# Patient Record
Sex: Male | Born: 1955 | Race: White | Hispanic: No | Marital: Married | Smoking: Never smoker
Health system: Southern US, Community
[De-identification: ages and names within clinical notes are randomized; demographics above are authoritative.]

## PROBLEM LIST (undated history)

## (undated) DIAGNOSIS — E785 Hyperlipidemia, unspecified: Secondary | ICD-10-CM

## (undated) DIAGNOSIS — J45909 Unspecified asthma, uncomplicated: Secondary | ICD-10-CM

## (undated) DIAGNOSIS — I10 Essential (primary) hypertension: Secondary | ICD-10-CM

## (undated) DIAGNOSIS — R7303 Prediabetes: Secondary | ICD-10-CM

## (undated) DIAGNOSIS — R519 Headache, unspecified: Secondary | ICD-10-CM

## (undated) DIAGNOSIS — E559 Vitamin D deficiency, unspecified: Secondary | ICD-10-CM

## (undated) HISTORY — PX: ORIF ACETABULAR FRACTURE: SHX5029

## (undated) HISTORY — DX: Unspecified asthma, uncomplicated: J45.909

## (undated) HISTORY — DX: Essential (primary) hypertension: I10

## (undated) HISTORY — PX: KNEE SURGERY: SHX244

## (undated) HISTORY — PX: ESOPHAGOGASTRODUODENOSCOPY: SHX1529

## (undated) HISTORY — DX: Vitamin D deficiency, unspecified: E55.9

## (undated) HISTORY — DX: Hyperlipidemia, unspecified: E78.5

## (undated) HISTORY — DX: Prediabetes: R73.03

---

## 1998-05-08 ENCOUNTER — Encounter: Payer: Self-pay | Admitting: Internal Medicine

## 1998-05-08 ENCOUNTER — Ambulatory Visit (HOSPITAL_COMMUNITY): Admission: RE | Admit: 1998-05-08 | Discharge: 1998-05-08 | Payer: Self-pay | Admitting: Internal Medicine

## 1999-07-31 ENCOUNTER — Encounter: Payer: Self-pay | Admitting: Internal Medicine

## 1999-07-31 ENCOUNTER — Ambulatory Visit (HOSPITAL_COMMUNITY): Admission: RE | Admit: 1999-07-31 | Discharge: 1999-07-31 | Payer: Self-pay | Admitting: Internal Medicine

## 1999-08-04 ENCOUNTER — Encounter: Payer: Self-pay | Admitting: Internal Medicine

## 1999-08-04 ENCOUNTER — Ambulatory Visit (HOSPITAL_COMMUNITY): Admission: RE | Admit: 1999-08-04 | Discharge: 1999-08-04 | Payer: Self-pay | Admitting: Internal Medicine

## 2001-01-12 ENCOUNTER — Encounter: Admission: RE | Admit: 2001-01-12 | Discharge: 2001-01-12 | Payer: Self-pay | Admitting: Otolaryngology

## 2001-01-12 ENCOUNTER — Encounter: Payer: Self-pay | Admitting: Otolaryngology

## 2003-08-07 ENCOUNTER — Ambulatory Visit (HOSPITAL_COMMUNITY): Admission: RE | Admit: 2003-08-07 | Discharge: 2003-08-07 | Payer: Self-pay | Admitting: Internal Medicine

## 2003-08-15 ENCOUNTER — Ambulatory Visit (HOSPITAL_COMMUNITY): Admission: RE | Admit: 2003-08-15 | Discharge: 2003-08-15 | Payer: Self-pay | Admitting: Internal Medicine

## 2003-09-05 ENCOUNTER — Ambulatory Visit (HOSPITAL_COMMUNITY): Admission: RE | Admit: 2003-09-05 | Discharge: 2003-09-05 | Payer: Self-pay | Admitting: Cardiology

## 2007-05-20 ENCOUNTER — Ambulatory Visit: Payer: Self-pay | Admitting: Gastroenterology

## 2007-06-03 ENCOUNTER — Ambulatory Visit: Payer: Self-pay | Admitting: Gastroenterology

## 2007-06-03 LAB — HM COLONOSCOPY

## 2010-08-22 NOTE — Cardiovascular Report (Signed)
NAME:  Gerald [name], Gerald [name]                         ACCOUNT NO.:  [id number]   MEDICAL RECORD NO.:  [id number]                   PATIENT TYPE:  OIB   LOCATION:  2858                                 FACILITY:  [facility]   PHYSICIAN:  Thereasa Solo. Little, M.D.              DATE OF BIRTH:  09/27/1955   DATE OF PROCEDURE:  09/05/2003  DATE OF DISCHARGE:  09/05/2003                              CARDIAC CATHETERIZATION   PROCEDURES PERFORMED:   CARDIOLOGIST:  Gaspar Garbe B. Little, M.D.   INDICATIONS FOR TEST:  Mr. Missey is a 55 year old male who has had  episodes of shortness of breath and has had episodes of chest discomfort.  They seem to be more noticeable when he is less active.  He has had no  evidence of any arrhythmias.  The night prior to this evaluation he woke up  and had a 45-minute episode of severe chest discomfort with shortness of  breath.   A nuclear study was performed on Aug 28, 2003 that showed a 60% ejection  fraction and lateral wall ischemia.  The patient, however, on the treadmill  was able to exercise for a total of 10 minutes 30 seconds.   EQUIPMENT:  Five French Judkins Configuration catheters.   CONTRAST MATERIAL:  Total contrast; 100 mL.   COMPLICATIONS:  None.   DESCRIPTION OF PROCEDURE:  The patient was prepped and draped in the usual  sterile fashion exposing the right groin.  Following local anesthesia with  1% Xylocaine the Seldinger technique was employed and a 5 [place] introducer  sheath was placed into the right femoral artery.  Left and right coronary  arteriography and ventriculography in the RAO projection was performed.  A  distal aortogram was performed because of his history of hypertension.   RESULTS:   I HEMODYNAMIC MONITORING:  Central aortic pressure was 101/70.  Left  ventricular pressure was 109/16 and there was a 7 mm gradient noted at the  time of pullback.   II VENTRICULOGRAPHY:  Ventriculography in the RAO projection revealed normal  left  ventricular systolic function.  No mitral regurgitation.  Ejection  fraction was greater than 55%.   III DISTAL AORTOGRAM:  Distal aortogram done just above the levels of the  renal arteries showed no evidence of abdominal aortic aneurysm and no  evidence of renal artery stenosis.  The lumen of the distal aorta appeared  to be smooth.   IV CORONARY ARTERIOGRAPHY:  On fluoroscopy there was mild-to-moderate  calcification seen in the proximal circumflex and LAD.   1. Left Main:  Normal.   1. LAD:  The LAD crossed the apex of the heart and gave rise to a first     diagonal branch.  Both the LAD and the diagonal system were free of     disease.   1. Circumflex:  The circumflex was a large vessel at about 4 mm.  It gave  rise to three OM vessels, all of which were free of disease.   1. Right Coronary Artery:  The right coronary artery gave rise to a PDA and     a s,mall posterolateral vessel, all of which were free of disease.   CONCLUSION:  1. Normal left ventricular systolic function.  2. No renal artery stenosis.  3. No evidence of occlusive coronary disease; however, there was mild-to-     moderate calcification in the proximal portion of the circumflex and left     anterior descending.   DISCUSSION:  At this point I cannot explain his symptoms based on his  coronary anatomy.  I will defer back to Dr. Oneta Rack and would suggest a GI  evaluation and perhaps even PFTs; however, the way he describes some of his  symptoms may imply that he is having panic attacks.                                               Thereasa Solo. Little, M.D.    ABL/MEDQ  D:  09/05/2003  T:  09/06/2003  Job:  [id number]   cc:   Catheterization Laboratory   Lucky Cowboy, M.D.  [address]  [place], [place] [address]  Fax: [phone]

## 2013-02-09 ENCOUNTER — Other Ambulatory Visit: Payer: Self-pay | Admitting: Internal Medicine

## 2013-04-05 ENCOUNTER — Encounter: Payer: Self-pay | Admitting: Internal Medicine

## 2013-04-11 ENCOUNTER — Encounter: Payer: Self-pay | Admitting: Physician Assistant

## 2013-04-11 ENCOUNTER — Ambulatory Visit (INDEPENDENT_AMBULATORY_CARE_PROVIDER_SITE_OTHER): Payer: 59 | Admitting: Physician Assistant

## 2013-04-11 VITALS — BP 122/72 | HR 76 | Temp 98.2°F | Resp 16 | Ht 68.0 in | Wt 195.0 lb

## 2013-04-11 DIAGNOSIS — Z79899 Other long term (current) drug therapy: Secondary | ICD-10-CM

## 2013-04-11 DIAGNOSIS — I10 Essential (primary) hypertension: Secondary | ICD-10-CM | POA: Insufficient documentation

## 2013-04-11 DIAGNOSIS — R7303 Prediabetes: Secondary | ICD-10-CM

## 2013-04-11 DIAGNOSIS — E291 Testicular hypofunction: Secondary | ICD-10-CM

## 2013-04-11 DIAGNOSIS — E785 Hyperlipidemia, unspecified: Secondary | ICD-10-CM

## 2013-04-11 DIAGNOSIS — E559 Vitamin D deficiency, unspecified: Secondary | ICD-10-CM

## 2013-04-11 DIAGNOSIS — R7309 Other abnormal glucose: Secondary | ICD-10-CM

## 2013-04-11 DIAGNOSIS — E782 Mixed hyperlipidemia: Secondary | ICD-10-CM | POA: Insufficient documentation

## 2013-04-11 LAB — LIPID PANEL
Cholesterol: 144 mg/dL (ref 0–200)
HDL: 46 mg/dL (ref >39)
LDL Cholesterol: 75 mg/dL (ref 0–99)
Total CHOL/HDL Ratio: 3.1 Ratio
Triglycerides: 116 mg/dL (ref <150)
VLDL: 23 mg/dL (ref 0–40)

## 2013-04-11 LAB — MAGNESIUM: Magnesium: 2.3 mg/dL (ref 1.5–2.5)

## 2013-04-11 LAB — BASIC METABOLIC PANEL WITH GFR
BUN: 22 mg/dL (ref 6–23)
CO2: 25 mEq/L (ref 19–32)
Calcium: 9.5 mg/dL (ref 8.4–10.5)
Chloride: 102 mEq/L (ref 96–112)
Creat: 1.15 mg/dL (ref 0.50–1.35)
GFR, Est African American: 82 mL/min
GFR, Est Non African American: 71 mL/min
Glucose, Bld: 87 mg/dL (ref 70–99)
Potassium: 4.7 mEq/L (ref 3.5–5.3)
Sodium: 138 mEq/L (ref 135–145)

## 2013-04-11 LAB — HEPATIC FUNCTION PANEL
ALT: 40 U/L (ref 0–53)
AST: 39 U/L — ABNORMAL HIGH (ref 0–37)
Albumin: 4.8 g/dL (ref 3.5–5.2)
Alkaline Phosphatase: 78 U/L (ref 39–117)
Bilirubin, Direct: 0.1 mg/dL (ref 0.0–0.3)
Indirect Bilirubin: 0.3 mg/dL (ref 0.0–0.9)
Total Bilirubin: 0.4 mg/dL (ref 0.3–1.2)
Total Protein: 6.9 g/dL (ref 6.0–8.3)

## 2013-04-11 NOTE — Progress Notes (Signed)
HPI Patient presents for 3 month follow up with hypertension, hyperlipidemia, prediabetes and vitamin D. Patient's blood pressure has been controlled at home, today their BP is BP: 122/72 mmHg  Patient denies chest pain, shortness of breath, dizziness.  Patient's cholesterol is diet controlled. In addition they are on crestor 40  and denies myalgias. The cholesterol last visit was LDL 74.  The patient has been working on diet and exercise for prediabetes, he goes to the [facility] and runs, and denies changes in vision, polys, and paresthesias. A1C 5.7 (5.4)  Patient is on Vitamin D supplement.  Vitamin D was 91 PSA was elevated and he needs a repeat PSA next visit.  Uses miralax for constipation.  Current Medications:  Current Outpatient Prescriptions on File Prior to Visit  Medication Sig Dispense Refill  . Cholecalciferol (VITAMIN D-3) 5000 UNITS TABS Take 5,000 Units by mouth daily.      Marland Kitchen CIALIS 5 MG tablet TAKE 1 TABLET DAILY FOR    PROSTATISM  90 tablet  3  . enalapril (VASOTEC) 20 MG tablet Take 20 mg by mouth daily.      . Flaxseed, Linseed, (FLAX SEED OIL PO) Take by mouth.      . magnesium 30 MG tablet Take 30 mg by mouth 2 (two) times daily.      . meloxicam (MOBIC) 15 MG tablet Take 15 mg by mouth as needed for pain.      . rosuvastatin (CRESTOR) 40 MG tablet Take 20 mg by mouth daily.      . Testosterone (FORTESTA TD) Place onto the skin daily.      . vitamin B-12 (CYANOCOBALAMIN) 100 MCG tablet Take 100 mcg by mouth daily.       No current facility-administered medications on file prior to visit.   Medical History:  Past Medical History  Diagnosis Date  . Hypertension   . Hyperlipidemia   . Vitamin D deficiency   . Pre-diabetes   . Hypogonadism, male    Allergies: No Known Allergies  ROS Constitutional: Denies fever, chills, headaches, insomnia, fatigue, night sweats Eyes: Denies redness, blurred vision, diplopia, discharge, itchy, watery eyes.  ENT: Denies congestion,  post nasal drip, sore throat, earache, dental pain, Tinnitus, Vertigo, Sinus pain, snoring.  Cardio: Denies chest pain, palpitations, irregular heartbeat, dyspnea, diaphoresis, orthopnea, PND, claudication, edema Respiratory: denies cough, shortness of breath, wheezing.  Gastrointestinal: + constipation Denies dysphagia, heartburn, AB pain/ cramps, N/V, diarrhea,  hematemesis, melena, hematochezia,  hemorrhoids Genitourinary: Denies dysuria, frequency, urgency, nocturia, hesitancy, discharge, hematuria, flank pain Musculoskeletal: Denies myalgia, stiffness, pain, swelling and strain/sprain. Skin: Denies pruritis, rash, changing in skin lesion Neuro: Denies Weakness, tremor, incoordination, spasms, pain Psychiatric: Denies confusion, memory loss, sensory loss Endocrine: Denies change in weight, skin, hair change, nocturia Diabetic Polys, Denies visual blurring, hyper /hypo glycemic episodes, and paresthesia, Heme/Lymph: Denies Excessive bleeding, bruising, enlarged lymph nodes  Family history- Review and unchanged Social history- Review and unchanged Physical Exam: Filed Vitals:   04/11/13 1358  BP: 122/72  Pulse: 76  Temp: 98.2 F (36.8 C)  Resp: 16   Filed Weights   04/11/13 1358  Weight: 195 lb (88.451 kg)   General Appearance: Well nourished, in no apparent distress. Eyes: PERRLA, EOMs, conjunctiva no swelling or erythema Sinuses: No Frontal/maxillary tenderness ENT/Mouth: Ext aud canals clear, TMs without erythema, bulging. No erythema, swelling, or exudate on post pharynx.  Tonsils not swollen or erythematous. Hearing normal.  Neck: Supple, thyroid normal.  Respiratory: Respiratory effort normal, BS  equal bilaterally without rales, rhonchi, wheezing or stridor.  Cardio: RRR with no MRGs. Brisk peripheral pulses without edema.  Abdomen: Soft, + BS.  Non tender, no guarding, rebound, hernias, masses. Lymphatics: Non tender without lymphadenopathy.  Musculoskeletal: Full ROM,  5/5 strength, normal gait.  Skin: Warm, dry without rashes, lesions, ecchymosis.  Neuro: Cranial nerves intact. Normal muscle tone, no cerebellar symptoms. Sensation intact.  Psych: Awake and oriented X 3, normal affect, Insight and Judgment appropriate.   Assessment and Plan:  Hypertension: Continue medication, monitor blood pressure at home.  Continue DASH diet. Cholesterol: Continue diet and exercise. Check cholesterol.  Pre-diabetes-Continue diet and exercise. Check A1C Vitamin D Def- check level and continue medications.  Elevated PSA- check PSA next visit, denies LUTs symptoms Constipation- add benefiber, increase water.   Continue diet and meds as discussed. Further disposition pending results of labs.  Vicie Mutters 2:07 PM

## 2013-04-11 NOTE — Patient Instructions (Signed)
Add benefiber or generic equivalent for constipation. 1-2 TBSP daily.     Bad carbs also include fruit juice, alcohol, and sweet tea. These are empty calories that do not signal to your brain that you are full.   Please remember the good carbs are still carbs which convert into sugar. So please measure them out no more than 1/2-1 cup of rice, oatmeal, pasta, and beans.  Veggies are however free foods! Pile them on.   I like lean protein at every meal such as chicken, [place], pork chops, cottage cheese, etc. Just do not fry these meats and please center your meal around vegetable, the meats should be a side dish.   No all fruit is created equal. Please see the list below, the fruit at the bottom is higher in sugars than the fruit at the top

## 2013-04-12 LAB — CBC WITH DIFFERENTIAL/PLATELET
Basophils Absolute: 0 10*3/uL (ref 0.0–0.1)
Basophils Relative: 1 % (ref 0–1)
Eosinophils Absolute: 0.1 10*3/uL (ref 0.0–0.7)
Eosinophils Relative: 1 % (ref 0–5)
HCT: 46.5 % (ref 39.0–52.0)
Hemoglobin: 16 g/dL (ref 13.0–17.0)
Lymphocytes Relative: 12 % (ref 12–46)
Lymphs Abs: 1.1 10*3/uL (ref 0.7–4.0)
MCH: 29 pg (ref 26.0–34.0)
MCHC: 34.4 g/dL (ref 30.0–36.0)
MCV: 84.2 fL (ref 78.0–100.0)
Monocytes Absolute: 0.6 10*3/uL (ref 0.1–1.0)
Monocytes Relative: 7 % (ref 3–12)
Neutro Abs: 6.9 10*3/uL (ref 1.7–7.7)
Neutrophils Relative %: 79 % — ABNORMAL HIGH (ref 43–77)
Platelets: 296 10*3/uL (ref 150–400)
RBC: 5.52 MIL/uL (ref 4.22–5.81)
RDW: 14.2 % (ref 11.5–15.5)
WBC: 8.8 10*3/uL (ref 4.0–10.5)

## 2013-04-12 LAB — VITAMIN D 25 HYDROXY (VIT D DEFICIENCY, FRACTURES): Vit D, 25-Hydroxy: 85 ng/mL (ref 30–89)

## 2013-04-12 LAB — TSH: TSH: 1.362 u[IU]/mL (ref 0.350–4.500)

## 2013-04-12 LAB — HEMOGLOBIN A1C
Hgb A1c MFr Bld: 5.6 % (ref <5.7)
Mean Plasma Glucose: 114 mg/dL (ref <117)

## 2013-05-24 ENCOUNTER — Encounter: Payer: Self-pay | Admitting: Internal Medicine

## 2013-05-24 ENCOUNTER — Ambulatory Visit (INDEPENDENT_AMBULATORY_CARE_PROVIDER_SITE_OTHER): Payer: 59 | Admitting: Internal Medicine

## 2013-05-24 VITALS — BP 130/82 | HR 48 | Temp 98.4°F | Resp 18 | Wt 198.2 lb

## 2013-05-24 DIAGNOSIS — Z79899 Other long term (current) drug therapy: Secondary | ICD-10-CM

## 2013-05-24 DIAGNOSIS — M779 Enthesopathy, unspecified: Principal | ICD-10-CM

## 2013-05-24 DIAGNOSIS — D2110 Benign neoplasm of connective and other soft tissue of unspecified upper limb, including shoulder: Secondary | ICD-10-CM

## 2013-05-24 DIAGNOSIS — M65839 Other synovitis and tenosynovitis, unspecified forearm: Secondary | ICD-10-CM

## 2013-05-24 DIAGNOSIS — Z7689 Persons encountering health services in other specified circumstances: Secondary | ICD-10-CM | POA: Insufficient documentation

## 2013-05-24 DIAGNOSIS — M778 Other enthesopathies, not elsewhere classified: Secondary | ICD-10-CM

## 2013-05-24 DIAGNOSIS — D2111 Benign neoplasm of connective and other soft tissue of right upper limb, including shoulder: Secondary | ICD-10-CM

## 2013-05-24 DIAGNOSIS — M65849 Other synovitis and tenosynovitis, unspecified hand: Secondary | ICD-10-CM

## 2013-05-24 MED ORDER — PREDNISONE 20 MG PO TABS: 20.0000 mg | ORAL_TABLET | ORAL | Status: DC

## 2013-05-24 NOTE — Progress Notes (Signed)
   Subjective:    Patient ID: Gerald [name], male    DOB: Mar 16, 1956, 58 y.o.   MRN: [id number]  HPI Pt c/o soreness of the Rt ring finger for 1-2 weeks. Does have very remote non displaced fx at the base of or proximal finger which is not tender.  Review of Systems Musculo-systems review otherwise negative.  Objective:   Physical Exam  Focused exam of the Rt ring finger finds normal flexion-extension with slight tenderness over the distal dorsal finger.there is also an approx. 10 x 10 x 4 mm soft to firm mobile non tender lesion along the medial proximal Rt 4th finger base.  Assessment & Plan:  1. Tendonitis of finger  - predniSONE (DELTASONE) 20 MG tablet; Take 1 tablet (20 mg total) by mouth See admin instructions. 1 tab 3 x day for 3 days, then 1 tab 2 x day for 3 days, then 1 tab 1 x day for 5 days  Dispense: 20 tablet; Refill: 0  2. Fibroma of finger of right hand  Advised if Sx's not resolve - can refer to orthopedist

## 2013-06-12 ENCOUNTER — Other Ambulatory Visit: Payer: Self-pay | Admitting: Internal Medicine

## 2013-07-12 ENCOUNTER — Ambulatory Visit (INDEPENDENT_AMBULATORY_CARE_PROVIDER_SITE_OTHER): Payer: 59 | Admitting: Internal Medicine

## 2013-07-12 ENCOUNTER — Encounter: Payer: Self-pay | Admitting: Internal Medicine

## 2013-07-12 VITALS — BP 102/68 | HR 64 | Temp 98.1°F | Resp 18 | Ht 68.75 in | Wt 194.4 lb

## 2013-07-12 DIAGNOSIS — E785 Hyperlipidemia, unspecified: Secondary | ICD-10-CM

## 2013-07-12 DIAGNOSIS — Z79899 Other long term (current) drug therapy: Secondary | ICD-10-CM

## 2013-07-12 DIAGNOSIS — R7309 Other abnormal glucose: Secondary | ICD-10-CM

## 2013-07-12 DIAGNOSIS — I10 Essential (primary) hypertension: Secondary | ICD-10-CM

## 2013-07-12 DIAGNOSIS — E559 Vitamin D deficiency, unspecified: Secondary | ICD-10-CM

## 2013-07-12 DIAGNOSIS — R7303 Prediabetes: Secondary | ICD-10-CM

## 2013-07-12 DIAGNOSIS — E291 Testicular hypofunction: Secondary | ICD-10-CM

## 2013-07-12 LAB — CBC WITH DIFFERENTIAL/PLATELET
Basophils Absolute: 0 10*3/uL (ref 0.0–0.1)
Basophils Relative: 1 % (ref 0–1)
Eosinophils Absolute: 0.1 10*3/uL (ref 0.0–0.7)
Eosinophils Relative: 3 % (ref 0–5)
HCT: 43.9 % (ref 39.0–52.0)
Hemoglobin: 15 g/dL (ref 13.0–17.0)
Lymphocytes Relative: 28 % (ref 12–46)
Lymphs Abs: 1.3 10*3/uL (ref 0.7–4.0)
MCH: 28.8 pg (ref 26.0–34.0)
MCHC: 34.2 g/dL (ref 30.0–36.0)
MCV: 84.3 fL (ref 78.0–100.0)
Monocytes Absolute: 0.5 10*3/uL (ref 0.1–1.0)
Monocytes Relative: 11 % (ref 3–12)
Neutro Abs: 2.7 10*3/uL (ref 1.7–7.7)
Neutrophils Relative %: 57 % (ref 43–77)
Platelets: 304 10*3/uL (ref 150–400)
RBC: 5.21 MIL/uL (ref 4.22–5.81)
RDW: 14.3 % (ref 11.5–15.5)
WBC: 4.7 10*3/uL (ref 4.0–10.5)

## 2013-07-12 LAB — HEMOGLOBIN A1C
Hgb A1c MFr Bld: 5.8 % — ABNORMAL HIGH (ref <5.7)
Mean Plasma Glucose: 120 mg/dL — ABNORMAL HIGH (ref <117)

## 2013-07-12 NOTE — Patient Instructions (Signed)

## 2013-07-12 NOTE — Progress Notes (Signed)
Patient ID: Gerald [name], male   DOB: 1955-07-08, 58 y.o.   MRN: [id number]    This very nice 58 y.o. [name] presents for 3 month follow up with Hypertension, Hyperlipidemia, Pre-Diabetes, Testosterone and Vitamin D Deficiency.    HTN predates since 1999. BP has been controlled at home. Today's BP: 102/68 mmHg. Periodic BP checks at [facility] range about 125/80. Patient had a negative Ht Cath in 2005 after a [place]. Patient denies any cardiac type chest pain, palpitations, dyspnea/orthopnea/PND, dizziness, claudication, or dependent edema.   Hyperlipidemia is controlled with diet & meds. Last Cholesterol was 144, Triglycerides were 116, HDL 46 and LDL 75 in Jan 2015 - at goal. Patient denies myalgias or other med SE's.    Also, the patient has history of PreDiabetes since Apr 2011  with last A1c of  5.6% in Jan 2015. Patient denies any symptoms of reactive hypoglycemia, diabetic polys, paresthesias or visual blurring.   Other problems include Testosterone Deficiency with patient reporting improved sense of well being on replacement therapy. Further, Patient has history of Vitamin D Deficiency with vitamin D of 91 in Oct 2014. Patient supplements vitamin D without any suspected side-effects.  Medication Sig  . chlorhexidine (PERIDEX) 0.12 % solution   . Cholecalciferol (VITAMIN D-3) 5000 UNITS TABS Take 5,000 Units by mouth daily.  Marland Kitchen CIALIS 5 MG tablet TAKE 1 TABLET DAILY FOR    PROSTATISM  . Flaxseed, Linseed, (FLAX SEED OIL PO) Take by mouth.  . magnesium 30 MG tablet Take 30 mg by mouth 2 (two) times daily.  . meloxicam (MOBIC) 15 MG tablet Take 15 mg by mouth as needed for pain.  . moexipril (UNIVASC) 15 MG tablet TAKE 1 TABLET DAILY FOR    BLOOD PRESSURE  . Omega-3 Fatty Acids (FISH OIL PO) Take by mouth daily.  . pregabalin (LYRICA) 200 MG capsule Take 200 mg by mouth 2 (two) times daily.  . rosuvastatin (CRESTOR) 40 MG tablet Take 20 mg by mouth daily.  .  Testosterone (FORTESTA TD) Place onto the skin daily.  . vitamin B-12 (CYANOCOBALAMIN) 100 MCG tablet Take 100 mcg by mouth daily.  Marland Kitchen ZOLMitriptan (ZOMIG) 2.5 MG tablet Take 2.5 mg by mouth once. Pt not sure of mg    No Known Allergies  PMHx:   Past Medical History  Diagnosis Date  . Hypertension   . Hyperlipidemia   . Vitamin D deficiency   . Pre-diabetes   . Hypogonadism, male     FHx:    Reviewed / unchanged  SHx:    Reviewed / unchanged   Systems Review: Constitutional: Denies fever, chills, wt changes, headaches, insomnia, fatigue, night sweats, change in appetite. Eyes: Denies redness, blurred vision, diplopia, discharge, itchy, watery eyes.  ENT: Denies discharge, congestion, post nasal drip, epistaxis, sore throat, earache, hearing loss, dental pain, tinnitus, vertigo, sinus pain, snoring.  CV: Denies chest pain, palpitations, irregular heartbeat, syncope, dyspnea, diaphoresis, orthopnea, PND, claudication, edema. Respiratory: denies cough, dyspnea, DOE, pleurisy, hoarseness, laryngitis, wheezing.  Gastrointestinal: Denies dysphagia, odynophagia, heartburn, reflux, water brash, abdominal pain or cramps, nausea, vomiting, bloating, diarrhea, constipation, hematemesis, melena, hematochezia,  or hemorrhoids. Genitourinary: Denies dysuria, frequency, urgency, nocturia, hesitancy, discharge, hematuria, flank pain. Musculoskeletal: Denies arthralgias, myalgias, stiffness, jt. swelling, pain, limp, strain/sprain.  Skin: Denies pruritus, rash, hives, warts, acne, eczema, change in skin lesion(s). Neuro: No weakness, tremor, incoordination, spasms, paresthesia, or pain. Psychiatric: Denies confusion, memory loss, or sensory loss. Endo: Denies change in weight,  skin, hair change.  Heme/Lymph: No excessive bleeding, bruising, orenlarged lymph nodes.   Exam:  BP 102/68  Pulse 64  Temp(Src) 98.1 F (36.7 C) (Temporal)  Resp 18  Ht 5' 8.75" (1.746 m)  Wt 194 lb 6.4 oz (88.179  kg)  BMI 28.93 kg/m2  Appears well nourished - in no distress. Eyes: PERRLA, EOMs, conjunctiva no swelling or erythema. Sinuses: No frontal/maxillary tenderness ENT/Mouth: EAC's clear, TM's nl w/o erythema, bulging. Nares clear w/o erythema, swelling, exudates. Oropharynx clear without erythema or exudates. Oral hygiene is good. Tongue normal, non obstructing. Hearing intact.  Neck: Supple. Thyroid nl. Car 2+/2+ without bruits, nodes or JVD. Chest: Respirations nl with BS clear & equal w/o rales, rhonchi, wheezing or stridor.  Cor: Heart sounds normal w/ regular rate and rhythm without sig. murmurs, gallops, clicks, or rubs. Peripheral pulses normal and equal  without edema.  Abdomen: Soft & bowel sounds normal. Non-tender w/o guarding, rebound, hernias, masses, or organomegaly.  Lymphatics: Unremarkable.  Musculoskeletal: Full ROM all peripheral extremities, joint stability, 5/5 strength, and normal gait.  Skin: Warm, dry without exposed rashes, lesions, ecchymosis apparent.  Neuro: Cranial nerves intact, reflexes equal bilaterally. Sensory-motor testing grossly intact. Tendon reflexes grossly intact.  Pysch: Alert & oriented x 3. Insight and judgement nl & appropriate. No ideations.  Assessment and Plan:  1. Hypertension - Continue monitor blood pressure at home. Continue diet/meds same.  2. Hyperlipidemia - Continue diet/meds, exercise,& lifestyle modifications. Continue monitor periodic cholesterol/liver & renal functions   3. Pre-diabetes - Continue diet, exercise, lifestyle modifications. Monitor appropriate labs.  4. Vitamin D Deficiency - Continue supplementation.  5. Testosterone Deficiency - continue supplementation  Recommended regular exercise, BP monitoring, weight control, and discussed med and SE's. Recommended labs to assess and monitor clinical status. Further disposition pending results of labs.

## 2013-07-13 LAB — VITAMIN D 25 HYDROXY (VIT D DEFICIENCY, FRACTURES): Vit D, 25-Hydroxy: 77 ng/mL (ref 30–89)

## 2013-07-13 LAB — INSULIN, FASTING: Insulin fasting, serum: 14 u[IU]/mL (ref 3–28)

## 2013-07-13 LAB — MAGNESIUM: Magnesium: 2.1 mg/dL (ref 1.5–2.5)

## 2013-07-13 LAB — HEPATIC FUNCTION PANEL
ALT: 25 U/L (ref 0–53)
AST: 26 U/L (ref 0–37)
Albumin: 4.8 g/dL (ref 3.5–5.2)
Alkaline Phosphatase: 78 U/L (ref 39–117)
Bilirubin, Direct: 0.1 mg/dL (ref 0.0–0.3)
Indirect Bilirubin: 0.4 mg/dL (ref 0.2–1.2)
Total Bilirubin: 0.5 mg/dL (ref 0.2–1.2)
Total Protein: 6.9 g/dL (ref 6.0–8.3)

## 2013-07-13 LAB — BASIC METABOLIC PANEL WITH GFR
BUN: 22 mg/dL (ref 6–23)
CO2: 27 mEq/L (ref 19–32)
Calcium: 9.9 mg/dL (ref 8.4–10.5)
Chloride: 102 mEq/L (ref 96–112)
Creat: 1.01 mg/dL (ref 0.50–1.35)
GFR, Est African American: >89 mL/min
GFR, Est Non African American: 82 mL/min
Glucose, Bld: 77 mg/dL (ref 70–99)
Potassium: 4.5 mEq/L (ref 3.5–5.3)
Sodium: 140 mEq/L (ref 135–145)

## 2013-07-13 LAB — TSH: TSH: 1.088 u[IU]/mL (ref 0.350–4.500)

## 2013-07-13 LAB — LIPID PANEL
Cholesterol: 151 mg/dL (ref 0–200)
HDL: 45 mg/dL (ref >39)
LDL Cholesterol: 81 mg/dL (ref 0–99)
Total CHOL/HDL Ratio: 3.4 Ratio
Triglycerides: 127 mg/dL (ref <150)
VLDL: 25 mg/dL (ref 0–40)

## 2013-07-13 LAB — TESTOSTERONE: Testosterone: 448 ng/dL (ref 300–890)

## 2013-10-16 ENCOUNTER — Encounter: Payer: Self-pay | Admitting: Emergency Medicine

## 2013-10-16 ENCOUNTER — Ambulatory Visit (INDEPENDENT_AMBULATORY_CARE_PROVIDER_SITE_OTHER): Payer: 59 | Admitting: Emergency Medicine

## 2013-10-16 VITALS — BP 104/62 | HR 68 | Temp 98.2°F | Resp 16 | Ht 68.75 in | Wt 196.0 lb

## 2013-10-16 DIAGNOSIS — E782 Mixed hyperlipidemia: Secondary | ICD-10-CM

## 2013-10-16 DIAGNOSIS — K5900 Constipation, unspecified: Secondary | ICD-10-CM

## 2013-10-16 DIAGNOSIS — R7309 Other abnormal glucose: Secondary | ICD-10-CM

## 2013-10-16 DIAGNOSIS — I10 Essential (primary) hypertension: Secondary | ICD-10-CM

## 2013-10-16 LAB — HEPATIC FUNCTION PANEL
ALT: 24 U/L (ref 0–53)
AST: 23 U/L (ref 0–37)
Albumin: 4.5 g/dL (ref 3.5–5.2)
Alkaline Phosphatase: 71 U/L (ref 39–117)
Bilirubin, Direct: 0.1 mg/dL (ref 0.0–0.3)
Indirect Bilirubin: 0.4 mg/dL (ref 0.2–1.2)
Total Bilirubin: 0.5 mg/dL (ref 0.2–1.2)
Total Protein: 6.7 g/dL (ref 6.0–8.3)

## 2013-10-16 LAB — BASIC METABOLIC PANEL WITH GFR
BUN: 17 mg/dL (ref 6–23)
CO2: 27 mEq/L (ref 19–32)
Calcium: 9.5 mg/dL (ref 8.4–10.5)
Chloride: 104 mEq/L (ref 96–112)
Creat: 1.06 mg/dL (ref 0.50–1.35)
GFR, Est African American: >89 mL/min
GFR, Est Non African American: 78 mL/min
Glucose, Bld: 74 mg/dL (ref 70–99)
Potassium: 4.6 mEq/L (ref 3.5–5.3)
Sodium: 138 mEq/L (ref 135–145)

## 2013-10-16 LAB — CBC WITH DIFFERENTIAL/PLATELET
Basophils Absolute: 0 10*3/uL (ref 0.0–0.1)
Basophils Relative: 1 % (ref 0–1)
Eosinophils Absolute: 0.2 10*3/uL (ref 0.0–0.7)
Eosinophils Relative: 4 % (ref 0–5)
HCT: 47 % (ref 39.0–52.0)
Hemoglobin: 15.9 g/dL (ref 13.0–17.0)
Lymphocytes Relative: 29 % (ref 12–46)
Lymphs Abs: 1.3 10*3/uL (ref 0.7–4.0)
MCH: 28.9 pg (ref 26.0–34.0)
MCHC: 33.8 g/dL (ref 30.0–36.0)
MCV: 85.5 fL (ref 78.0–100.0)
Monocytes Absolute: 0.6 10*3/uL (ref 0.1–1.0)
Monocytes Relative: 13 % — ABNORMAL HIGH (ref 3–12)
Neutro Abs: 2.3 10*3/uL (ref 1.7–7.7)
Neutrophils Relative %: 53 % (ref 43–77)
Platelets: 244 10*3/uL (ref 150–400)
RBC: 5.5 MIL/uL (ref 4.22–5.81)
RDW: 13.7 % (ref 11.5–15.5)
WBC: 4.4 10*3/uL (ref 4.0–10.5)

## 2013-10-16 LAB — LIPID PANEL
Cholesterol: 129 mg/dL (ref 0–200)
HDL: 48 mg/dL (ref >39)
LDL Cholesterol: 60 mg/dL (ref 0–99)
Total CHOL/HDL Ratio: 2.7 Ratio
Triglycerides: 103 mg/dL (ref <150)
VLDL: 21 mg/dL (ref 0–40)

## 2013-10-16 LAB — HEMOGLOBIN A1C
Hgb A1c MFr Bld: 5.5 % (ref <5.7)
Mean Plasma Glucose: 111 mg/dL (ref <117)

## 2013-10-16 NOTE — Progress Notes (Signed)
Subjective:    Patient ID: Gerald [name], male    DOB: [birth year], 57 y.o.   MRN: [id number]  HPI Comments: 58 yo presents for 3 month F/U for HTN, Cholesterol, Pre-Dm, D. Deficient. He is eating healthy. He is exercising routinely. He notes BP is good at home.    He notes constipation was initially better with switching to Benefiber but notes it is worse with travel. He denies any HX of IBS or bowel problems otherwise.  WBC             4.7   07/12/2013 HGB            15.0   07/12/2013 HCT            43.9   07/12/2013 PLT             304   07/12/2013 GLUCOSE          77   07/12/2013 CHOL            151   07/12/2013 TRIG            127   07/12/2013 HDL              45   07/12/2013 LDLCALC          81   07/12/2013 ALT              25   07/12/2013 AST              26   07/12/2013 NA              140   07/12/2013 K               4.5   07/12/2013 CL              102   07/12/2013 CREATININE     1.01   07/12/2013 BUN              22   07/12/2013 CO2              27   07/12/2013 TSH           1.088   07/12/2013 HGBA1C          5.8   07/12/2013  Hyperlipidemia      Medication List       This list is accurate as of: 10/16/13  9:13 AM.  Always use your most recent med list.               ALPRAZolam 0.5 MG tablet  Commonly known as:  XANAX     chlorhexidine 0.12 % solution  Commonly known as:  PERIDEX     CIALIS 5 MG tablet  Generic drug:  tadalafil  TAKE 1 TABLET DAILY FOR    PROSTATISM     clobetasol cream 0.05 %  Commonly known as:  TEMOVATE     FISH OIL PO  Take by mouth daily.     FLAX SEED OIL PO  Take by mouth.     FORTESTA TD  Place onto the skin daily.     magnesium 30 MG tablet  Take 30 mg by mouth 2 (two) times daily.     meloxicam 15 MG tablet  Commonly known as:  MOBIC  Take 15 mg by mouth as needed for pain.     moexipril 15 MG tablet  Commonly known as:  UNIVASC  TAKE 1 TABLET DAILY FOR  BLOOD PRESSURE     pregabalin 200 MG capsule  Commonly known as:  LYRICA  Take 200  mg by mouth 2 (two) times daily.     rosuvastatin 40 MG tablet  Commonly known as:  CRESTOR  Take 20 mg by mouth daily.     vitamin B-12 100 MCG tablet  Commonly known as:  CYANOCOBALAMIN  Take 100 mcg by mouth daily.     Vitamin D-3 5000 UNITS Tabs  Take 5,000 Units by mouth daily.     ZOLMitriptan 2.5 MG tablet  Commonly known as:  ZOMIG  Take 2.5 mg by mouth once. Pt not sure of mg       No Known Allergies  Past Medical History  Diagnosis Date  . Hypertension   . Hyperlipidemia   . Vitamin D deficiency   . Pre-diabetes   . Hypogonadism, male      Review of Systems  Gastrointestinal: Positive for constipation.  All other systems reviewed and are negative.  BP 104/62  Pulse 68  Temp(Src) 98.2 F (36.8 C) (Temporal)  Resp 16  Ht 5' 8.75" (1.746 m)  Wt 196 lb (88.905 kg)  BMI 29.16 kg/m2     Objective:   Physical Exam  Nursing note and vitals reviewed. Constitutional: He is oriented to person, place, and time. He appears well-developed and well-nourished.  HENT:  Head: Normocephalic and atraumatic.  Right Ear: External ear normal.  Left Ear: External ear normal.  Nose: Nose normal.  Eyes: Conjunctivae and EOM are normal.  Neck: Normal range of motion. Neck supple. No thyromegaly present.  Cardiovascular: Normal rate, regular rhythm, normal heart sounds and intact distal pulses.   Pulmonary/Chest: Effort normal and breath sounds normal.  Abdominal: Soft. Bowel sounds are normal. He exhibits no distension and no mass. There is no tenderness. There is no rebound and no guarding.  Musculoskeletal: Normal range of motion. He exhibits no edema and no tenderness.  Lymphadenopathy:    He has no cervical adenopathy.  Neurological: He is alert and oriented to person, place, and time. No cranial nerve deficit. Coordination normal.  Skin: Skin is warm and dry.  Psychiatric: He has a normal mood and affect. His behavior is normal. Judgment and thought content normal.           Assessment & Plan:  1.  3 month F/U for HTN, Cholesterol, Pre-Dm, D. Deficient. Needs healthy diet, cardio QD and obtain healthy weight. Check Labs, Check BP if >130/80 call office   2. constipation- If no change with probiotic, consider RX constipation medication and verify next colonoscopy due date. SX [facility] given # 1 box

## 2013-10-16 NOTE — Patient Instructions (Signed)

## 2013-10-18 ENCOUNTER — Encounter: Payer: Self-pay | Admitting: Emergency Medicine

## 2013-10-18 ENCOUNTER — Other Ambulatory Visit: Payer: Self-pay | Admitting: Emergency Medicine

## 2013-10-26 ENCOUNTER — Other Ambulatory Visit: Payer: Self-pay | Admitting: *Deleted

## 2013-10-26 MED ORDER — RESTORA PO CAPS: ORAL_CAPSULE | ORAL | Status: DC

## 2013-11-30 ENCOUNTER — Other Ambulatory Visit: Payer: Self-pay | Admitting: *Deleted

## 2013-11-30 MED ORDER — ROSUVASTATIN CALCIUM 40 MG PO TABS: 40.0000 mg | ORAL_TABLET | Freq: Every day | ORAL | Status: DC

## 2014-01-07 NOTE — Progress Notes (Signed)
Patient ID: Gerald [name], male   DOB: 03/12/1956, 58 y.o.   MRN: [id number]  Annual Screening Comprehensive Examination  This very nice 58 y.o.[name] presents for complete physical.  Patient has been followed for HTN,  Prediabetes, Hyperlipidemia, Testosterone and Vitamin D Deficiency.   HTN first diagnosed in 1999. Patient's BP has been controlled at home.Today's BP: 108/80 mmHg. Patient did have a False Positive Cardiolite in 2005 vindicated by a normal heart cath. Patient does have CKD3 with GFR 69/ml/min. Patient denies any cardiac symptoms as chest pain, palpitations, shortness of breath, dizziness or ankle swelling.   Patient's hyperlipidemia is controlled with diet and medications. Patient denies myalgias or other medication SE's. Last lipids were Total Chol 129; HDL  48; LDL  60; Trig 103 on 10/16/2013.   Patient has prediabetes predating since 07/2009 and patient denies reactive hypoglycemic symptoms, visual blurring, diabetic polys or paresthesias. Last A1c was 5.5% on 10/16/2013.   Patient is on replacement Tx with Gel and reports improved sense of well being. In addition patient has been on testosterone replacement therapy with improved stamina and sense of well being. Finally, patient has history of Vitamin D Deficiency of    and last vitamin D was 77 on 07/12/2013.  Medication Sig  . ALPRAZolam (XANAX) 0.5 MG tablet   . chlorhexidine (PERIDEX) 0.12 % solution   . Cholecalciferol (VITAMIN D-3) 5000 UNITS TABS Take 5,000 Units by mouth daily.  Marland Kitchen CIALIS 5 MG tablet TAKE 1 TABLET DAILY FOR    PROSTATISM  . clobetasol cream (TEMOVATE) 0.05 %   . Flaxseed, Linseed, (FLAX SEED OIL PO) Take by mouth.  . magnesium 30 MG tablet Take 30 mg by mouth 2 (two) times daily.  . meloxicam (MOBIC) 15 MG tablet Take 15 mg by mouth as needed for pain.  . moexipril (UNIVASC) 15 MG tablet TAKE 1 TABLET DAILY FOR    BLOOD PRESSURE  . Omega-3 Fatty Acids (FISH OIL PO) Take by mouth daily.  . pregabalin  (LYRICA) 200 MG capsule Take 200 mg by mouth 2 (two) times daily.  . Probiotic Product (RESTORA) CAPS Take one pill by mouth once daily  . rosuvastatin (CRESTOR) 40 MG tablet Take 1 tablet (40 mg total) by mouth daily.  . Testosterone (FORTESTA TD) Place onto the skin daily.  . vitamin B-12100 MCG tablet Take 100 mcg by mouth daily.  Marland Kitchen ZOLMitriptan (ZOMIG) 2.5 MG tablet Take 2.5 mg by mouth once. Pt not sure of mg   No Known Allergies  Past Medical History  Diagnosis Date  . Hypertension   . Hyperlipidemia   . Vitamin D deficiency   . Pre-diabetes   . Hypogonadism, male    Past Surgical History  Procedure Laterality Date  . Knee surgery    . Esophagogastroduodenoscopy    . Orif acetabular fracture     Family History  Problem Relation Age of Onset  . Hypertension Mother   . Hyperlipidemia Mother   . Diabetes Mother   . Hypertension Father     History   Social History  . Marital Status: Married    Spouse Name: N/A    Number of Children: N/A  . Years of Education: N/A   Occupational History  . 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 [place]    Social History Main Topics  . Smoking status: Never Smoker   . Smokeless tobacco: Not on file  . Alcohol Use: 1.0 oz/week    2 drink(s) per  week  . Drug Use: No  . Sexual Activity: Active    ROS Constitutional: Denies fever, chills, weight loss/gain, headaches, insomnia, fatigue, night sweats or change in appetite. Eyes: Denies redness, blurred vision, diplopia, discharge, itchy or watery eyes.  ENT: Denies discharge, congestion, post nasal drip, epistaxis, sore throat, earache, hearing loss, dental pain, Tinnitus, Vertigo, Sinus pain or snoring.  Cardio: Denies chest pain, palpitations, irregular heartbeat, syncope, dyspnea, diaphoresis, orthopnea, PND, claudication or edema Respiratory: denies cough, dyspnea, DOE, pleurisy, hoarseness, laryngitis or wheezing.  Gastrointestinal: Denies dysphagia, heartburn, reflux,  water brash, pain, cramps, nausea, vomiting, bloating, diarrhea, constipation, hematemesis, melena, hematochezia, jaundice or hemorrhoids Genitourinary: Denies dysuria, frequency, urgency, nocturia, hesitancy, discharge, hematuria or flank pain Musculoskeletal: Denies arthralgia, myalgia, stiffness, Jt. Swelling, pain, limp or strain/sprain. Denies Falls. Skin: Denies puritis, rash, hives, warts, acne, eczema or change in skin lesion Neuro: No weakness, tremor, incoordination, spasms, paresthesia or pain Psychiatric: Denies confusion, memory loss or sensory loss. Denies Depression. Endocrine: Denies change in weight, skin, hair change, nocturia, and paresthesia, diabetic polys, visual blurring or hyper / hypo glycemic episodes.  Heme/Lymph: No excessive bleeding, bruising or enlarged lymph nodes.  Physical Exam  BP 108/80  Pulse 60  Temp 98.1 F   Resp 16  Ht 5' 8.75" Wt 197 lb 12.8 oz   BMI 29.43  General Appearance: Well nourished, in no apparent distress. Eyes: PERRLA, EOMs, conjunctiva no swelling or erythema, normal fundi and vessels. Sinuses: No frontal/maxillary tenderness ENT/Mouth: EACs patent / TMs  nl. Nares clear without erythema, swelling, mucoid exudates. Oral hygiene is good. No erythema, swelling, or exudate. Tongue normal, non-obstructing. Tonsils not swollen or erythematous. Hearing normal.  Neck: Supple, thyroid normal. No bruits, nodes or JVD. Respiratory: Respiratory effort normal.  BS equal and clear bilateral without rales, rhonci, wheezing or stridor. Cardio: Heart sounds are normal with regular rate and rhythm and no murmurs, rubs or gallops. Peripheral pulses are normal and equal bilaterally without edema. No aortic or femoral bruits. Chest: symmetric with normal excursions and percussion.  Abdomen: Flat, soft, with bowl sounds. Nontender, no guarding, rebound, hernias, masses, or organomegaly.  Lymphatics: Non tender without lymphadenopathy.  Genitourinary: No  hernias.Testes nl. DRE - prostate nl for age - smooth & firm w/o nodules. Musculoskeletal: Full ROM all peripheral extremities, joint stability, 5/5 strength, and normal gait. Skin: Warm and dry without rashes, lesions, cyanosis, clubbing or  ecchymosis.  Neuro: Cranial nerves intact, reflexes equal bilaterally. Normal muscle tone, no cerebellar symptoms. Sensation intact.  Pysch: Awake and oriented X 3with normal affect, insight and judgment appropriate.   Assessment and Plan  1. Annual Screening Examination 2. Hypertension  3. Hyperlipidemia 4. Pre Diabetes 5. Vitamin D Deficiency 6. Testosterone Deficiency   Continue prudent diet as discussed, weight control, BP monitoring, regular exercise, and medications as discussed.  Discussed med effects and SE's. Routine screening labs and tests as requested with regular follow-up as recommended.

## 2014-01-08 ENCOUNTER — Encounter: Payer: Self-pay | Admitting: Internal Medicine

## 2014-01-08 ENCOUNTER — Ambulatory Visit (INDEPENDENT_AMBULATORY_CARE_PROVIDER_SITE_OTHER): Payer: 59 | Admitting: Internal Medicine

## 2014-01-08 VITALS — BP 108/80 | HR 60 | Temp 98.1°F | Resp 16 | Ht 68.75 in | Wt 197.8 lb

## 2014-01-08 DIAGNOSIS — R7303 Prediabetes: Secondary | ICD-10-CM

## 2014-01-08 DIAGNOSIS — I10 Essential (primary) hypertension: Secondary | ICD-10-CM

## 2014-01-08 DIAGNOSIS — E559 Vitamin D deficiency, unspecified: Secondary | ICD-10-CM

## 2014-01-08 DIAGNOSIS — E785 Hyperlipidemia, unspecified: Secondary | ICD-10-CM

## 2014-01-08 DIAGNOSIS — R6889 Other general symptoms and signs: Secondary | ICD-10-CM

## 2014-01-08 DIAGNOSIS — Z1212 Encounter for screening for malignant neoplasm of rectum: Secondary | ICD-10-CM

## 2014-01-08 DIAGNOSIS — Z113 Encounter for screening for infections with a predominantly sexual mode of transmission: Secondary | ICD-10-CM

## 2014-01-08 DIAGNOSIS — R7309 Other abnormal glucose: Secondary | ICD-10-CM

## 2014-01-08 DIAGNOSIS — Z23 Encounter for immunization: Secondary | ICD-10-CM

## 2014-01-08 DIAGNOSIS — Z125 Encounter for screening for malignant neoplasm of prostate: Secondary | ICD-10-CM

## 2014-01-08 DIAGNOSIS — R748 Abnormal levels of other serum enzymes: Secondary | ICD-10-CM

## 2014-01-08 DIAGNOSIS — Z79899 Other long term (current) drug therapy: Secondary | ICD-10-CM

## 2014-01-08 DIAGNOSIS — E291 Testicular hypofunction: Secondary | ICD-10-CM

## 2014-01-08 DIAGNOSIS — Z111 Encounter for screening for respiratory tuberculosis: Secondary | ICD-10-CM

## 2014-01-08 DIAGNOSIS — Z0001 Encounter for general adult medical examination with abnormal findings: Secondary | ICD-10-CM

## 2014-01-08 LAB — CBC WITH DIFFERENTIAL/PLATELET
Basophils Absolute: 0 10*3/uL (ref 0.0–0.1)
Basophils Relative: 1 % (ref 0–1)
Eosinophils Absolute: 0.2 10*3/uL (ref 0.0–0.7)
Eosinophils Relative: 5 % (ref 0–5)
HCT: 41.5 % (ref 39.0–52.0)
Hemoglobin: 14 g/dL (ref 13.0–17.0)
Lymphocytes Relative: 23 % (ref 12–46)
Lymphs Abs: 1 10*3/uL (ref 0.7–4.0)
MCH: 29 pg (ref 26.0–34.0)
MCHC: 33.7 g/dL (ref 30.0–36.0)
MCV: 85.9 fL (ref 78.0–100.0)
Monocytes Absolute: 0.4 10*3/uL (ref 0.1–1.0)
Monocytes Relative: 10 % (ref 3–12)
Neutro Abs: 2.6 10*3/uL (ref 1.7–7.7)
Neutrophils Relative %: 61 % (ref 43–77)
Platelets: 253 10*3/uL (ref 150–400)
RBC: 4.83 MIL/uL (ref 4.22–5.81)
RDW: 14.6 % (ref 11.5–15.5)
WBC: 4.3 10*3/uL (ref 4.0–10.5)

## 2014-01-08 LAB — HEMOGLOBIN A1C
Hgb A1c MFr Bld: 5.8 % — ABNORMAL HIGH (ref <5.7)
Mean Plasma Glucose: 120 mg/dL — ABNORMAL HIGH (ref <117)

## 2014-01-08 MED ORDER — ASPIRIN EC 81 MG PO TBEC: 81.0000 mg | DELAYED_RELEASE_TABLET | Freq: Every day | ORAL | Status: AC

## 2014-01-08 NOTE — Patient Instructions (Signed)
Recommend the book "The END of DIETING" by Dr Baker Janus   and the book "The END of DIABETES " by Dr Excell Seltzer  At [email or web address] - get book & Audio CD's      Being diabetic has a  300% increased risk for heart attack, stroke, cancer, and alzheimer- type vascular dementia. It is very important that you work harder with diet by avoiding all foods that are white except chicken & fish. Avoid white rice (brown & wild rice is OK), white potatoes (sweetpotatoes in moderation is OK), White bread or wheat bread or anything made out of white flour like bagels, donuts, rolls, buns, biscuits, cakes, pastries, cookies, pizza crust, and pasta (made from white flour & egg whites) - vegetarian pasta or spinach or wheat pasta is OK. Multigrain breads like Arnold's or [employer], or multigrain sandwich thins or flatbreads.  Diet, exercise and weight loss can reverse and cure diabetes in the early stages.  Diet, exercise and weight loss is very important in the control and prevention of complications of diabetes which affects every system in your body, ie. Brain - dementia/stroke, eyes - glaucoma/blindness, heart - heart attack/heart failure, kidneys - dialysis, stomach - gastric paralysis, intestines - malabsorption, nerves - severe painful neuritis, circulation - gangrene & loss of a leg(s), and finally cancer and Alzheimers.    I recommend avoid fried & greasy foods,  sweets/candy, white rice (brown or wild rice or Quinoa is OK), white potatoes (sweet potatoes are OK) - anything made from white flour - bagels, doughnuts, rolls, buns, biscuits,white and wheat breads, pizza crust and traditional pasta made of white flour & egg white(vegetarian pasta or spinach or wheat pasta is OK).  Multi-grain bread is OK - like multi-grain flat bread or sandwich thins. Avoid alcohol in excess. Exercise is also important.    Eat all the vegetables you want - avoid meat, especially red meat and dairy - especially cheese.  Cheese  is the most concentrated form of trans-fats which is the worst thing to clog up our arteries. Veggie cheese is OK which can be found in the fresh produce section at [employer] or [employer] or [employer]  Preventive Care for Adults A healthy lifestyle and preventive care can promote health and wellness. Preventive health guidelines for men include the following key practices:  A routine yearly physical is a good way to check with your health care provider about your health and preventative screening. It is a chance to share any concerns and updates on your health and to receive a thorough exam.  Visit your dentist for a routine exam and preventative care every 6 months. Brush your teeth twice a day and floss once a day. Good oral hygiene prevents tooth decay and gum disease.  The frequency of eye exams is based on your age, health, family medical history, use of contact lenses, and other factors. Follow your health care provider's recommendations for frequency of eye exams.  Eat a healthy diet. Foods such as vegetables, fruits, whole grains, low-fat dairy products, and lean protein foods contain the nutrients you need without too many calories. Decrease your intake of foods high in solid fats, added sugars, and salt. Eat the right amount of calories for you.Get information about a proper diet from your health care provider, if necessary.  Regular physical exercise is one of the most important things you can do for your health. Most adults should get at least 150 minutes of moderate-intensity exercise (any activity that  increases your heart rate and causes you to sweat) each week. In addition, most adults need muscle-strengthening exercises on 2 or more days a week.  Maintain a healthy weight. The body mass index (BMI) is a screening tool to identify possible weight problems. It provides an estimate of body fat based on height and weight. Your health care provider can find your BMI and can help you  achieve or maintain a healthy weight.For adults 20 years and older:  A BMI below 18.5 is considered underweight.  A BMI of 18.5 to 24.9 is normal.  A BMI of 25 to 29.9 is considered overweight.  A BMI of 30 and above is considered obese.  Maintain normal blood lipids and cholesterol levels by exercising and minimizing your intake of saturated fat. Eat a balanced diet with plenty of fruit and vegetables. Blood tests for lipids and cholesterol should begin at age 20 and be repeated every 5 years. If your lipid or cholesterol levels are high, you are over 50, or you are at high risk for heart disease, you may need your cholesterol levels checked more frequently.Ongoing high lipid and cholesterol levels should be treated with medicines if diet and exercise are not working.  If you smoke, find out from your health care provider how to quit. If you do not use tobacco, do not start.  Lung cancer screening is recommended for adults aged 72-80 years who are at high risk for developing lung cancer because of a history of smoking. A yearly low-dose CT scan of the lungs is recommended for people who have at least a 30-pack-year history of smoking and are a current smoker or have quit within the past 15 years. A pack year of smoking is smoking an average of 1 pack of cigarettes a day for 1 year (for example: 1 pack a day for 30 years or 2 packs a day for 15 years). Yearly screening should continue until the smoker has stopped smoking for at least 15 years. Yearly screening should be stopped for people who develop a health problem that would prevent them from having lung cancer treatment.  If you choose to drink alcohol, do not have more than 2 drinks per day. One drink is considered to be 12 ounces (355 mL) of beer, 5 ounces (148 mL) of wine, or 1.5 ounces (44 mL) of liquor.  Avoid use of street drugs. Do not share needles with anyone. Ask for help if you need support or instructions about stopping the use of  drugs.  High blood pressure causes heart disease and increases the risk of stroke. Your blood pressure should be checked at least every 1-2 years. Ongoing high blood pressure should be treated with medicines, if weight loss and exercise are not effective.  If you are 28-64 years old, ask your health care provider if you should take aspirin to prevent heart disease.  Diabetes screening involves taking a blood sample to check your fasting blood sugar level. This should be done once every 3 years, after age 13, if you are within normal weight and without risk factors for diabetes. Testing should be considered at a younger age or be carried out more frequently if you are overweight and have at least 1 risk factor for diabetes.  Colorectal cancer can be detected and often prevented. Most routine colorectal cancer screening begins at the age of 78 and continues through age 56. However, your health care provider may recommend screening at an earlier age if you have risk  factors for colon cancer. On a yearly basis, your health care provider may provide home test kits to check for hidden blood in the stool. Use of a small camera at the end of a tube to directly examine the colon (sigmoidoscopy or colonoscopy) can detect the earliest forms of colorectal cancer. Talk to your health care provider about this at age 48, when routine screening begins. Direct exam of the colon should be repeated every 5-10 years through age 60, unless early forms of precancerous polyps or small growths are found.  People who are at an increased risk for hepatitis B should be screened for this virus. You are considered at high risk for hepatitis B if:  You were born in a country where hepatitis B occurs often. Talk with your health care provider about which countries are considered high risk.  Your parents were born in a high-risk country and you have not received a shot to protect against hepatitis B (hepatitis B vaccine).  You have  HIV or AIDS.  You use needles to inject street drugs.  You live with, or have sex with, someone who has hepatitis B.  You are a man who has sex with other men (MSM).  You get hemodialysis treatment.  You take certain medicines for conditions such as cancer, organ transplantation, and autoimmune conditions.  Hepatitis C blood testing is recommended for all people born from [birth year] through 1965 and any individual with known risks for hepatitis C.  Practice safe sex. Use condoms and avoid high-risk sexual practices to reduce the spread of sexually transmitted infections (STIs). STIs include gonorrhea, chlamydia, syphilis, trichomonas, herpes, HPV, and human immunodeficiency virus (HIV). Herpes, HIV, and HPV are viral illnesses that have no cure. They can result in disability, cancer, and death.  If you are at risk of being infected with HIV, it is recommended that you take a prescription medicine daily to prevent HIV infection. This is called preexposure prophylaxis (PrEP). You are considered at risk if:  You are a man who has sex with other men (MSM) and have other risk factors.  You are a heterosexual man, are sexually active, and are at increased risk for HIV infection.  You take drugs by injection.  You are sexually active with a partner who has HIV.  Talk with your health care provider about whether you are at high risk of being infected with HIV. If you choose to begin PrEP, you should first be tested for HIV. You should then be tested every 3 months for as long as you are taking PrEP.  A one-time screening for abdominal aortic aneurysm (AAA) and surgical repair of large AAAs by ultrasound are recommended for men ages 51 to 11 years who are current or former smokers.  Healthy men should no longer receive prostate-specific antigen (PSA) blood tests as part of routine cancer screening. Talk with your health care provider about prostate cancer screening.  Testicular cancer screening is  not recommended for adult males who have no symptoms. Screening includes self-exam, a health care provider exam, and other screening tests. Consult with your health care provider about any symptoms you have or any concerns you have about testicular cancer.  Use sunscreen. Apply sunscreen liberally and repeatedly throughout the day. You should seek shade when your shadow is shorter than you. Protect yourself by wearing long sleeves, pants, a wide-brimmed hat, and sunglasses year round, whenever you are outdoors.  Once a month, do a whole-body skin exam, using a mirror to look  at the skin on your back. Tell your health care provider about new moles, moles that have irregular borders, moles that are larger than a pencil eraser, or moles that have changed in shape or color.  Stay current with required vaccines (immunizations).  Influenza vaccine. All adults should be immunized every year.  Tetanus, diphtheria, and acellular pertussis (Td, Tdap) vaccine. An adult who has not previously received Tdap or who does not know his vaccine status should receive 1 dose of Tdap. This initial dose should be followed by tetanus and diphtheria toxoids (Td) booster doses every 10 years. Adults with an unknown or incomplete history of completing a 3-dose immunization series with Td-containing vaccines should begin or complete a primary immunization series including a Tdap dose. Adults should receive a Td booster every 10 years.  Varicella vaccine. An adult without evidence of immunity to varicella should receive 2 doses or a second dose if he has previously received 1 dose.  Human papillomavirus (HPV) vaccine. Males aged 29-21 years who have not received the vaccine previously should receive the 3-dose series. Males aged 22-26 years may be immunized. Immunization is recommended through the age of 26 years for any male who has sex with males and did not get any or all doses earlier. Immunization is recommended for any  person with an immunocompromised condition through the age of 29 years if he did not get any or all doses earlier. During the 3-dose series, the second dose should be obtained 4-8 weeks after the first dose. The third dose should be obtained 24 weeks after the first dose and 16 weeks after the second dose.  Zoster vaccine. One dose is recommended for adults aged 38 years or older unless certain conditions are present.  Measles, mumps, and rubella (MMR) vaccine. Adults born before [birth year] generally are considered immune to measles and mumps. Adults born in [birth year] or later should have 1 or more doses of MMR vaccine unless there is a contraindication to the vaccine or there is laboratory evidence of immunity to each of the three diseases. A routine second dose of MMR vaccine should be obtained at least 28 days after the first dose for students attending postsecondary schools, health care workers, or international travelers. People who received inactivated measles vaccine or an unknown type of measles vaccine during 1963-1967 should receive 2 doses of MMR vaccine. People who received inactivated mumps vaccine or an unknown type of mumps vaccine before 1979 and are at high risk for mumps infection should consider immunization with 2 doses of MMR vaccine. Unvaccinated health care workers born before [birth year] who lack laboratory evidence of measles, mumps, or rubella immunity or laboratory confirmation of disease should consider measles and mumps immunization with 2 doses of MMR vaccine or rubella immunization with 1 dose of MMR vaccine.  Pneumococcal 13-valent conjugate (PCV13) vaccine. When indicated, a person who is uncertain of his immunization history and has no record of immunization should receive the PCV13 vaccine. An adult aged 68 years or older who has certain medical conditions and has not been previously immunized should receive 1 dose of PCV13 vaccine. This PCV13 should be followed with a dose of pneumococcal  polysaccharide (PPSV23) vaccine. The PPSV23 vaccine dose should be obtained at least 8 weeks after the dose of PCV13 vaccine. An adult aged 95 years or older who has certain medical conditions and previously received 1 or more doses of PPSV23 vaccine should receive 1 dose of PCV13. The PCV13 vaccine dose should be obtained 1  or more years after the last PPSV23 vaccine dose.  Pneumococcal polysaccharide (PPSV23) vaccine. When PCV13 is also indicated, PCV13 should be obtained first. All adults aged 10 years and older should be immunized. An adult younger than age 43 years who has certain medical conditions should be immunized. Any person who resides in a nursing home or long-term care facility should be immunized. An adult smoker should be immunized. People with an immunocompromised condition and certain other conditions should receive both PCV13 and PPSV23 vaccines. People with human immunodeficiency virus (HIV) infection should be immunized as soon as possible after diagnosis. Immunization during chemotherapy or radiation therapy should be avoided. Routine use of PPSV23 vaccine is not recommended for American Indians, [place]atives, or people younger than 65 years unless there are medical conditions that require PPSV23 vaccine. When indicated, people who have unknown immunization and have no record of immunization should receive PPSV23 vaccine. One-time revaccination 5 years after the first dose of PPSV23 is recommended for people aged 19-64 years who have chronic kidney failure, nephrotic syndrome, asplenia, or immunocompromised conditions. People who received 1-2 doses of PPSV23 before age 43 years should receive another dose of PPSV23 vaccine at age 43 years or later if at least 5 years have passed since the previous dose. Doses of PPSV23 are not needed for people immunized with PPSV23 at or after age 63 years.  Meningococcal vaccine. Adults with asplenia or persistent complement component deficiencies  should receive 2 doses of quadrivalent meningococcal conjugate (MenACWY-D) vaccine. The doses should be obtained at least 2 months apart. Microbiologists working with certain meningococcal bacteria, [place] recruits, people at risk during an outbreak, and people who travel to or live in countries with a high rate of meningitis should be immunized. A first-year college student up through age 70 years who is living in a residence hall should receive a dose if he did not receive a dose on or after his 16th birthday. Adults who have certain high-risk conditions should receive one or more doses of vaccine.  Hepatitis A vaccine. Adults who wish to be protected from this disease, have certain high-risk conditions, work with hepatitis A-infected animals, work in hepatitis A research labs, or travel to or work in countries with a high rate of hepatitis A should be immunized. Adults who were previously unvaccinated and who anticipate close contact with an international adoptee during the first 60 days after arrival in the [place] from a country with a high rate of hepatitis A should be immunized.  Hepatitis B vaccine. Adults should be immunized if they wish to be protected from this disease, have certain high-risk conditions, may be exposed to blood or other infectious body fluids, are household contacts or sex partners of hepatitis B positive people, are clients or workers in certain care facilities, or travel to or work in countries with a high rate of hepatitis B.  Haemophilus influenzae type b (Hib) vaccine. A previously unvaccinated person with asplenia or sickle cell disease or having a scheduled splenectomy should receive 1 dose of Hib vaccine. Regardless of previous immunization, a recipient of a hematopoietic stem cell transplant should receive a 3-dose series 6-12 months after his successful transplant. Hib vaccine is not recommended for adults with HIV infection. Preventive Service / Frequency Ages  76 to 70  Blood pressure check.** / Every 1 to 2 years.  Lipid and cholesterol check.** / Every 5 years beginning at age 32.  Lung cancer screening. / Every year if you are aged 85-80  years and have a 30-pack-year history of smoking and currently smoke or have quit within the past 15 years. Yearly screening is stopped once you have quit smoking for at least 15 years or develop a health problem that would prevent you from having lung cancer treatment.  Fecal occult blood test (FOBT) of stool. / Every year beginning at age 39 and continuing until age 29. You may not have to do this test if you get a colonoscopy every 10 years.  Flexible sigmoidoscopy** or colonoscopy.** / Every 5 years for a flexible sigmoidoscopy or every 10 years for a colonoscopy beginning at age 24 and continuing until age 23.  Hepatitis C blood test.** / For all people born from [birth year] through 1965 and any individual with known risks for hepatitis C.  Skin self-exam. / Monthly.  Influenza vaccine. / Every year.  Tetanus, diphtheria, and acellular pertussis (Tdap/Td) vaccine.** / Consult your health care provider. 1 dose of Td every 10 years.  Varicella vaccine.** / Consult your health care provider.  Zoster vaccine.** / 1 dose for adults aged 29 years or older.  Measles, mumps, rubella (MMR) vaccine.** / You need at least 1 dose of MMR if you were born in 1957 or later. You may also need a second dose.  Pneumococcal 13-valent conjugate (PCV13) vaccine.** / Consult your health care provider.  Pneumococcal polysaccharide (PPSV23) vaccine.** / 1 to 2 doses if you smoke cigarettes or if you have certain conditions.  Meningococcal vaccine.** / Consult your health care provider.  Hepatitis A vaccine.** / Consult your health care provider.  Hepatitis B vaccine.** / Consult your health care provider.  Haemophilus influenzae type b (Hib) vaccine.** / Consult your health care provider.

## 2014-01-09 LAB — URINALYSIS, MICROSCOPIC ONLY
Bacteria, UA: NONE SEEN
Casts: NONE SEEN
Squamous Epithelial / LPF: NONE SEEN

## 2014-01-09 LAB — HEPATIC FUNCTION PANEL
ALT: 25 U/L (ref 0–53)
AST: 37 U/L (ref 0–37)
Albumin: 4.4 g/dL (ref 3.5–5.2)
Alkaline Phosphatase: 73 U/L (ref 39–117)
Bilirubin, Direct: 0.1 mg/dL (ref 0.0–0.3)
Indirect Bilirubin: 0.2 mg/dL (ref 0.2–1.2)
Total Bilirubin: 0.3 mg/dL (ref 0.2–1.2)
Total Protein: 6.8 g/dL (ref 6.0–8.3)

## 2014-01-09 LAB — VITAMIN D 25 HYDROXY (VIT D DEFICIENCY, FRACTURES): Vit D, 25-Hydroxy: 80 ng/mL (ref 30–89)

## 2014-01-09 LAB — BASIC METABOLIC PANEL WITH GFR
BUN: 18 mg/dL (ref 6–23)
CO2: 29 mEq/L (ref 19–32)
Calcium: 9.6 mg/dL (ref 8.4–10.5)
Chloride: 106 mEq/L (ref 96–112)
Creat: 1.04 mg/dL (ref 0.50–1.35)
GFR, Est African American: >89 mL/min
GFR, Est Non African American: 79 mL/min
Glucose, Bld: 80 mg/dL (ref 70–99)
Potassium: 4.4 mEq/L (ref 3.5–5.3)
Sodium: 142 mEq/L (ref 135–145)

## 2014-01-09 LAB — MICROALBUMIN / CREATININE URINE RATIO
Creatinine, Urine: 190.8 mg/dL
Microalb Creat Ratio: 16.8 mg/g (ref 0.0–30.0)
Microalb, Ur: 3.2 mg/dL — ABNORMAL HIGH (ref <2.0)

## 2014-01-09 LAB — TESTOSTERONE: Testosterone: 265 ng/dL — ABNORMAL LOW (ref 300–890)

## 2014-01-09 LAB — LIPID PANEL
Cholesterol: 138 mg/dL (ref 0–200)
HDL: 52 mg/dL (ref >39)
LDL Cholesterol: 61 mg/dL (ref 0–99)
Total CHOL/HDL Ratio: 2.7 Ratio
Triglycerides: 127 mg/dL (ref <150)
VLDL: 25 mg/dL (ref 0–40)

## 2014-01-09 LAB — INSULIN, FASTING: Insulin fasting, serum: 16.5 u[IU]/mL (ref 2.0–19.6)

## 2014-01-09 LAB — MAGNESIUM: Magnesium: 2.2 mg/dL (ref 1.5–2.5)

## 2014-01-09 LAB — VITAMIN B12: Vitamin B-12: 826 pg/mL (ref 211–911)

## 2014-01-09 LAB — PSA: PSA: 3.53 ng/mL (ref <=4.00)

## 2014-01-09 LAB — TSH: TSH: 1.332 u[IU]/mL (ref 0.350–4.500)

## 2014-01-11 LAB — TB SKIN TEST
Induration: 0 mm
TB Skin Test: NEGATIVE

## 2014-01-18 ENCOUNTER — Other Ambulatory Visit (INDEPENDENT_AMBULATORY_CARE_PROVIDER_SITE_OTHER): Payer: 59 | Admitting: *Deleted

## 2014-01-18 DIAGNOSIS — Z1212 Encounter for screening for malignant neoplasm of rectum: Secondary | ICD-10-CM

## 2014-01-18 LAB — POC HEMOCCULT BLD/STL (HOME/3-CARD/SCREEN)
Card #2 Fecal Occult Blod, POC: NEGATIVE
Card #3 Fecal Occult Blood, POC: NEGATIVE
Fecal Occult Blood, POC: NEGATIVE

## 2014-03-02 ENCOUNTER — Other Ambulatory Visit: Payer: Self-pay | Admitting: Physician Assistant

## 2014-03-15 ENCOUNTER — Other Ambulatory Visit: Payer: Self-pay | Admitting: *Deleted

## 2014-03-15 MED ORDER — ROSUVASTATIN CALCIUM 40 MG PO TABS: 40.0000 mg | ORAL_TABLET | Freq: Every day | ORAL | Status: DC

## 2014-04-07 ENCOUNTER — Other Ambulatory Visit: Payer: Self-pay | Admitting: Internal Medicine

## 2014-04-25 ENCOUNTER — Ambulatory Visit (INDEPENDENT_AMBULATORY_CARE_PROVIDER_SITE_OTHER): Payer: 59 | Admitting: Physician Assistant

## 2014-04-25 VITALS — BP 102/64 | HR 72 | Temp 97.9°F | Resp 16 | Ht 68.75 in | Wt 197.0 lb

## 2014-04-25 DIAGNOSIS — E785 Hyperlipidemia, unspecified: Secondary | ICD-10-CM

## 2014-04-25 DIAGNOSIS — R7303 Prediabetes: Secondary | ICD-10-CM

## 2014-04-25 DIAGNOSIS — I10 Essential (primary) hypertension: Secondary | ICD-10-CM

## 2014-04-25 DIAGNOSIS — E559 Vitamin D deficiency, unspecified: Secondary | ICD-10-CM

## 2014-04-25 DIAGNOSIS — R7309 Other abnormal glucose: Secondary | ICD-10-CM

## 2014-04-25 DIAGNOSIS — G43009 Migraine without aura, not intractable, without status migrainosus: Secondary | ICD-10-CM

## 2014-04-25 DIAGNOSIS — Z79899 Other long term (current) drug therapy: Secondary | ICD-10-CM

## 2014-04-25 DIAGNOSIS — E291 Testicular hypofunction: Secondary | ICD-10-CM

## 2014-04-25 DIAGNOSIS — G43909 Migraine, unspecified, not intractable, without status migrainosus: Secondary | ICD-10-CM | POA: Insufficient documentation

## 2014-04-25 DIAGNOSIS — J209 Acute bronchitis, unspecified: Secondary | ICD-10-CM

## 2014-04-25 LAB — CBC WITH DIFFERENTIAL/PLATELET
Basophils Absolute: 0 10*3/uL (ref 0.0–0.1)
Basophils Relative: 0 % (ref 0–1)
Eosinophils Absolute: 0.3 10*3/uL (ref 0.0–0.7)
Eosinophils Relative: 4 % (ref 0–5)
HCT: 44.4 % (ref 39.0–52.0)
Hemoglobin: 14.5 g/dL (ref 13.0–17.0)
Lymphocytes Relative: 21 % (ref 12–46)
Lymphs Abs: 1.4 10*3/uL (ref 0.7–4.0)
MCH: 28 pg (ref 26.0–34.0)
MCHC: 32.7 g/dL (ref 30.0–36.0)
MCV: 85.9 fL (ref 78.0–100.0)
MPV: 9.1 fL (ref 8.6–12.4)
Monocytes Absolute: 0.7 10*3/uL (ref 0.1–1.0)
Monocytes Relative: 10 % (ref 3–12)
Neutro Abs: 4.4 10*3/uL (ref 1.7–7.7)
Neutrophils Relative %: 65 % (ref 43–77)
Platelets: 279 10*3/uL (ref 150–400)
RBC: 5.17 MIL/uL (ref 4.22–5.81)
RDW: 13.9 % (ref 11.5–15.5)
WBC: 6.8 10*3/uL (ref 4.0–10.5)

## 2014-04-25 LAB — HEPATIC FUNCTION PANEL
ALT: 23 U/L (ref 0–53)
AST: 26 U/L (ref 0–37)
Albumin: 4.4 g/dL (ref 3.5–5.2)
Alkaline Phosphatase: 77 U/L (ref 39–117)
Bilirubin, Direct: 0.1 mg/dL (ref 0.0–0.3)
Indirect Bilirubin: 0.3 mg/dL (ref 0.2–1.2)
Total Bilirubin: 0.4 mg/dL (ref 0.2–1.2)
Total Protein: 6.8 g/dL (ref 6.0–8.3)

## 2014-04-25 LAB — BASIC METABOLIC PANEL WITH GFR
BUN: 16 mg/dL (ref 6–23)
CO2: 30 mEq/L (ref 19–32)
Calcium: 9.1 mg/dL (ref 8.4–10.5)
Chloride: 103 mEq/L (ref 96–112)
Creat: 1.03 mg/dL (ref 0.50–1.35)
GFR, Est African American: >89 mL/min
GFR, Est Non African American: 80 mL/min
Glucose, Bld: 75 mg/dL (ref 70–99)
Potassium: 4.5 mEq/L (ref 3.5–5.3)
Sodium: 140 mEq/L (ref 135–145)

## 2014-04-25 LAB — LIPID PANEL
Cholesterol: 129 mg/dL (ref 0–200)
HDL: 38 mg/dL — ABNORMAL LOW (ref >39)
LDL Cholesterol: 71 mg/dL (ref 0–99)
Total CHOL/HDL Ratio: 3.4 Ratio
Triglycerides: 98 mg/dL (ref <150)
VLDL: 20 mg/dL (ref 0–40)

## 2014-04-25 LAB — MAGNESIUM: Magnesium: 2.3 mg/dL (ref 1.5–2.5)

## 2014-04-25 LAB — TSH: TSH: 0.894 u[IU]/mL (ref 0.350–4.500)

## 2014-04-25 MED ORDER — PROMETHAZINE-CODEINE 6.25-10 MG/5ML PO SYRP: 5.0000 mL | ORAL_SOLUTION | Freq: Four times a day (QID) | ORAL | Status: DC | PRN

## 2014-04-25 NOTE — Progress Notes (Signed)
Assessment and Plan:  Hypertension: Continue medication, monitor blood pressure at home. Continue DASH diet.  Reminder to go to the ER if any CP, SOB, nausea, dizziness, severe HA, changes vision/speech, left arm numbness and tingling, and jaw pain. Cholesterol: Continue diet and exercise. Check cholesterol.  Pre-diabetes-Continue diet and exercise. Check A1C Vitamin D Def- check level and continue medications. Hypogonadism- continue replacement therapy, check testosterone levels as needed.   URI- Discussed diagnosis and treatment of URI. Discussed the importance of avoiding unnecessary antibiotic therapy. Suggested symptomatic OTC remedies. Nasal saline spray for congestion. Follow up as needed. Call in 7 days if symptoms aren't resolving.   Continue diet and meds as discussed. Further disposition pending results of labs.  HPI 59 y.o. male  presents for 3 month follow up with hypertension, hyperlipidemia, prediabetes and vitamin D. His blood pressure has been controlled at home, today their BP is BP: 102/64 mmHg He does workout. He denies chest pain, shortness of breath, dizziness.  He is on crestor 40 1/2 pill QD and denies myalgias. His cholesterol is at goal. The cholesterol last visit was:   Lab Results  Component Value Date   CHOL 138 01/08/2014   HDL 52 01/08/2014   LDLCALC 61 01/08/2014   TRIG 127 01/08/2014   CHOLHDL 2.7 01/08/2014  He has been working on diet and exercise for prediabetes, and denies paresthesia of the feet, polydipsia, polyuria and visual disturbances. Last A1C in the office was:  Lab Results  Component Value Date   HGBA1C 5.8* 01/08/2014  Patient is on Vitamin D supplement.   Lab Results  Component Value Date   VD25OH 69 01/08/2014  He has a history of testosterone deficiency and is on testosterone replacement. He states that the testosterone helps with his energy, libido, muscle mass. Lab Results  Component Value Date   TESTOSTERONE 265* 01/08/2014   Patient also complains of a cough. Onset of symptoms was 4 days ago. Symptoms have been gradually improving since that time. The cough is productive and is aggravated by reclining position. Associated symptoms include: change in voice, postnasal drip and sputum production. Patient does not have a history of asthma. Patient does have a history of environmental allergens. Patient has not traveled recently. Patient does not have a history of smoking. He has been certizine and OTC cough medicine that has helped some but his wife is requesting something to help with cough at night.   Current Medications:  Current Outpatient Prescriptions on File Prior to Visit  Medication Sig Dispense Refill  . ALPRAZolam (XANAX) 0.5 MG tablet     . aspirin EC 81 MG tablet Take 1 tablet (81 mg total) by mouth daily. 150 tablet 2  . bifidobacterium infantis (ALIGN) capsule TAKE ONE PILL BY MOUTH ONCE DAILY 30 capsule 4  . Cholecalciferol (VITAMIN D-3) 5000 UNITS TABS Take 5,000 Units by mouth daily.    Marland Kitchen CIALIS 5 MG tablet TAKE 1 TABLET DAILY FOR    PROSTATISM 90 tablet 99  . clobetasol cream (TEMOVATE) 0.05 %     . Flaxseed, Linseed, (FLAX SEED OIL PO) Take 1,000 mg by mouth 3 (three) times daily.     . magnesium 30 MG tablet Take 30 mg by mouth 2 (two) times daily.    . meloxicam (MOBIC) 15 MG tablet Take 15 mg by mouth as needed for pain.    . moexipril (UNIVASC) 15 MG tablet TAKE 1 TABLET DAILY FOR    BLOOD PRESSURE 90 tablet 3  .  Omega-3 Fatty Acids (FISH OIL PO) Take 1,000 mg by mouth 3 (three) times daily.     . pregabalin (LYRICA) 200 MG capsule Take 200 mg by mouth 2 (two) times daily.    . rosuvastatin (CRESTOR) 40 MG tablet Take 1 tablet (40 mg total) by mouth daily. 90 tablet 3  . TAZORAC 0.05 % cream     . Testosterone (FORTESTA TD) Place onto the skin daily.    . vitamin B-12 (CYANOCOBALAMIN) 100 MCG tablet Take 100 mcg by mouth daily.    Marland Kitchen ZOLMitriptan (ZOMIG) 2.5 MG tablet Take 2.5 mg by mouth once.  Pt not sure of mg     No current facility-administered medications on file prior to visit.   Medical History:  Past Medical History  Diagnosis Date  . Hypertension   . Hyperlipidemia   . Vitamin D deficiency   . Pre-diabetes   . Hypogonadism, male    Allergies: No Known Allergies   Review of Systems:  Review of Systems  Constitutional: Negative.  Negative for fever and chills.  HENT: Positive for congestion. Negative for ear discharge, ear pain, hearing loss, nosebleeds, sore throat and tinnitus.   Eyes: Negative.   Respiratory: Positive for cough. Negative for hemoptysis, sputum production, shortness of breath, wheezing and stridor.   Cardiovascular: Negative.  Negative for chest pain.  Gastrointestinal: Negative.   Genitourinary: Negative.   Musculoskeletal: Negative.   Skin: Negative.   Neurological: Negative.  Negative for headaches.  Endo/Heme/Allergies: Negative.   Psychiatric/Behavioral: Negative.      Family history- Review and unchanged Social history- Review and unchanged Physical Exam: BP 102/64 mmHg  Pulse 72  Temp(Src) 97.9 F (36.6 C)  Resp 16  Ht 5' 8.75" (1.746 m)  Wt 197 lb (89.359 kg)  BMI 29.31 kg/m2 Wt Readings from Last 3 Encounters:  04/25/14 197 lb (89.359 kg)  01/08/14 197 lb 12.8 oz (89.721 kg)  10/16/13 196 lb (88.905 kg)   General Appearance: Well nourished, in no apparent distress. Eyes: PERRLA, EOMs, conjunctiva no swelling or erythema Sinuses: No Frontal/maxillary tenderness ENT/Mouth: Ext aud canals clear, TMs without erythema, bulging. No erythema, swelling, or exudate on post pharynx.  Tonsils not swollen or erythematous. Hearing normal.  Neck: Supple, thyroid normal.  Respiratory: Respiratory effort normal, BS equal bilaterally without rales, rhonchi, wheezing or stridor.  Cardio: RRR with no MRGs. Brisk peripheral pulses without edema.  Abdomen: Soft, + BS.  Non tender, no guarding, rebound, hernias, masses. Lymphatics: Non  tender without lymphadenopathy.  Musculoskeletal: Full ROM, 5/5 strength, normal gait.  Skin: Warm, dry without rashes, lesions, ecchymosis.  Neuro: Cranial nerves intact. Normal muscle tone, no cerebellar symptoms. Sensation intact.  Psych: Awake and oriented X 3, normal affect, Insight and Judgment appropriate.    Vicie Mutters, PA-C 9:39 AM [facility]

## 2014-04-25 NOTE — Patient Instructions (Signed)
Rest and stay hydrated.  Make sure you drink plenty of fluids to make sure urine is clear when you urinate.  Water will help thin out mucous. - Take Mucinex DM- Maximum Strength over the counter to thin out and cough up the thick mucous.  Please follow directions on box. -Take Albuterol if prescribed.  I will give you a prescription for an antibiotic, but please only take it if you are not feeling better in 7-10 days.  Bronchitis is mostly caused by viruses and the antibiotic will do nothing.  Risk of antibiotic use: About 1 in 4 people who take antibiotics have side effects including stomach problems, dizziness, or rashes. Those problems clear up soon after stopping the drugs, but in rare cases antibiotics can cause severe allergic reaction. Over use of antibiotics also encourages the growth of bacteria that can't be controlled easily with drugs. That makes you more vunerable to antibiotic-resistant infections and undermines the benefits of antibiotics for others.   Waste of Money: Antibiotics often aren't very expensive, but any money spent on unnecessary drugs is money down the drain.   When are antibiotics needed? Only when symptoms last longer than a week.  Start to improve but then worsen again  Please call the office or message through My Chart if you have any questions.   Acute Bronchitis Bronchitis is when the airways that extend from the windpipe into the lungs get red, puffy, and painful (inflamed). Bronchitis often causes thick spit (mucus) to develop. This leads to a cough. A cough is the most common symptom of bronchitis. In acute bronchitis, the condition usually begins suddenly and goes away over time (usually in 2 weeks). Smoking, allergies, and asthma can make bronchitis worse. Repeated episodes of bronchitis may cause more lung problems.  Most common cause of Bronchitis is viruses (rhinovirus, coronavirus, RSV).  Therefore, not requiring an antibiotic; as antibiotics only  treat bacterial infections.  HOME CARE  Rest.  Drink enough fluids to keep your pee (urine) clear or pale yellow (unless you need to limit fluids as told by your doctor).  Only take over-the-counter or prescription medicines as told by your doctor.  Avoid smoking and secondhand smoke. These can make bronchitis worse. If you are a smoker, think about using nicotine gum or skin patches. Quitting smoking will help your lungs heal faster.  Reduce the chance of getting bronchitis again by:  Washing your hands often.  Avoiding people with cold symptoms.  Trying not to touch your hands to your mouth, nose, or eyes.  Follow up with your doctor as told. GET HELP IF: Your symptoms do not improve after 1 week of treatment. Symptoms include:  Cough.  Fever.  Coughing up thick spit.  Body aches.  Chest congestion.  Chills.  Shortness of breath.  Sore throat. GET HELP RIGHT AWAY IF:   You have an increased fever.  You have chills.  You have severe shortness of breath.  You have bloody thick spit (sputum).  You throw up (vomit) often.  You lose too much body fluid (dehydration).  You have a severe headache.  You faint. MAKE SURE YOU:   Understand these instructions.  Will watch your condition.  Will get help right away if you are not doing well or get worse. Document Released: 09/09/2007 Document Revised: 11/23/2012 Document Reviewed: 09/13/2012 [facility] Patient Information 2015 [place], [place]. This information is not intended to replace advice given to you by your health care provider. Make sure you discuss any questions  you have with your health care provider.   Ways to cut 100 calories  1. Eat your eggs with hot sauce OR salsa instead of cheese.  Eggs are great for breakfast, but many people consider eggs and cheese to be BFFs. Instead of cheese-1 oz. of cheddar has 114 calories-top your eggs with hot sauce, which contains no calories and helps with satiety  and metabolism. Salsa is also a great option!!  2. Top your toast, waffles or pancakes with mashed berries instead of jelly or syrup. Half a cup of berries-fresh, frozen or thawed-has about 40 calories, compared with 2 tbsp. of maple syrup or jelly, which both have about 100 calories. The berries will also give you a good punch of fiber, which helps keep you full and satisfied and won't spike blood sugar quickly like the jelly or syrup. 3. Swap the non-fat latte for black coffee with a splash of half-and-half. Contrary to its name, that non-fat latte has 130 calories and a startling 19g of carbohydrates per 16 oz. serving. Replacing that 'light' drinkable dessert with a black coffee with a splash of half-and-half saves you more than 100 calories per 16 oz. serving. 4. Sprinkle salads with freeze-dried raspberries instead of dried cranberries. If you want a sweet addition to your nutritious salad, stay away from dried cranberries. They have a whopping 130 calories per  cup and 30g carbohydrates. Instead, sprinkle freeze-dried raspberries guilt-free and save more than 100 calories per  cup serving, adding 3g of belly-filling fiber. 5. Go for mustard in place of mayo on your sandwich. Mustard can add really nice flavor to any sandwich, and there are tons of varieties, from spicy to honey. A serving of mayo is 95 calories, versus 10 calories in a serving of mustard. 6. Choose a DIY salad dressing instead of the store-bought kind. Mix Dijon or whole grain mustard with low-fat Kefir or red wine vinegar and garlic. 7. Use hummus as a spread instead of a dip. Use hummus as a spread on a high-fiber cracker or tortilla with a sandwich and save on calories without sacrificing taste. 8. Pick just one salad "accessory." Salad isn't automatically a calorie winner. It's easy to over-accessorize with toppings. Instead of topping your salad with nuts, avocado and cranberries (all three will clock in at 313 calories),  just pick one. The next day, choose a different accessory, which will also keep your salad interesting. You don't wear all your jewelry every day, right? 9. Ditch the white pasta in favor of spaghetti squash. One cup of cooked spaghetti squash has about 40 calories, compared with traditional spaghetti, which comes with more than 200. Spaghetti squash is also nutrient-dense. It's a good source of fiber and Vitamins A and C, and it can be eaten just like you would eat pasta-with a great tomato sauce and [place] meatballs or with pesto, tofu and spinach, for example. 10. Dress up your chili, soups and stews with non-fat [place] yogurt instead of sour cream. Just a 'dollop' of sour cream can set you back 115 calories and a whopping 12g of fat-seven of which are of the artery-clogging variety. Added bonus: [place] yogurt is packed with muscle-building protein, calcium and B Vitamins. 11. Mash cauliflower instead of mashed potatoes. One cup of traditional mashed potatoes-in all their creamy goodness-has more than 200 calories, compared to mashed cauliflower, which you can typically eat for less than 100 calories per 1 cup serving. Cauliflower is a great source of the antioxidant indole-3-carbinol (I3C), which  may help reduce the risk of some cancers, like breast cancer. 12. Ditch the ice cream sundae in favor of a [place] yogurt parfait. Instead of a cup of ice cream or fro-yo for dessert, try 1 cup of nonfat Greek yogurt topped with fresh berries and a sprinkle of cacao nibs. Both toppings are packed with antioxidants, which can help reduce cellular inflammation and oxidative damage. And the comparison is a no-brainer: One cup of ice cream has about 275 calories; one cup of frozen yogurt has about 230; and a cup of Greek yogurt has just 130, plus twice the protein, so you're less likely to return to the freezer for a second helping. 13. Put olive oil in a spray container instead of using it directly from the  bottle. Each tablespoon of olive oil is 120 calories and 15g of fat. Use a mister instead of pouring it straight into the pan or onto a salad. This allows for portion control and will save you more than 100 calories. 14. When baking, substitute canned pumpkin for butter or oil. Canned pumpkin-not pumpkin pie mix-is loaded with Vitamin A, which is important for skin and eye health, as well as immunity. And the comparisons are pretty crazy:  cup of canned pumpkin has about 40 calories, compared to butter or oil, which has more than 800 calories. Yes, 800 calories. Applesauce and mashed banana can also serve as good substitutions for butter or oil, usually in a 1:1 ratio. 15. Top casseroles with high-fiber cereal instead of breadcrumbs. Breadcrumbs are typically made with white bread, while breakfast cereals contain 5-9g of fiber per serving. Not only will you save more than 150 calories per  cup serving, the swap will also keep you more full and you'll get a metabolism boost from the added fiber. 16. Snack on pistachios instead of macadamia nuts. Believe it or not, you get the same amount of calories from 35 pistachios (100 calories) as you would from only five macadamia nuts. 17. Chow down on kale chips rather than potato chips. This is my favorite 'don't knock it 'till you try it' swap. Kale chips are so easy to make at home, and you can spice them up with a little grated parmesan or chili powder. Plus, they're a mere fraction of the calories of potato chips, but with the same crunch factor we crave so often. 18. Add seltzer and some fruit slices to your cocktail instead of soda or fruit juice. One cup of soda or fruit juice can pack on as much as 140 calories. Instead, use seltzer and fruit slices. The fruit provides valuable phytochemicals, such as flavonoids and anthocyanins, which help to combat cancer and stave off the aging process.

## 2014-04-26 LAB — INSULIN, FASTING: Insulin fasting, serum: 7.4 u[IU]/mL (ref 2.0–19.6)

## 2014-04-26 LAB — VITAMIN D 25 HYDROXY (VIT D DEFICIENCY, FRACTURES): Vit D, 25-Hydroxy: 66 ng/mL (ref 30–100)

## 2014-04-26 LAB — HEMOGLOBIN A1C
Hgb A1c MFr Bld: 5.7 % — ABNORMAL HIGH (ref <5.7)
Mean Plasma Glucose: 117 mg/dL — ABNORMAL HIGH (ref <117)

## 2014-05-18 ENCOUNTER — Encounter: Payer: Self-pay | Admitting: Gastroenterology

## 2014-05-29 ENCOUNTER — Encounter: Payer: Self-pay | Admitting: Gastroenterology

## 2014-06-14 ENCOUNTER — Other Ambulatory Visit: Payer: Self-pay

## 2014-06-14 MED ORDER — MOEXIPRIL HCL 15 MG PO TABS: ORAL_TABLET | ORAL | Status: DC

## 2014-06-26 ENCOUNTER — Other Ambulatory Visit: Payer: Self-pay | Admitting: Internal Medicine

## 2014-06-26 ENCOUNTER — Other Ambulatory Visit: Payer: Self-pay | Admitting: *Deleted

## 2014-06-26 DIAGNOSIS — E349 Endocrine disorder, unspecified: Secondary | ICD-10-CM

## 2014-06-26 MED ORDER — TESTOSTERONE 10 MG/ACT (2%) TD GEL: TRANSDERMAL | Status: DC

## 2014-06-27 ENCOUNTER — Other Ambulatory Visit: Payer: Self-pay | Admitting: *Deleted

## 2014-06-27 DIAGNOSIS — E349 Endocrine disorder, unspecified: Secondary | ICD-10-CM

## 2014-06-27 MED ORDER — TESTOSTERONE 10 MG/ACT (2%) TD GEL: TRANSDERMAL | Status: DC

## 2014-07-02 ENCOUNTER — Other Ambulatory Visit: Payer: Self-pay | Admitting: Internal Medicine

## 2014-07-19 ENCOUNTER — Ambulatory Visit (AMBULATORY_SURGERY_CENTER): Payer: Self-pay

## 2014-07-19 VITALS — Ht 70.0 in | Wt 196.2 lb

## 2014-07-19 DIAGNOSIS — Z1211 Encounter for screening for malignant neoplasm of colon: Secondary | ICD-10-CM

## 2014-07-19 MED ORDER — MOVIPREP 100 G PO SOLR: ORAL | Status: DC

## 2014-07-19 NOTE — Progress Notes (Signed)
Per pt, no allergies to soy or egg products.Pt not taking any weight loss meds or using  O2 at home. 

## 2014-07-27 ENCOUNTER — Ambulatory Visit (INDEPENDENT_AMBULATORY_CARE_PROVIDER_SITE_OTHER): Payer: 59 | Admitting: Internal Medicine

## 2014-07-27 ENCOUNTER — Encounter: Payer: Self-pay | Admitting: Internal Medicine

## 2014-07-27 VITALS — BP 110/76 | HR 64 | Temp 97.3°F | Resp 16 | Ht 68.75 in | Wt 196.0 lb

## 2014-07-27 DIAGNOSIS — E785 Hyperlipidemia, unspecified: Secondary | ICD-10-CM

## 2014-07-27 DIAGNOSIS — E291 Testicular hypofunction: Secondary | ICD-10-CM

## 2014-07-27 DIAGNOSIS — E559 Vitamin D deficiency, unspecified: Secondary | ICD-10-CM

## 2014-07-27 DIAGNOSIS — Z79899 Other long term (current) drug therapy: Secondary | ICD-10-CM

## 2014-07-27 DIAGNOSIS — R7303 Prediabetes: Secondary | ICD-10-CM

## 2014-07-27 DIAGNOSIS — R7309 Other abnormal glucose: Secondary | ICD-10-CM

## 2014-07-27 DIAGNOSIS — I10 Essential (primary) hypertension: Secondary | ICD-10-CM

## 2014-07-27 LAB — HEPATIC FUNCTION PANEL
ALT: 25 U/L (ref 0–53)
AST: 36 U/L (ref 0–37)
Albumin: 4.4 g/dL (ref 3.5–5.2)
Alkaline Phosphatase: 74 U/L (ref 39–117)
Bilirubin, Direct: 0.1 mg/dL (ref 0.0–0.3)
Indirect Bilirubin: 0.3 mg/dL (ref 0.2–1.2)
Total Bilirubin: 0.4 mg/dL (ref 0.2–1.2)
Total Protein: 6.7 g/dL (ref 6.0–8.3)

## 2014-07-27 LAB — CBC WITH DIFFERENTIAL/PLATELET
Basophils Absolute: 0 10*3/uL (ref 0.0–0.1)
Basophils Relative: 1 % (ref 0–1)
Eosinophils Absolute: 0.2 10*3/uL (ref 0.0–0.7)
Eosinophils Relative: 6 % — ABNORMAL HIGH (ref 0–5)
HCT: 44.5 % (ref 39.0–52.0)
Hemoglobin: 14.9 g/dL (ref 13.0–17.0)
Lymphocytes Relative: 37 % (ref 12–46)
Lymphs Abs: 1.4 10*3/uL (ref 0.7–4.0)
MCH: 28.7 pg (ref 26.0–34.0)
MCHC: 33.5 g/dL (ref 30.0–36.0)
MCV: 85.6 fL (ref 78.0–100.0)
MPV: 9.5 fL (ref 8.6–12.4)
Monocytes Absolute: 0.5 10*3/uL (ref 0.1–1.0)
Monocytes Relative: 13 % — ABNORMAL HIGH (ref 3–12)
Neutro Abs: 1.7 10*3/uL (ref 1.7–7.7)
Neutrophils Relative %: 43 % (ref 43–77)
Platelets: 258 10*3/uL (ref 150–400)
RBC: 5.2 MIL/uL (ref 4.22–5.81)
RDW: 13.7 % (ref 11.5–15.5)
WBC: 3.9 10*3/uL — ABNORMAL LOW (ref 4.0–10.5)

## 2014-07-27 LAB — LIPID PANEL
Cholesterol: 138 mg/dL (ref 0–200)
HDL: 46 mg/dL (ref >=40)
LDL Cholesterol: 73 mg/dL (ref 0–99)
Total CHOL/HDL Ratio: 3 Ratio
Triglycerides: 95 mg/dL (ref <150)
VLDL: 19 mg/dL (ref 0–40)

## 2014-07-27 LAB — BASIC METABOLIC PANEL WITH GFR
BUN: 15 mg/dL (ref 6–23)
CO2: 25 mEq/L (ref 19–32)
Calcium: 9.6 mg/dL (ref 8.4–10.5)
Chloride: 103 mEq/L (ref 96–112)
Creat: 1.07 mg/dL (ref 0.50–1.35)
GFR, Est African American: 88 mL/min
GFR, Est Non African American: 76 mL/min
Glucose, Bld: 66 mg/dL — ABNORMAL LOW (ref 70–99)
Potassium: 4.6 mEq/L (ref 3.5–5.3)
Sodium: 140 mEq/L (ref 135–145)

## 2014-07-27 LAB — HEMOGLOBIN A1C
Hgb A1c MFr Bld: 5.8 % — ABNORMAL HIGH (ref <5.7)
Mean Plasma Glucose: 120 mg/dL — ABNORMAL HIGH (ref <117)

## 2014-07-27 LAB — TSH: TSH: 1.464 u[IU]/mL (ref 0.350–4.500)

## 2014-07-27 LAB — MAGNESIUM: Magnesium: 2 mg/dL (ref 1.5–2.5)

## 2014-07-27 NOTE — Patient Instructions (Signed)

## 2014-07-27 NOTE — Progress Notes (Signed)
Patient ID: Gerald [name], male   DOB: 03-Jul-1955, 59 y.o.   MRN: [id number]   This very nice 59 y.o. [name] presents for 58 month follow up with Hypertension, Hyperlipidemia, Pre-Diabetes, Testosterone and Vitamin D Deficiency.    Patient is treated for HTN since 1999  & BP has been controlled at home. Today's BP: 110/76 mmHg. Patient has had no complaints of any cardiac type chest pain, palpitations, dyspnea/orthopnea/PND, dizziness, claudication, or dependent edema. Patient also has Stage 3 CKD with GFR 69 ml/min.   Hyperlipidemia is controlled with diet & meds. Patient denies myalgias or other med SE's. Last Lipids were at goal - Total Chol  129; HDL 38; LDL  71; Trig 98 on 04/25/2014.   Also, the patient has history of PreDiabetes with an A1c 5.7% in 2013 and has had no symptoms of reactive hypoglycemia, diabetic polys, paresthesias or visual blurring.  Last A1c was 5.7% on 04/25/2014.   Patient remains on Testosterone replacement with improved sense of well-being. Further, the patient also has history of Vitamin D Deficiency and supplements vitamin D without any suspected side-effects. Last vitamin D was 66 on 04/25/2014.    Medication Sig  . ALPRAZolam (XANAX) 0.5 MG tablet as needed.   . Ascorbic Acid (VITAMIN C) 1000 MG tablet Take 1,000 mg by mouth 2 (two) times daily.  Marland Kitchen aspirin EC 81 MG tablet Take 1 tablet (81 mg total) by mouth daily.  . bifidobacterium infantis (ALIGN) capsule TAKE ONE PILL BY MOUTH ONCE DAILY  . bisacodyl (DULCOLAX) 5 MG EC tablet Take 5 mg by mouth. Dulcolax 5 mg bowel prep #4-Take as directed  . Cholecalciferol (VITAMIN D-3) 5000 UNITS TABS Take 2,000 Units by mouth daily.   Marland Kitchen CIALIS 5 MG tablet TAKE 1 TABLET DAILY FOR    PROSTATISM  . clobetasol cream (TEMOVATE) 0.05 % as needed.   . cyclobenzaprine (FLEXERIL) 10 MG tablet Take 10 mg by mouth as needed for muscle spasms.  . Flaxseed, Linseed, (FLAX SEED OIL PO) Take 1,000 mg by mouth 3 (three) times daily.   .  Glucosamine-Chondroitin (GLUCOSAMINE CHONDR COMPLEX PO) Take by mouth. Take 2 tablets daily  . isometheptene-acetaminophen-dichloralphenazone (MIDRIN) 65-325-100 MG capsule TAKE 1-2 CAPSULE BY MOUTH EVERY 4 HR AS NEEDED FOR HEADACHE (Patient not taking: Reported on 07/19/2014)  . magnesium 30 MG tablet Take 1,000 mg by mouth daily.   . Melatonin 10 MG CAPS Take by mouth Nightly.  . meloxicam (MOBIC) 15 MG tablet Take 15 mg by mouth as needed for pain.  . moexipril (UNIVASC) 15 MG tablet TAKE 1 TABLET DAILY FOR    BLOOD PRESSURE  . MOVIPREP 100 G SOLR Moviprep as directed / no substitutions  . FISH OIL  Take 1,000 mg by mouth 3 (three) times daily.   Marland Kitchen MIRALAX powder Take 1 Container by mouth once. Miralax bowel prep 105 gms -Take as directed  . pregabalin (LYRICA) 200 MG capsule Take 200 mg by mouth 2 (two) times daily.  . rosuvastatin (CRESTOR) 40 MG tablet Take 1 tablet (40 mg total) by mouth daily. (Patient taking differently: Take 20 mg by mouth daily. )  . TAZORAC 0.05 % cream as needed.   . Testosterone (FORTESTA) 10 MG/ACT (2%) GEL Apply 3 pumps to each thigh daily.  . vitamin B-12  100 MCG tablet Take 100 mcg by mouth daily.  Marland Kitchen zolmitriptan (ZOMIG) 5 MG tablet as needed.    No Known Allergies  PMHx:   Past Medical History  Diagnosis  Date  . Hypertension   . Hyperlipidemia   . Vitamin D deficiency   . Pre-diabetes     no meds  . Hypogonadism, male    Immunization History  Administered Date(s) Administered  . Influenza Split 01/08/2014  . Influenza Whole 01/05/2013  . PPD Test 01/08/2014  . Tdap 06/15/2008   Past Surgical History  Procedure Laterality Date  . Knee surgery      left  . Esophagogastroduodenoscopy    . Orif acetabular fracture      left arm 2 times   FHx:    Reviewed / unchanged  SHx:    Reviewed / unchanged  Systems Review:  Constitutional: Denies fever, chills, wt changes, headaches, insomnia, fatigue, night sweats, change in appetite. Eyes:  Denies redness, blurred vision, diplopia, discharge, itchy, watery eyes.  ENT: Denies discharge, congestion, post nasal drip, epistaxis, sore throat, earache, hearing loss, dental pain, tinnitus, vertigo, sinus pain, snoring.  CV: Denies chest pain, palpitations, irregular heartbeat, syncope, dyspnea, diaphoresis, orthopnea, PND, claudication or edema. Respiratory: denies cough, dyspnea, DOE, pleurisy, hoarseness, laryngitis, wheezing.  Gastrointestinal: Denies dysphagia, odynophagia, heartburn, reflux, water brash, abdominal pain or cramps, nausea, vomiting, bloating, diarrhea, constipation, hematemesis, melena, hematochezia  or hemorrhoids. Genitourinary: Denies dysuria, frequency, urgency, nocturia, hesitancy, discharge, hematuria or flank pain. Musculoskeletal: Denies arthralgias, myalgias, stiffness, jt. swelling, pain, limping or strain/sprain.  Skin: Denies pruritus, rash, hives, warts, acne, eczema or change in skin lesion(s). Neuro: No weakness, tremor, incoordination, spasms, paresthesia or pain. Psychiatric: Denies confusion, memory loss or sensory loss. Endo: Denies change in weight, skin or hair change.  Heme/Lymph: No excessive bleeding, bruising or enlarged lymph nodes.  Physical Exam  BP 110/76   Pulse 64  Temp 97.3 F   Resp 16  Ht 5' 8.75"   Wt 196 lb     BMI 29.16  Appears well nourished and in no distress. Eyes: PERRLA, EOMs, conjunctiva no swelling or erythema. Sinuses: No frontal/maxillary tenderness ENT/Mouth: EAC's clear, TM's nl w/o erythema, bulging. Nares clear w/o erythema, swelling, exudates. Oropharynx clear without erythema or exudates. Oral hygiene is good. Tongue normal, non obstructing. Hearing intact.  Neck: Supple. Thyroid nl. Car 2+/2+ without bruits, nodes or JVD. Chest: Respirations nl with BS clear & equal w/o rales, rhonchi, wheezing or stridor.  Cor: Heart sounds normal w/ regular rate and rhythm without sig. murmurs, gallops, clicks, or rubs.  Peripheral pulses normal and equal  without edema.  Abdomen: Soft & bowel sounds normal. Non-tender w/o guarding, rebound, hernias, masses, or organomegaly.  Lymphatics: Unremarkable.  Musculoskeletal: Full ROM all peripheral extremities, joint stability, 5/5 strength, and normal gait.  Skin: Warm, dry without exposed rashes, lesions or ecchymosis apparent.  Neuro: Cranial nerves intact, reflexes equal bilaterally. Sensory-motor testing grossly intact. Tendon reflexes grossly intact.  Pysch: Alert & oriented x 3.  Insight and judgement nl & appropriate. No ideations.  Assessment and Plan:  1. Essential hypertension  - TSH  2. Hyperlipidemia  - Lipid panel  3. Pre-diabetes  - Hemoglobin A1c - Insulin, random  4. Vitamin D deficiency  - Vit D  25 hydroxy   5. Testosterone Deficiency  - Testosterone  6. Medication management  - CBC with Differential/Platelet - BASIC METABOLIC PANEL WITH GFR - Hepatic function panel - Magnesium   Recommended regular exercise, BP monitoring, weigt control, and discussed med and SE's. Recommended labs to assess and monitor clinical status. Further disposition pending results of labs. Over 30 minutes of exam, counseling, chart review was performed

## 2014-07-28 LAB — INSULIN, RANDOM: Insulin: 9.6 u[IU]/mL (ref 2.0–19.6)

## 2014-07-28 LAB — TESTOSTERONE: Testosterone: 361 ng/dL (ref 300–890)

## 2014-07-28 LAB — VITAMIN D 25 HYDROXY (VIT D DEFICIENCY, FRACTURES): Vit D, 25-Hydroxy: 49 ng/mL (ref 30–100)

## 2014-08-02 ENCOUNTER — Encounter: Payer: Self-pay | Admitting: Gastroenterology

## 2014-08-02 ENCOUNTER — Ambulatory Visit (AMBULATORY_SURGERY_CENTER): Payer: 59 | Admitting: Gastroenterology

## 2014-08-02 VITALS — BP 113/67 | HR 38 | Resp 18 | Ht 70.0 in | Wt 196.0 lb

## 2014-08-02 DIAGNOSIS — Z1211 Encounter for screening for malignant neoplasm of colon: Secondary | ICD-10-CM

## 2014-08-02 MED ORDER — SODIUM CHLORIDE 0.9 % IV SOLN: 500.0000 mL | INTRAVENOUS | Status: DC

## 2014-08-02 NOTE — Progress Notes (Signed)
Procedure ends, to recovery, report to Nyra Capes, RN. VSS.

## 2014-08-02 NOTE — Patient Instructions (Signed)
YOU HAD AN ENDOSCOPIC PROCEDURE TODAY AT [facility]:   Refer to the procedure report that was given to you for any specific questions about what was found during the examination.  If the procedure report does not answer your questions, please call your gastroenterologist to clarify.  If you requested that your care partner not be given the details of your procedure findings, then the procedure report has been included in a sealed envelope for you to review at your convenience later.  YOU SHOULD EXPECT: Some feelings of bloating in the abdomen. Passage of more gas than usual.  Walking can help get rid of the air that was put into your GI tract during the procedure and reduce the bloating. If you had a lower endoscopy (such as a colonoscopy or flexible sigmoidoscopy) you may notice spotting of blood in your stool or on the toilet paper. If you underwent a bowel prep for your procedure, you may not have a normal bowel movement for a few days.  Please Note:  You might notice some irritation and congestion in your nose or some drainage.  This is from the oxygen used during your procedure.  There is no need for concern and it should clear up in a day or so.  SYMPTOMS TO REPORT IMMEDIATELY:   Following lower endoscopy (colonoscopy or flexible sigmoidoscopy):  Excessive amounts of blood in the stool  Significant tenderness or worsening of abdominal pains  Swelling of the abdomen that is new, acute  Fever of 100F or higher   For urgent or emergent issues, a gastroenterologist can be reached at any hour by calling [phone].   DIET: Your first meal following the procedure should be a small meal and then it is ok to progress to your normal diet. Heavy or fried foods are harder to digest and may make you feel nauseous or bloated.  Likewise, meals heavy in dairy and vegetables can increase bloating.  Drink plenty of fluids but you should avoid alcoholic beverages for 24  hours.  ACTIVITY:  You should plan to take it easy for the rest of today and you should NOT DRIVE or use heavy machinery until tomorrow (because of the sedation medicines used during the test).    FOLLOW UP: Our staff will call the number listed on your records the next business day following your procedure to check on you and address any questions or concerns that you may have regarding the information given to you following your procedure. If we do not reach you, we will leave a message.  However, if you are feeling well and you are not experiencing any problems, there is no need to return our call.  We will assume that you have returned to your regular daily activities without incident.  If any biopsies were taken you will be contacted by phone or by letter within the next 1-3 weeks.  Please call us at [phone] if you have not heard about the biopsies in 3 weeks.    SIGNATURES/CONFIDENTIALITY: You and/or your care partner have signed paperwork which will be entered into your electronic medical record.  These signatures attest to the fact that that the information above on your After Visit Summary has been reviewed and is understood.  Full responsibility of the confidentiality of this discharge information lies with you and/or your care-partner.  Heart-rate is irregular, and PVC's are noted. Please, show the strip to your PCP to see if follow-up is necessary.

## 2014-08-02 NOTE — Op Note (Signed)
[facility]  [address] [place], [id number]   COLONOSCOPY PROCEDURE REPORT  PATIENT: Mancil, Pfenning  MR#: [id number] BIRTHDATE: [birth year] , 56  yrs. old GENDER: male ENDOSCOPIST: Ladene Artist, MD, [facility] REFERRED JD:BZMCEYE Melford Aase, M.D. PROCEDURE DATE:  08/02/2014 PROCEDURE:   Colonoscopy, screening First Screening Colonoscopy - Avg.  risk and is 50 yrs.  old or older - No.  Prior Negative Screening - Now for repeat screening. Less than 10 yrs Prior Negative Screening - Now for repeat screening.  Inadequate prep  History of Adenoma - Now for follow-up colonoscopy & has been > or = to 3 yrs.  N/A ASA CLASS:   Class II INDICATIONS:Screening for colonic neoplasia and Colorectal Neoplasm Risk Assessment for this procedure is average risk. MEDICATIONS: Monitored anesthesia care and Propofol 250 mg IV DESCRIPTION OF PROCEDURE:   After the risks benefits and alternatives of the procedure were thoroughly explained, informed consent was obtained.  The digital rectal exam revealed no abnormalities of the rectum.   The LB [id number]  endoscope was introduced through the anus and advanced to the cecum, which was identified by both the appendix and ileocecal valve. No adverse events experienced with a tortuous colon.   The quality of the prep was good after extensive rinsing and suctioning.  (MoviPrep was used)  The instrument was then slowly withdrawn as the colon was fully examined.    COLON FINDINGS: A normal appearing cecum, ileocecal valve, and appendiceal orifice were identified.  The ascending, transverse, descending, sigmoid colon, and rectum appeared unremarkable. Retroflexed views revealed internal Grade I hemorrhoids. The time to cecum = 2.8 Withdrawal time = 11.4   The scope was withdrawn and the procedure completed. COMPLICATIONS: There were no immediate complications.  ENDOSCOPIC IMPRESSION: 1.  Normal colonoscopy 2.  Grade I internal  hemorrhoids  RECOMMENDATIONS: 1.  Continue to follow colorectal cancer screening guidelines for "routine risk" patients with a repeat colonoscopy in 10 years. There is no need for routine, screening FOBT (stool) testing for at least 5 years.  eSigned:  Ladene Artist, MD, [facility] 08/02/2014 9:49 AM

## 2014-08-03 ENCOUNTER — Telehealth: Payer: Self-pay

## 2014-08-03 ENCOUNTER — Other Ambulatory Visit: Payer: Self-pay | Admitting: Internal Medicine

## 2014-08-03 NOTE — Telephone Encounter (Signed)
Left message on answering machine. 

## 2014-08-15 ENCOUNTER — Other Ambulatory Visit: Payer: Self-pay | Admitting: Internal Medicine

## 2014-08-24 ENCOUNTER — Other Ambulatory Visit: Payer: Self-pay | Admitting: Internal Medicine

## 2014-10-29 ENCOUNTER — Ambulatory Visit: Payer: Self-pay | Admitting: Physician Assistant

## 2014-11-01 ENCOUNTER — Ambulatory Visit: Payer: Self-pay | Admitting: Physician Assistant

## 2014-11-02 ENCOUNTER — Encounter: Payer: Self-pay | Admitting: Physician Assistant

## 2014-11-02 ENCOUNTER — Ambulatory Visit (INDEPENDENT_AMBULATORY_CARE_PROVIDER_SITE_OTHER): Payer: 59 | Admitting: Physician Assistant

## 2014-11-02 VITALS — BP 110/68 | Temp 97.9°F | Resp 16 | Ht 68.75 in | Wt 194.4 lb

## 2014-11-02 DIAGNOSIS — E559 Vitamin D deficiency, unspecified: Secondary | ICD-10-CM

## 2014-11-02 DIAGNOSIS — E785 Hyperlipidemia, unspecified: Secondary | ICD-10-CM

## 2014-11-02 DIAGNOSIS — R7303 Prediabetes: Secondary | ICD-10-CM

## 2014-11-02 DIAGNOSIS — I10 Essential (primary) hypertension: Secondary | ICD-10-CM

## 2014-11-02 DIAGNOSIS — R7309 Other abnormal glucose: Secondary | ICD-10-CM

## 2014-11-02 DIAGNOSIS — Z79899 Other long term (current) drug therapy: Secondary | ICD-10-CM

## 2014-11-02 DIAGNOSIS — R001 Bradycardia, unspecified: Secondary | ICD-10-CM

## 2014-11-02 DIAGNOSIS — E291 Testicular hypofunction: Secondary | ICD-10-CM

## 2014-11-02 LAB — CBC WITH DIFFERENTIAL/PLATELET
Basophils Absolute: 0.1 10*3/uL (ref 0.0–0.1)
Basophils Relative: 1 % (ref 0–1)
Eosinophils Absolute: 0.4 10*3/uL (ref 0.0–0.7)
Eosinophils Relative: 6 % — ABNORMAL HIGH (ref 0–5)
HCT: 47.8 % (ref 39.0–52.0)
Hemoglobin: 15.5 g/dL (ref 13.0–17.0)
Lymphocytes Relative: 27 % (ref 12–46)
Lymphs Abs: 1.7 10*3/uL (ref 0.7–4.0)
MCH: 27.3 pg (ref 26.0–34.0)
MCHC: 32.4 g/dL (ref 30.0–36.0)
MCV: 84.3 fL (ref 78.0–100.0)
MPV: 9 fL (ref 8.6–12.4)
Monocytes Absolute: 0.4 10*3/uL (ref 0.1–1.0)
Monocytes Relative: 7 % (ref 3–12)
Neutro Abs: 3.7 10*3/uL (ref 1.7–7.7)
Neutrophils Relative %: 59 % (ref 43–77)
Platelets: 267 10*3/uL (ref 150–400)
RBC: 5.67 MIL/uL (ref 4.22–5.81)
RDW: 14.8 % (ref 11.5–15.5)
WBC: 6.2 10*3/uL (ref 4.0–10.5)

## 2014-11-02 LAB — HEMOGLOBIN A1C
Hgb A1c MFr Bld: 5.8 % — ABNORMAL HIGH (ref <5.7)
Mean Plasma Glucose: 120 mg/dL — ABNORMAL HIGH (ref <117)

## 2014-11-02 NOTE — Addendum Note (Signed)
Addended by: Vicie Mutters R on: 11/02/2014 11:37 AM   Modules accepted: Miquel Dunn

## 2014-11-02 NOTE — Progress Notes (Signed)
Assessment and Plan:  Hypertension: Continue medication, monitor blood pressure at home. Continue DASH diet.  Reminder to go to the ER if any CP, SOB, nausea, dizziness, severe HA, changes vision/speech, left arm numbness and tingling, and jaw pain. Cholesterol: Continue diet and exercise. Check cholesterol.  Pre-diabetes-Continue diet and exercise. Check A1C Vitamin D Def- check level and continue medications. Hypogonadism- continue replacement therapy, check testosterone levels as needed.   Bradycardia- EKG shows sinus brady with occ bigemeny, no ST changes, monitor for symptoms.    Continue diet and meds as discussed. Further disposition pending results of labs.  HPI 59 y.o. male  presents for 3 month follow up with hypertension, hyperlipidemia, prediabetes and vitamin D. His blood pressure has been controlled at home, today their BP is BP: 110/68 mmHg He does workout, runs two in a half miles 5 days a week, denies chest pain, shortness of breath, dizziness however his pulse is 36 and irreg.  He is on crestor 40 1/2 pill QD and denies myalgias. His cholesterol is at goal. The cholesterol last visit was:   Lab Results  Component Value Date   CHOL 138 07/27/2014   HDL 46 07/27/2014   LDLCALC 73 07/27/2014   TRIG 95 07/27/2014   CHOLHDL 3.0 07/27/2014  He has been working on diet and exercise for prediabetes, and denies paresthesia of the feet, polydipsia, polyuria and visual disturbances. Last A1C in the office was:  Lab Results  Component Value Date   HGBA1C 5.8* 07/27/2014  Patient is on Vitamin D supplement.   Lab Results  Component Value Date   VD25OH 48 07/27/2014  He has a history of testosterone deficiency and is on testosterone replacement. He states that the testosterone helps with his energy, libido, muscle mass. Lab Results  Component Value Date   TESTOSTERONE 361 07/27/2014     Current Medications:  Current Outpatient Prescriptions on File Prior to Visit   Medication Sig Dispense Refill  . ALPRAZolam (XANAX) 0.5 MG tablet as needed.     . Ascorbic Acid (VITAMIN C) 1000 MG tablet Take 1,000 mg by mouth 2 (two) times daily.    Marland Kitchen aspirin EC 81 MG tablet Take 1 tablet (81 mg total) by mouth daily. 150 tablet 2  . bisacodyl (DULCOLAX) 5 MG EC tablet Take 5 mg by mouth. Dulcolax 5 mg bowel prep #4-Take as directed    . Cholecalciferol (VITAMIN D-3) 5000 UNITS TABS Take 2,000 Units by mouth daily.     Marland Kitchen CIALIS 5 MG tablet TAKE 1 TABLET DAILY FOR    PROSTATISM 90 tablet 99  . clobetasol cream (TEMOVATE) 0.05 % APPLY TO RASH TWICE DAILY AS NEEDED 60 g 99  . cyclobenzaprine (FLEXERIL) 10 MG tablet Take 10 mg by mouth as needed for muscle spasms.    . Flaxseed, Linseed, (FLAX SEED OIL PO) Take 1,000 mg by mouth 3 (three) times daily.     . Glucosamine-Chondroitin (GLUCOSAMINE CHONDR COMPLEX PO) Take by mouth. Take 2 tablets daily    . isometheptene-acetaminophen-dichloralphenazone (MIDRIN) 65-325-100 MG capsule TAKE 1-2 CAPSULE BY MOUTH EVERY 4 HR AS NEEDED FOR HEADACHE (Patient not taking: Reported on 08/02/2014) 100 capsule 0  . Magnesium 500 MG TABS Take 2 tablets by mouth daily.    . Melatonin 10 MG CAPS Take by mouth Nightly.    . meloxicam (MOBIC) 15 MG tablet Take 15 mg by mouth as needed for pain.    . moexipril (UNIVASC) 15 MG tablet TAKE 1 TABLET DAILY FOR  BLOOD PRESSURE 90 tablet 3  . Omega-3 Fatty Acids (FISH OIL PO) Take 1,000 mg by mouth 3 (three) times daily.     . pregabalin (LYRICA) 200 MG capsule Take 200 mg by mouth 2 (two) times daily.    . rosuvastatin (CRESTOR) 40 MG tablet Take 1 tablet (40 mg total) by mouth daily. (Patient taking differently: Take 20 mg by mouth daily. ) 90 tablet 3  . TAZORAC 0.05 % cream as needed.     . Testosterone (FORTESTA) 10 MG/ACT (2%) GEL Apply 3 pumps to each thigh daily. 180 g 5  . vitamin B-12 (CYANOCOBALAMIN) 100 MCG tablet Take 100 mcg by mouth daily.    Marland Kitchen zolmitriptan (ZOMIG) 5 MG tablet as  needed.   0   No current facility-administered medications on file prior to visit.   Medical History:  Past Medical History  Diagnosis Date  . Hypertension   . Hyperlipidemia   . Vitamin D deficiency   . Pre-diabetes     no meds  . Hypogonadism, male    Allergies: No Known Allergies   Review of Systems:  Review of Systems  Constitutional: Negative.  Negative for malaise/fatigue.  HENT: Negative.   Eyes: Negative.   Respiratory: Negative for shortness of breath.   Cardiovascular: Negative.  Negative for chest pain.  Gastrointestinal: Negative.   Genitourinary: Negative.   Musculoskeletal: Negative.   Skin: Negative.   Neurological: Negative.   Endo/Heme/Allergies: Negative.   Psychiatric/Behavioral: Negative.      Family history- Review and unchanged Social history- Review and unchanged Physical Exam: BP 110/68 mmHg  Temp(Src) 97.9 F (36.6 C)  Resp 16  Ht 5' 8.75" (1.746 m)  Wt 194 lb 6.4 oz (88.179 kg)  BMI 28.93 kg/m2 Wt Readings from Last 3 Encounters:  11/02/14 194 lb 6.4 oz (88.179 kg)  08/02/14 196 lb (88.905 kg)  07/27/14 196 lb (88.905 kg)   General Appearance: Well nourished, in no apparent distress. Eyes: PERRLA, EOMs, conjunctiva no swelling or erythema Sinuses: No Frontal/maxillary tenderness ENT/Mouth: Ext aud canals clear, TMs without erythema, bulging. No erythema, swelling, or exudate on post pharynx.  Tonsils not swollen or erythematous. Hearing normal.  Neck: Supple, thyroid normal.  Respiratory: Respiratory effort normal, BS equal bilaterally without rales, rhonchi, wheezing or stridor.  Cardio: sinus brady with no MRGs. Brisk peripheral pulses without edema.  Abdomen: Soft, + BS.  Non tender, no guarding, rebound, hernias, masses. Lymphatics: Non tender without lymphadenopathy.  Musculoskeletal: Full ROM, 5/5 strength, normal gait.  Skin: Warm, dry without rashes, lesions, ecchymosis.  Neuro: Cranial nerves intact. Normal muscle tone, no  cerebellar symptoms. Sensation intact.  Psych: Awake and oriented X 3, normal affect, Insight and Judgment appropriate.    Vicie Mutters, PA-C 10:58 AM [facility]

## 2014-11-02 NOTE — Patient Instructions (Signed)
Bradycardia °Bradycardia is a term for a heart rate (pulse) that, in adults, is slower than 60 beats per minute. A normal rate is 60 to 100 beats per minute. A heart rate below 60 beats per minute may be normal for some adults with healthy hearts. If the rate is too slow, the heart may have trouble pumping the volume of blood the body needs. If the heart rate gets too low, blood flow to the brain may be decreased and may make you feel lightheaded, dizzy, or faint. °The heart has a natural pacemaker in the top of the heart called the SA node (sinoatrial or sinus node). This pacemaker sends out regular electrical signals to the muscle of the heart, telling the heart muscle when to beat (contract). The electrical signal travels from the upper parts of the heart (atria) through the AV node (atrioventricular node), to the lower chambers of the heart (ventricles). The ventricles squeeze, pumping the blood from your heart to your lungs and to the rest of your body. °CAUSES  °· Problem with the heart's electrical system. °· Problem with the heart's natural pacemaker. °· Heart disease, damage, or infection. °· Medications. °· Problems with minerals and salts (electrolytes). °SYMPTOMS  °· Fainting (syncope). °· Fatigue and weakness. °· Shortness of breath (dyspnea). °· Chest pain (angina). °· Drowsiness. °· Confusion. °DIAGNOSIS  °· An electrocardiogram (ECG) can help your caregiver determine the type of slow heart rate you have. °· If the cause is not seen on an ECG, you may need to wear a heart monitor that records your heart rhythm for several hours or days. °· Blood tests. °TREATMENT  °· Electrolyte supplements. °· Medications. °· Withholding medication which is causing a slow heart rate. °· Pacemaker placement. °SEEK IMMEDIATE MEDICAL CARE IF:  °· You feel lightheaded or faint. °· You develop an irregular heart rate. °· You feel chest pain or have trouble breathing. °MAKE SURE YOU:  °· Understand these  instructions. °· Will watch your condition. °· Will get help right away if you are not doing well or get worse. °Document Released: 12/13/2001 Document Revised: 06/15/2011 Document Reviewed: 06/28/2013 °Exit[employer]® Patient Information ©2015 [employer]. This information is not intended to replace advice given to you by your health care provider. Make sure you discuss any questions you have with your health care provider. ° °

## 2014-11-03 LAB — BASIC METABOLIC PANEL WITH GFR
BUN: 14 mg/dL (ref 7–25)
CO2: 27 mmol/L (ref 20–31)
Calcium: 9.5 mg/dL (ref 8.6–10.3)
Chloride: 103 mmol/L (ref 98–110)
Creat: 1.1 mg/dL (ref 0.70–1.33)
GFR, Est African American: 85 mL/min (ref >=60)
GFR, Est Non African American: 74 mL/min (ref >=60)
Glucose, Bld: 88 mg/dL (ref 65–99)
Potassium: 4.3 mmol/L (ref 3.5–5.3)
Sodium: 138 mmol/L (ref 135–146)

## 2014-11-03 LAB — HEPATIC FUNCTION PANEL
ALT: 22 U/L (ref 9–46)
AST: 27 U/L (ref 10–35)
Albumin: 4.4 g/dL (ref 3.6–5.1)
Alkaline Phosphatase: 65 U/L (ref 40–115)
Bilirubin, Direct: 0.1 mg/dL (ref <=0.2)
Indirect Bilirubin: 0.3 mg/dL (ref 0.2–1.2)
Total Bilirubin: 0.4 mg/dL (ref 0.2–1.2)
Total Protein: 6.5 g/dL (ref 6.1–8.1)

## 2014-11-03 LAB — LIPID PANEL
Cholesterol: 117 mg/dL — ABNORMAL LOW (ref 125–200)
HDL: 43 mg/dL (ref >=40)
LDL Cholesterol: 49 mg/dL (ref <130)
Total CHOL/HDL Ratio: 2.7 Ratio (ref <=5.0)
Triglycerides: 126 mg/dL (ref <150)
VLDL: 25 mg/dL (ref <30)

## 2014-11-03 LAB — INSULIN, FASTING: Insulin fasting, serum: 3.2 u[IU]/mL (ref 2.0–19.6)

## 2014-11-03 LAB — MAGNESIUM: Magnesium: 2 mg/dL (ref 1.5–2.5)

## 2014-11-05 LAB — TESTOSTERONE: Testosterone: 504 ng/dL (ref 300–890)

## 2014-11-05 LAB — VITAMIN D 25 HYDROXY (VIT D DEFICIENCY, FRACTURES): Vit D, 25-Hydroxy: 47 ng/mL (ref 30–100)

## 2014-11-05 LAB — TSH: TSH: 1.161 u[IU]/mL (ref 0.350–4.500)

## 2014-11-21 ENCOUNTER — Other Ambulatory Visit: Payer: Self-pay | Admitting: Internal Medicine

## 2014-11-22 NOTE — Telephone Encounter (Signed)
Rx called in to [facility]. 

## 2014-11-28 ENCOUNTER — Other Ambulatory Visit: Payer: Self-pay | Admitting: *Deleted

## 2014-11-28 MED ORDER — ROSUVASTATIN CALCIUM 40 MG PO TABS: 40.0000 mg | ORAL_TABLET | Freq: Every day | ORAL | Status: DC

## 2015-01-13 ENCOUNTER — Encounter: Payer: Self-pay | Admitting: Internal Medicine

## 2015-01-13 DIAGNOSIS — E663 Overweight: Secondary | ICD-10-CM | POA: Insufficient documentation

## 2015-01-13 NOTE — Progress Notes (Signed)
Patient ID: Gerald [name], male   DOB: Jun 08, 1955, 59 y.o.   MRN: [id number]   Annual Wellness Visit &  Comprehensive Evaluation,  Examination & Management  This very nice 59 y.o. [name] presents for  presents for a Wellness Visit & comprehensive evaluation and management of multiple medical co-morbidities.  Patient has been followed for HTN, Prediabetes, Hyperlipidemia, and Vitamin D Deficiency.   HTN predates since 1999. Patient's BP has been controlled at home.Today's BP: 132/84 mmHg. Patient has CKD 3 (GFR 69 ml/min) attributed to HTN. In 2008 he had a false (+) Cardiolite confirmed by a Negative Heart Cath. Patient does exercise regularly and denies any cardiac symptoms as chest pain, palpitations, shortness of breath, dizziness or ankle swelling.   Patient's hyperlipidemia is controlled with diet and medications. Patient denies myalgias or other medication SE's. Last lipids were  Cholesterol 117*; HDL 43; LDL 49; Triglycerides 126 on 11/02/2014.     Patient has prediabetes since 2011 with A1c 5.7% and patient denies reactive hypoglycemic symptoms, visual blurring, diabetic polys or paresthesias. Last A1c was 5.8% on 11/02/2014.   Finally, patient has history of Vitamin D Deficiency of 48 on supplements in 2008 and last vitamin D was still relatively low at 47 on 11/02/2014.     Medication Sig  . ALPRAZolam  0.5 MG tablet TAKE 1/2 TO 1 TABLET BY MOUTH 3 TIMES DAILY AS NEEDED FOR ANXIETY  . VITAMIN C 1000 MG  Take 1,000 mg by mouth 2 (two) times daily.  . DULCOLAX  5 MG  Take 5 mg by mouth. Dulcolax 5 mg bowel prep #4-Take as directed  . VITAMIN D 5000 UNITS Take 2,000 Units by mouth daily.    Cinnamon 500 mg caps 2 caps = 1,000 mg 2 x day  . CIALIS 5 MG tablet TAKE 1 TABLET DAILY FOR    PROSTATISM  . clobetasol cream (TEMOVATE) 0.05 % APPLY TO RASH TWICE DAILY AS NEEDED  . cyclobenzaprine  10 MG tablet Take 10 mg by mouth as needed for muscle spasms.  Marland Kitchen FLAX SEED OIL  Take 1,000 mg by mouth 3  (three) times daily.   . Glucosamine-Chondroitin   Take 2 tablets daily  . MIDRIN 65-325-100 MG caps Patient not taking: Reported on 08/02/2014  . Magnesium 500 MG TABS Take 2 tablets by mouth daily.  . Melatonin 10 MG CAPS Take by mouth Nightly.  . meloxicam (MOBIC) 15 MG tablet Take 15 mg by mouth as needed for pain.  . moexipril (UNIVASC) 15 MG tablet TAKE 1 TABLET DAILY FOR    BLOOD PRESSURE  . FISH OIL Take 1,000 mg by mouth 3 (three) times daily.   . pregabalin  200 MG capsule Take 200 mg by mouth 2 (two) times daily.  . rosuvastatin  40 MG tablet Take 1 tablet (40 mg total) by mouth daily.  Marland Kitchen TAZORAC 0.05 % cream as needed.   Marland Kitchen FORTESTA 10 MG/ACT (2%) GEL Apply 3 pumps to each thigh daily.  . vitamin B-12  100 MCG tablet Take 100 mcg by mouth daily.  Marland Kitchen zolmitriptan 5 MG tablet as needed.    No Known Allergies   Past Medical History  Diagnosis Date  . Hypertension   . Hyperlipidemia   . Vitamin D deficiency   . Pre-diabetes     no meds  . Hypogonadism, male    Health Maintenance  Topic Date Due  . Hepatitis C Screening  01/09/1956  . HIV Screening  05/11/1971  .  INFLUENZA VACCINE  11/05/2014  . TETANUS/TDAP  06/16/2018  . COLONOSCOPY  08/01/2024   Immunization History  Administered Date(s) Administered  . Influenza Split 01/08/2014  . Influenza Whole 01/05/2013  . Influenza-Unspecified 01/05/2015  . PPD Test 01/08/2014, 01/14/2015  . Tdap 06/15/2008   Past Surgical History  Procedure Laterality Date  . Knee surgery      left  . Esophagogastroduodenoscopy    . Orif acetabular fracture      left arm 2 times   Family History  Problem Relation Age of Onset  . Hypertension Mother   . Hyperlipidemia Mother   . Diabetes Mother   . Hypertension Father    Social History   Social History  . Marital Status: Married    Spouse Name: N/A  . Number of Children: N/A  . Years of Education: N/A   Occupational History  . Not on file.   Social History Main Topics   . Smoking status: Never Smoker   . Smokeless tobacco: Never Used  . Alcohol Use: 1.2 - 3.0 oz/week    2-5 Standard drinks or equivalent per week  . Drug Use: No  . Sexual Activity: Active    ROS Constitutional: Denies fever, chills, weight loss/gain, headaches, insomnia,  night sweats or change in appetite. Does c/o fatigue. Eyes: Denies redness, blurred vision, diplopia, discharge, itchy or watery eyes.  ENT: Denies discharge, congestion, post nasal drip, epistaxis, sore throat, earache, hearing loss, dental pain, Tinnitus, Vertigo, Sinus pain or snoring.  Cardio: Denies chest pain, palpitations, irregular heartbeat, syncope, dyspnea, diaphoresis, orthopnea, PND, claudication or edema Respiratory: denies cough, dyspnea, DOE, pleurisy, hoarseness, laryngitis or wheezing.  Gastrointestinal: Denies dysphagia, heartburn, reflux, water brash, pain, cramps, nausea, vomiting, bloating, diarrhea, constipation, hematemesis, melena, hematochezia, jaundice or hemorrhoids Genitourinary: Denies dysuria, frequency, urgency, nocturia, hesitancy, discharge, hematuria or flank pain Musculoskeletal: Denies arthralgia, myalgia, stiffness, Jt. Swelling, pain, limp or strain/sprain. Denies Falls. Skin: Denies puritis, rash, hives, warts, acne, eczema or change in skin lesion Neuro: No weakness, tremor, incoordination, spasms, paresthesia or pain Psychiatric: Denies confusion, memory loss or sensory loss. Denies Depression. Endocrine: Denies change in weight, skin, hair change, nocturia, and paresthesia, diabetic polys, visual blurring or hyper / hypo glycemic episodes.  Heme/Lymph: No excessive bleeding, bruising or enlarged lymph nodes.  Physical Exam  BP 132/84 mmHg  Pulse 60  Temp(Src) 97.3 F (36.3 C)  Resp 16  Ht 5' 8.75" (1.746 m)  Wt 192 lb 6.4 oz (87.272 kg)  BMI 28.63 kg/m2  General Appearance: Well nourished &  in no apparent distress. Eyes: PERRLA, EOMs, conjunctiva no swelling or  erythema, normal fundi and vessels. Sinuses: No frontal/maxillary tenderness ENT/Mouth: EACs patent / TMs  nl. Nares clear without erythema, swelling, mucoid exudates. Oral hygiene is good. No erythema, swelling, or exudate. Tongue normal, non-obstructing. Tonsils not swollen or erythematous. Hearing normal.  Neck: Supple, thyroid normal. No bruits, nodes or JVD. Respiratory: Respiratory effort normal.  BS equal and clear bilateral without rales, rhonci, wheezing or stridor. Cardio: Heart sounds are normal with regular rate and rhythm and no murmurs, rubs or gallops. Peripheral pulses are normal and equal bilaterally without edema. No aortic or femoral bruits. Chest: symmetric with normal excursions and percussion.  Abdomen: Flat, soft, with bowel sounds. Nontender, no guarding, rebound, hernias, masses, or organomegaly.  Lymphatics: Non tender without lymphadenopathy.  Genitourinary: No hernias.Testes nl. DRE - prostate nl for age - smooth & firm w/o nodules. Musculoskeletal: Full ROM all peripheral extremities, joint stability, 5/5  strength, and normal gait. Skin: Warm and dry without rashes, lesions, cyanosis, clubbing or  ecchymosis.  Neuro: Cranial nerves intact, reflexes equal bilaterally. Normal muscle tone, no cerebellar symptoms. Sensation intact.  Pysch: Alert and oriented X 3 with normal affect, insight and judgment appropriate.   Assessment and Plan  1. Encounter for general adult medical examination with abnormal findings   2. Essential hypertension  - EKG 12-Lead - [place], RETROPERITNL ABD,  LTD - TSH  3. Hyperlipidemia  - Lipid panel  4. Pre-diabetes  - Hemoglobin A1c - Insulin, random  5. Vitamin D deficiency  - Vit D  25 hydroxy   6. Testosterone Deficiency  - Testosterone  7. Migraine    8. BMI 29.16,  adult   9. Screening for rectal cancer  - POC Hemoccult Bld/Stl   10. Prostate cancer screening  - PSA  11. Other fatigue  - Vitamin B12 -  Testosterone - Iron and TIBC - TSH  12. Screening examination for pulmonary tuberculosis  - PPD  13. Medication management  - CBC with Differential/Platelet - BASIC METABOLIC PANEL WITH GFR - Hepatic function panel - Magnesium   Continue prudent diet as discussed, weight control, BP monitoring, regular exercise, and medications as discussed.  Discussed med effects and SE's. Routine screening labs and tests as requested with regular follow-up as recommended.  Over 40 minutes of exam, counseling &  chart review was performed

## 2015-01-14 ENCOUNTER — Ambulatory Visit (INDEPENDENT_AMBULATORY_CARE_PROVIDER_SITE_OTHER): Payer: 59 | Admitting: Internal Medicine

## 2015-01-14 ENCOUNTER — Encounter: Payer: Self-pay | Admitting: Internal Medicine

## 2015-01-14 VITALS — BP 132/84 | HR 60 | Temp 97.3°F | Resp 16 | Ht 68.75 in | Wt 192.4 lb

## 2015-01-14 DIAGNOSIS — E785 Hyperlipidemia, unspecified: Secondary | ICD-10-CM

## 2015-01-14 DIAGNOSIS — R7303 Prediabetes: Secondary | ICD-10-CM

## 2015-01-14 DIAGNOSIS — Z79899 Other long term (current) drug therapy: Secondary | ICD-10-CM

## 2015-01-14 DIAGNOSIS — Z6829 Body mass index (BMI) 29.0-29.9, adult: Secondary | ICD-10-CM

## 2015-01-14 DIAGNOSIS — Z0000 Encounter for general adult medical examination without abnormal findings: Secondary | ICD-10-CM | POA: Diagnosis not present

## 2015-01-14 DIAGNOSIS — Z0001 Encounter for general adult medical examination with abnormal findings: Secondary | ICD-10-CM

## 2015-01-14 DIAGNOSIS — E291 Testicular hypofunction: Secondary | ICD-10-CM

## 2015-01-14 DIAGNOSIS — E559 Vitamin D deficiency, unspecified: Secondary | ICD-10-CM

## 2015-01-14 DIAGNOSIS — G43009 Migraine without aura, not intractable, without status migrainosus: Secondary | ICD-10-CM

## 2015-01-14 DIAGNOSIS — Z1212 Encounter for screening for malignant neoplasm of rectum: Secondary | ICD-10-CM

## 2015-01-14 DIAGNOSIS — Z111 Encounter for screening for respiratory tuberculosis: Secondary | ICD-10-CM | POA: Diagnosis not present

## 2015-01-14 DIAGNOSIS — I10 Essential (primary) hypertension: Secondary | ICD-10-CM

## 2015-01-14 DIAGNOSIS — R5383 Other fatigue: Secondary | ICD-10-CM

## 2015-01-14 DIAGNOSIS — Z125 Encounter for screening for malignant neoplasm of prostate: Secondary | ICD-10-CM

## 2015-01-14 LAB — HEMOGLOBIN A1C
Hgb A1c MFr Bld: 5.7 % — ABNORMAL HIGH (ref <5.7)
Mean Plasma Glucose: 117 mg/dL — ABNORMAL HIGH (ref <117)

## 2015-01-14 LAB — CBC WITH DIFFERENTIAL/PLATELET
Basophils Absolute: 0.1 10*3/uL (ref 0.0–0.1)
Basophils Relative: 2 % — ABNORMAL HIGH (ref 0–1)
Eosinophils Absolute: 0.3 10*3/uL (ref 0.0–0.7)
Eosinophils Relative: 7 % — ABNORMAL HIGH (ref 0–5)
HCT: 48.2 % (ref 39.0–52.0)
Hemoglobin: 15.5 g/dL (ref 13.0–17.0)
Lymphocytes Relative: 33 % (ref 12–46)
Lymphs Abs: 1.3 10*3/uL (ref 0.7–4.0)
MCH: 27.2 pg (ref 26.0–34.0)
MCHC: 32.2 g/dL (ref 30.0–36.0)
MCV: 84.7 fL (ref 78.0–100.0)
MPV: 9.6 fL (ref 8.6–12.4)
Monocytes Absolute: 0.5 10*3/uL (ref 0.1–1.0)
Monocytes Relative: 12 % (ref 3–12)
Neutro Abs: 1.8 10*3/uL (ref 1.7–7.7)
Neutrophils Relative %: 46 % (ref 43–77)
Platelets: 258 10*3/uL (ref 150–400)
RBC: 5.69 MIL/uL (ref 4.22–5.81)
RDW: 15.1 % (ref 11.5–15.5)
WBC: 3.9 10*3/uL — ABNORMAL LOW (ref 4.0–10.5)

## 2015-01-14 NOTE — Patient Instructions (Signed)

## 2015-01-15 LAB — IRON AND TIBC
%SAT: 22 % (ref 15–60)
Iron: 93 ug/dL (ref 50–180)
TIBC: 430 ug/dL — ABNORMAL HIGH (ref 250–425)
UIBC: 337 ug/dL (ref 125–400)

## 2015-01-15 LAB — BASIC METABOLIC PANEL WITH GFR
BUN: 17 mg/dL (ref 7–25)
CO2: 26 mmol/L (ref 20–31)
Calcium: 9.8 mg/dL (ref 8.6–10.3)
Chloride: 104 mmol/L (ref 98–110)
Creat: 0.97 mg/dL (ref 0.70–1.33)
GFR, Est African American: >89 mL/min (ref >=60)
GFR, Est Non African American: 86 mL/min (ref >=60)
Glucose, Bld: 77 mg/dL (ref 65–99)
Potassium: 4.7 mmol/L (ref 3.5–5.3)
Sodium: 141 mmol/L (ref 135–146)

## 2015-01-15 LAB — HEPATIC FUNCTION PANEL
ALT: 21 U/L (ref 9–46)
AST: 24 U/L (ref 10–35)
Albumin: 4.4 g/dL (ref 3.6–5.1)
Alkaline Phosphatase: 68 U/L (ref 40–115)
Bilirubin, Direct: 0.1 mg/dL (ref <=0.2)
Indirect Bilirubin: 0.4 mg/dL (ref 0.2–1.2)
Total Bilirubin: 0.5 mg/dL (ref 0.2–1.2)
Total Protein: 6.3 g/dL (ref 6.1–8.1)

## 2015-01-15 LAB — MAGNESIUM: Magnesium: 2.3 mg/dL (ref 1.5–2.5)

## 2015-01-15 LAB — LIPID PANEL
Cholesterol: 132 mg/dL (ref 125–200)
HDL: 45 mg/dL (ref >=40)
LDL Cholesterol: 72 mg/dL (ref <130)
Total CHOL/HDL Ratio: 2.9 Ratio (ref <=5.0)
Triglycerides: 75 mg/dL (ref <150)
VLDL: 15 mg/dL (ref <30)

## 2015-01-15 LAB — VITAMIN D 25 HYDROXY (VIT D DEFICIENCY, FRACTURES): Vit D, 25-Hydroxy: 44 ng/mL (ref 30–100)

## 2015-01-15 LAB — VITAMIN B12: Vitamin B-12: 858 pg/mL (ref 211–911)

## 2015-01-15 LAB — TESTOSTERONE: Testosterone: 482 ng/dL (ref 300–890)

## 2015-01-15 LAB — TSH: TSH: 0.817 u[IU]/mL (ref 0.350–4.500)

## 2015-01-15 LAB — PSA: PSA: 3.84 ng/mL (ref <=4.00)

## 2015-01-15 LAB — INSULIN, RANDOM: Insulin: 5.7 u[IU]/mL (ref 2.0–19.6)

## 2015-01-21 LAB — TB SKIN TEST
Induration: 0 mm
TB Skin Test: NEGATIVE

## 2015-01-23 ENCOUNTER — Other Ambulatory Visit: Payer: Self-pay | Admitting: *Deleted

## 2015-01-23 DIAGNOSIS — E349 Endocrine disorder, unspecified: Secondary | ICD-10-CM

## 2015-01-23 MED ORDER — TESTOSTERONE 10 MG/ACT (2%) TD GEL: TRANSDERMAL | Status: DC

## 2015-02-04 ENCOUNTER — Other Ambulatory Visit: Payer: Self-pay | Admitting: *Deleted

## 2015-02-04 DIAGNOSIS — Z1212 Encounter for screening for malignant neoplasm of rectum: Secondary | ICD-10-CM

## 2015-02-04 LAB — POC HEMOCCULT BLD/STL (HOME/3-CARD/SCREEN)
Card #2 Fecal Occult Blod, POC: NEGATIVE
Card #3 Fecal Occult Blood, POC: NEGATIVE
Fecal Occult Blood, POC: NEGATIVE

## 2015-02-25 ENCOUNTER — Other Ambulatory Visit: Payer: Self-pay | Admitting: Internal Medicine

## 2015-03-20 ENCOUNTER — Encounter: Payer: Self-pay | Admitting: Physician Assistant

## 2015-03-20 ENCOUNTER — Ambulatory Visit (INDEPENDENT_AMBULATORY_CARE_PROVIDER_SITE_OTHER): Payer: 59 | Admitting: Physician Assistant

## 2015-03-20 VITALS — BP 118/70 | HR 41 | Temp 98.1°F | Resp 16 | Ht 68.75 in | Wt 193.0 lb

## 2015-03-20 DIAGNOSIS — H6591 Unspecified nonsuppurative otitis media, right ear: Secondary | ICD-10-CM | POA: Diagnosis not present

## 2015-03-20 MED ORDER — FLUTICASONE PROPIONATE 50 MCG/ACT NA SUSP: 1.0000 | Freq: Every day | NASAL | Status: DC

## 2015-03-20 NOTE — Patient Instructions (Signed)
Your ears and sinuses are connected by the eustachian tube. When your sinuses are inflamed, this can close off the tube and cause fluid to collect in your middle ear. This can then cause dizziness, popping, clicking, ringing, and echoing in your ears. This is often NOT an infection and does NOT require antibiotics, it is caused by inflammation so the treatments help the inflammation. This can take a long time to get better so please be patient.  Here are things you can do to help with this: - Try the Flonase or Nasonex. Remember to spray each nostril twice towards the outer part of your eye.  Do not sniff but instead pinch your nose and tilt your head back to help the medicine get into your sinuses.  The best time to do this is at bedtime.Stop if you get blurred vision or nose bleeds.  -While drinking fluids, pinch and hold nose close and swallow, to help open eustachian tubes to drain fluid behind ear drums. -Please pick one of the over the counter allergy medications below and take it once daily for allergies.  It will also help with fluid behind ear drums. Claritin or loratadine cheapest but likely the weakest  Zyrtec or certizine at night because it can make you sleepy The strongest is allegra or fexafinadine  Cheapest at [facility], [employer], [employer] -can use decongestant over the counter, please do not use if you have high blood pressure or certain heart conditions.   if worsening HA, changes vision/speech, imbalance, weakness go to the ER   

## 2015-03-20 NOTE — Progress Notes (Signed)
   Subjective:    Patient ID: Gerald [name], male    DOB: [birth year], 59 y.o.   MRN: [id number]  HPI 59 y.o. WM with right ear pain x 2 days. States Sunday he has some chills and felt tired then Monday started to have right ear pain. Denies sinus symptoms, sore throat, headache, dizziness, teeth issues.     Blood pressure 118/70, pulse 41, temperature 98.1 F (36.7 C), temperature source Temporal, resp. rate 16, height 5' 8.75" (1.746 m), weight 193 lb (87.544 kg), SpO2 99 %.  Review of Systems  Constitutional: Negative.  Negative for fever, chills and fatigue.  HENT: Positive for ear pain and rhinorrhea. Negative for congestion, dental problem, drooling, ear discharge, facial swelling, hearing loss, mouth sores, nosebleeds, postnasal drip, sinus pressure, sneezing, sore throat, tinnitus, trouble swallowing and voice change.   Respiratory: Negative.   Cardiovascular: Negative.   Gastrointestinal: Negative.   Neurological: Negative.  Negative for dizziness.       Objective:   Physical Exam  Constitutional: He appears well-developed and well-nourished.  HENT:  Right Ear: Hearing, external ear and ear canal normal. No mastoid tenderness. Tympanic membrane is not perforated and not erythematous. A middle ear effusion is present. No decreased hearing is noted.  Left Ear: Hearing, external ear and ear canal normal. No mastoid tenderness. Tympanic membrane is not perforated and not erythematous.  No middle ear effusion. No decreased hearing is noted.  Nose: Right sinus exhibits no maxillary sinus tenderness and no frontal sinus tenderness. Left sinus exhibits no maxillary sinus tenderness and no frontal sinus tenderness.  Mouth/Throat: Uvula is midline and oropharynx is clear and moist. Normal dentition. No dental abscesses or dental caries. No oropharyngeal exudate, posterior oropharyngeal edema, posterior oropharyngeal erythema or tonsillar abscesses.  Eyes: Conjunctivae are normal. Pupils are  equal, round, and reactive to light.  Neck: Normal range of motion. Neck supple.  Cardiovascular: Normal rate and regular rhythm.   Pulmonary/Chest: Effort normal and breath sounds normal. He has no wheezes.  Lymphadenopathy:    He has no cervical adenopathy.       Assessment & Plan:  Ear effusion, no infection -Allergy pill, flonase, autoinflation, explained no need for ABX at this time.

## 2015-03-21 ENCOUNTER — Ambulatory Visit: Payer: Self-pay | Admitting: Internal Medicine

## 2015-03-27 ENCOUNTER — Other Ambulatory Visit: Payer: Self-pay | Admitting: Internal Medicine

## 2015-04-18 ENCOUNTER — Encounter: Payer: Self-pay | Admitting: Physician Assistant

## 2015-04-18 ENCOUNTER — Ambulatory Visit (INDEPENDENT_AMBULATORY_CARE_PROVIDER_SITE_OTHER): Payer: 59 | Admitting: Physician Assistant

## 2015-04-18 VITALS — BP 118/70 | HR 48 | Temp 97.9°F | Ht 68.75 in | Wt 193.0 lb

## 2015-04-18 DIAGNOSIS — I10 Essential (primary) hypertension: Secondary | ICD-10-CM

## 2015-04-18 DIAGNOSIS — R001 Bradycardia, unspecified: Secondary | ICD-10-CM | POA: Insufficient documentation

## 2015-04-18 DIAGNOSIS — E785 Hyperlipidemia, unspecified: Secondary | ICD-10-CM

## 2015-04-18 DIAGNOSIS — R7303 Prediabetes: Secondary | ICD-10-CM

## 2015-04-18 DIAGNOSIS — E291 Testicular hypofunction: Secondary | ICD-10-CM

## 2015-04-18 DIAGNOSIS — E559 Vitamin D deficiency, unspecified: Secondary | ICD-10-CM

## 2015-04-18 DIAGNOSIS — Z79899 Other long term (current) drug therapy: Secondary | ICD-10-CM

## 2015-04-18 LAB — CBC WITH DIFFERENTIAL/PLATELET
Basophils Absolute: 0 10*3/uL (ref 0.0–0.1)
Basophils Relative: 1 % (ref 0–1)
Eosinophils Absolute: 0.3 10*3/uL (ref 0.0–0.7)
Eosinophils Relative: 7 % — ABNORMAL HIGH (ref 0–5)
HCT: 49.1 % (ref 39.0–52.0)
Hemoglobin: 15.8 g/dL (ref 13.0–17.0)
Lymphocytes Relative: 30 % (ref 12–46)
Lymphs Abs: 1.4 10*3/uL (ref 0.7–4.0)
MCH: 27.9 pg (ref 26.0–34.0)
MCHC: 32.2 g/dL (ref 30.0–36.0)
MCV: 86.6 fL (ref 78.0–100.0)
MPV: 9.4 fL (ref 8.6–12.4)
Monocytes Absolute: 0.5 10*3/uL (ref 0.1–1.0)
Monocytes Relative: 10 % (ref 3–12)
Neutro Abs: 2.4 10*3/uL (ref 1.7–7.7)
Neutrophils Relative %: 52 % (ref 43–77)
Platelets: 273 10*3/uL (ref 150–400)
RBC: 5.67 MIL/uL (ref 4.22–5.81)
RDW: 14.7 % (ref 11.5–15.5)
WBC: 4.6 10*3/uL (ref 4.0–10.5)

## 2015-04-18 LAB — BASIC METABOLIC PANEL WITH GFR
BUN: 18 mg/dL (ref 7–25)
CO2: 27 mmol/L (ref 20–31)
Calcium: 9.8 mg/dL (ref 8.6–10.3)
Chloride: 103 mmol/L (ref 98–110)
Creat: 0.96 mg/dL (ref 0.70–1.33)
GFR, Est African American: >89 mL/min (ref >=60)
GFR, Est Non African American: 87 mL/min (ref >=60)
Glucose, Bld: 84 mg/dL (ref 65–99)
Potassium: 4.7 mmol/L (ref 3.5–5.3)
Sodium: 141 mmol/L (ref 135–146)

## 2015-04-18 LAB — TSH: TSH: 1.386 u[IU]/mL (ref 0.350–4.500)

## 2015-04-18 LAB — HEPATIC FUNCTION PANEL
ALT: 31 U/L (ref 9–46)
AST: 33 U/L (ref 10–35)
Albumin: 4.7 g/dL (ref 3.6–5.1)
Alkaline Phosphatase: 65 U/L (ref 40–115)
Bilirubin, Direct: 0.1 mg/dL (ref <=0.2)
Indirect Bilirubin: 0.4 mg/dL (ref 0.2–1.2)
Total Bilirubin: 0.5 mg/dL (ref 0.2–1.2)
Total Protein: 6.9 g/dL (ref 6.1–8.1)

## 2015-04-18 LAB — LIPID PANEL
Cholesterol: 152 mg/dL (ref 125–200)
HDL: 52 mg/dL (ref >=40)
LDL Cholesterol: 77 mg/dL (ref <130)
Total CHOL/HDL Ratio: 2.9 Ratio (ref <=5.0)
Triglycerides: 114 mg/dL (ref <150)
VLDL: 23 mg/dL (ref <30)

## 2015-04-18 LAB — MAGNESIUM: Magnesium: 2.2 mg/dL (ref 1.5–2.5)

## 2015-04-18 MED ORDER — ROSUVASTATIN CALCIUM 40 MG PO TABS: 40.0000 mg | ORAL_TABLET | Freq: Every day | ORAL | Status: DC

## 2015-04-18 NOTE — Patient Instructions (Signed)
Benefiber is good for constipation/diarrhea/irritable bowel syndrome, it helps with weight loss and can help lower your bad cholesterol. Please do 1-2 TBSP in the morning in water, coffee, or tea. It can take up to a month before you can see a difference with your bowel movements. It is cheapest from [employer], [employer], [employer].   Cholesterol Cholesterol is a white, waxy, fat-like substance needed by your body in small amounts. The liver makes all the cholesterol you need. Cholesterol is carried from the liver by the blood through the blood vessels. Deposits of cholesterol (plaque) may build up on blood vessel walls. These make the arteries narrower and stiffer. Cholesterol plaques increase the risk for heart attack and stroke.  You cannot feel your cholesterol level even if it is very high. The only way to know it is high is with a blood test. Once you know your cholesterol levels, you should keep a record of the test results. Work with your health care provider to keep your levels in the desired range.  WHAT DO THE RESULTS MEAN?  Total cholesterol is a rough measure of all the cholesterol in your blood.   LDL is the so-called bad cholesterol. This is the type that deposits cholesterol in the walls of the arteries. You want this level to be low.   HDL is the good cholesterol because it cleans the arteries and carries the LDL away. You want this level to be high.  Triglycerides are fat that the body can either burn for energy or store. High levels are closely linked to heart disease.  WHAT ARE THE DESIRED LEVELS OF CHOLESTEROL?  Total cholesterol below 200.   LDL below 100 for people at risk, below 70 for those at very high risk.   HDL above 50 is good, above 60 is best.   Triglycerides below 150.  HOW CAN I LOWER MY CHOLESTEROL?  Diet. Follow your diet programs as directed by your health care provider.   Choose fish or white meat chicken and [place], roasted or baked. Limit fatty cuts  of red meat, fried foods, and processed meats, such as sausage and lunch meats.   Eat lots of fresh fruits and vegetables.  Choose whole grains, beans, pasta, potatoes, and cereals.   Use only small amounts of olive, corn, or canola oils.   Avoid butter, mayonnaise, shortening, or palm kernel oils.  Avoid foods with trans fats.   Drink skim or nonfat milk and eat low-fat or nonfat yogurt and cheeses. Avoid whole milk, cream, ice cream, egg yolks, and full-fat cheeses.   Healthy desserts include angel food cake, ginger snaps, animal crackers, hard candy, popsicles, and low-fat or nonfat frozen yogurt. Avoid pastries, cakes, pies, and cookies.   Exercise. Follow your exercise programs as directed by your health care provider.   A regular program helps decrease LDL and raise HDL.   A regular program helps with weight control.   Do things that increase your activity level like gardening, walking, or taking the stairs. Ask your health care provider about how you can be more active in your daily life.   Medicine. Take medicine only as directed by your health care provider.   Medicine may be prescribed by your health care provider to help lower cholesterol and decrease the risk for heart disease.   If you have several risk factors, you may need medicine even if your levels are normal.   This information is not intended to replace advice given to you by your  health care provider. Make sure you discuss any questions you have with your health care provider.   Document Released: 12/16/2000 Document Revised: 04/13/2014 Document Reviewed: 01/04/2013 Elsevier Interactive Patient Education [employer].

## 2015-04-18 NOTE — Progress Notes (Signed)
Assessment and Plan:  Hypertension: Continue medication, monitor blood pressure at home. Continue DASH diet.  Reminder to go to the ER if any CP, SOB, nausea, dizziness, severe HA, changes vision/speech, left arm numbness and tingling, and jaw pain. Cholesterol: Continue diet and exercise. Check cholesterol.  Pre-diabetes-Continue diet and exercise. Check A1C Vitamin D Def- check level and continue medications. Hypogonadism- continue replacement therapy, check testosterone levels as needed.   Bradycardia- monitor for symptoms.    Continue diet and meds as discussed. Further disposition pending results of labs.  HPI 60 y.o. male  presents for 3 month follow up with hypertension, hyperlipidemia, prediabetes and vitamin D. His blood pressure has been controlled at home, today their BP is BP: 118/70 mmHg He does workout, runs two in a half miles 5 days a week and [facility], denies chest pain, shortness of breath, dizziness.  He is on crestor 40 1/2 pill QD and denies myalgias. His cholesterol is at goal. The cholesterol last visit was:   Lab Results  Component Value Date   CHOL 132 01/14/2015   HDL 45 01/14/2015   LDLCALC 72 01/14/2015   TRIG 75 01/14/2015   CHOLHDL 2.9 01/14/2015  He has been working on diet and exercise for prediabetes, and denies paresthesia of the feet, polydipsia, polyuria and visual disturbances. Last A1C in the office was:  Lab Results  Component Value Date   HGBA1C 5.7* 01/14/2015  Patient is on Vitamin D supplement.   Lab Results  Component Value Date   VD25OH 44 01/14/2015  He has a history of testosterone deficiency and is on testosterone replacement, on fortesta 3 pumps a day. He states that the testosterone helps with his energy, libido, muscle mass. Lab Results  Component Value Date   TESTOSTERONE 482 01/14/2015     Current Medications:  Current Outpatient Prescriptions on File Prior to Visit  Medication Sig Dispense Refill  . ALPRAZolam (XANAX)  0.5 MG tablet TAKE 1/2 TO 1 TABLET BY MOUTH 3 TIMES DAILY AS NEEDED FOR ANXIETY 90 tablet 0  . Ascorbic Acid (VITAMIN C) 1000 MG tablet Take 1,000 mg by mouth 2 (two) times daily.    . Cholecalciferol (VITAMIN D-3) 5000 UNITS TABS Take 2,000 Units by mouth daily.     Marland Kitchen CIALIS 5 MG tablet TAKE 1 TABLET DAILY FOR    PROSTATISM 90 tablet 1  . clobetasol cream (TEMOVATE) 0.05 % APPLY TO RASH TWICE DAILY AS NEEDED 60 g 99  . cyclobenzaprine (FLEXERIL) 10 MG tablet Take 10 mg by mouth as needed for muscle spasms.    . Flaxseed, Linseed, (FLAX SEED OIL PO) Take 1,000 mg by mouth 3 (three) times daily.     . fluticasone (FLONASE) 50 MCG/ACT nasal spray Place 1 spray into both nostrils daily. 16 g 2  . Glucosamine-Chondroitin (GLUCOSAMINE CHONDR COMPLEX PO) Take by mouth. Take 2 tablets daily    . isometheptene-acetaminophen-dichloralphenazone (MIDRIN) 65-100-325 MG capsule TAKE 1-2 CAPSULE BY MOUTH EVERY 4 HR AS NEEDED FOR HEADACHE 100 capsule 0  . Magnesium 500 MG TABS Take 2 tablets by mouth daily.    . Melatonin 10 MG CAPS Take by mouth Nightly.    . meloxicam (MOBIC) 15 MG tablet Take 15 mg by mouth as needed for pain.    . moexipril (UNIVASC) 15 MG tablet TAKE 1 TABLET DAILY FOR    BLOOD PRESSURE 90 tablet 3  . Omega-3 Fatty Acids (FISH OIL PO) Take 1,000 mg by mouth 3 (three) times daily.     Marland Kitchen  pregabalin (LYRICA) 200 MG capsule Take 200 mg by mouth 2 (two) times daily.    . Probiotic Product (ALIGN) 4 MG CAPS TAKE ONE PILL BY MOUTH ONCE DAILY  99  . rosuvastatin (CRESTOR) 40 MG tablet Take 1 tablet (40 mg total) by mouth daily. 90 tablet 0  . TAZORAC 0.05 % cream as needed.     . Testosterone (FORTESTA) 10 MG/ACT (2%) GEL Apply 3 pumps to each thigh daily. 180 g 5  . vitamin B-12 (CYANOCOBALAMIN) 100 MCG tablet Take 100 mcg by mouth daily.    Marland Kitchen zolmitriptan (ZOMIG) 5 MG tablet as needed.   0   No current facility-administered medications on file prior to visit.   Medical History:  Past  Medical History  Diagnosis Date  . Hypertension   . Hyperlipidemia   . Vitamin D deficiency   . Pre-diabetes     no meds  . Hypogonadism, male    Allergies: No Known Allergies   Review of Systems:  Review of Systems  Constitutional: Negative.  Negative for malaise/fatigue.  HENT: Negative.   Eyes: Negative.   Respiratory: Negative for shortness of breath.   Cardiovascular: Negative.  Negative for chest pain.  Gastrointestinal: Negative.   Genitourinary: Negative.   Musculoskeletal: Negative.   Skin: Negative.   Neurological: Negative.   Endo/Heme/Allergies: Negative.   Psychiatric/Behavioral: Negative.     Family history- Review and unchanged Social history- Review and unchanged Physical Exam: BP 118/70 mmHg  Pulse 48  Temp(Src) 97.9 F (36.6 C) (Temporal)  Ht 5' 8.75" (1.746 m)  Wt 193 lb (87.544 kg)  BMI 28.72 kg/m2  SpO2 98% Wt Readings from Last 3 Encounters:  04/18/15 193 lb (87.544 kg)  03/20/15 193 lb (87.544 kg)  01/14/15 192 lb 6.4 oz (87.272 kg)   General Appearance: Well nourished, in no apparent distress. Eyes: PERRLA, EOMs, conjunctiva no swelling or erythema Sinuses: No Frontal/maxillary tenderness ENT/Mouth: Ext aud canals clear, TMs without erythema, bulging. No erythema, swelling, or exudate on post pharynx.  Tonsils not swollen or erythematous. Hearing normal.  Neck: Supple, thyroid normal.  Respiratory: Respiratory effort normal, BS equal bilaterally without rales, rhonchi, wheezing or stridor.  Cardio: sinus brady with no MRGs. Brisk peripheral pulses without edema.  Abdomen: Soft, + BS.  Non tender, no guarding, rebound, hernias, masses. Lymphatics: Non tender without lymphadenopathy.  Musculoskeletal: Full ROM, 5/5 strength, normal gait.  Skin: Warm, dry without rashes, lesions, ecchymosis.  Neuro: Cranial nerves intact. Normal muscle tone, no cerebellar symptoms. Sensation intact.  Psych: Awake and oriented X 3, normal affect, Insight and  Judgment appropriate.    Vicie Mutters, PA-C 10:40 AM [facility]

## 2015-04-19 LAB — HEMOGLOBIN A1C
Hgb A1c MFr Bld: 5.8 % — ABNORMAL HIGH (ref <5.7)
Mean Plasma Glucose: 120 mg/dL — ABNORMAL HIGH (ref <117)

## 2015-04-19 LAB — VITAMIN D 25 HYDROXY (VIT D DEFICIENCY, FRACTURES): Vit D, 25-Hydroxy: 52 ng/mL (ref 30–100)

## 2015-05-20 ENCOUNTER — Other Ambulatory Visit: Payer: Self-pay | Admitting: *Deleted

## 2015-05-20 DIAGNOSIS — E785 Hyperlipidemia, unspecified: Secondary | ICD-10-CM

## 2015-05-20 MED ORDER — ROSUVASTATIN CALCIUM 40 MG PO TABS: 40.0000 mg | ORAL_TABLET | Freq: Every day | ORAL | Status: DC

## 2015-06-09 ENCOUNTER — Other Ambulatory Visit: Payer: Self-pay | Admitting: Internal Medicine

## 2015-07-06 ENCOUNTER — Other Ambulatory Visit: Payer: Self-pay | Admitting: Physician Assistant

## 2015-07-22 ENCOUNTER — Ambulatory Visit: Payer: Self-pay | Admitting: Internal Medicine

## 2015-07-25 ENCOUNTER — Ambulatory Visit (INDEPENDENT_AMBULATORY_CARE_PROVIDER_SITE_OTHER): Payer: 59 | Admitting: Physician Assistant

## 2015-07-25 ENCOUNTER — Ambulatory Visit: Payer: Self-pay | Admitting: Internal Medicine

## 2015-07-25 ENCOUNTER — Other Ambulatory Visit: Payer: Self-pay

## 2015-07-25 ENCOUNTER — Encounter: Payer: Self-pay | Admitting: Physician Assistant

## 2015-07-25 VITALS — BP 122/68 | HR 64 | Temp 97.3°F | Resp 16 | Ht 68.75 in | Wt 193.8 lb

## 2015-07-25 DIAGNOSIS — I10 Essential (primary) hypertension: Secondary | ICD-10-CM

## 2015-07-25 DIAGNOSIS — E291 Testicular hypofunction: Secondary | ICD-10-CM

## 2015-07-25 DIAGNOSIS — Z79899 Other long term (current) drug therapy: Secondary | ICD-10-CM

## 2015-07-25 DIAGNOSIS — R001 Bradycardia, unspecified: Secondary | ICD-10-CM

## 2015-07-25 DIAGNOSIS — E559 Vitamin D deficiency, unspecified: Secondary | ICD-10-CM

## 2015-07-25 DIAGNOSIS — R7303 Prediabetes: Secondary | ICD-10-CM

## 2015-07-25 DIAGNOSIS — E349 Endocrine disorder, unspecified: Secondary | ICD-10-CM

## 2015-07-25 DIAGNOSIS — E785 Hyperlipidemia, unspecified: Secondary | ICD-10-CM | POA: Diagnosis not present

## 2015-07-25 LAB — CBC WITH DIFFERENTIAL/PLATELET
Basophils Absolute: 48 cells/uL (ref 0–200)
Basophils Relative: 1 %
Eosinophils Absolute: 192 cells/uL (ref 15–500)
Eosinophils Relative: 4 %
HCT: 42.3 % (ref 38.5–50.0)
Hemoglobin: 13.9 g/dL (ref 13.2–17.1)
Lymphocytes Relative: 31 %
Lymphs Abs: 1488 cells/uL (ref 850–3900)
MCH: 28.5 pg (ref 27.0–33.0)
MCHC: 32.9 g/dL (ref 32.0–36.0)
MCV: 86.9 fL (ref 80.0–100.0)
MPV: 8.9 fL (ref 7.5–12.5)
Monocytes Absolute: 432 cells/uL (ref 200–950)
Monocytes Relative: 9 %
Neutro Abs: 2640 cells/uL (ref 1500–7800)
Neutrophils Relative %: 55 %
Platelets: 278 10*3/uL (ref 140–400)
RBC: 4.87 MIL/uL (ref 4.20–5.80)
RDW: 14.1 % (ref 11.0–15.0)
WBC: 4.8 10*3/uL (ref 3.8–10.8)

## 2015-07-25 MED ORDER — TESTOSTERONE 10 MG/ACT (2%) TD GEL: TRANSDERMAL | Status: DC

## 2015-07-25 NOTE — Progress Notes (Signed)
Assessment and Plan:  Hypertension: Continue medication, monitor blood pressure at home. Continue DASH diet.  Reminder to go to the ER if any CP, SOB, nausea, dizziness, severe HA, changes vision/speech, left arm numbness and tingling, and jaw pain. Cholesterol: Continue diet and exercise. Check cholesterol.  Pre-diabetes-Continue diet and exercise. Check A1C Vitamin D Def- check level and continue medications. Hypogonadism- continue replacement therapy, check testosterone levels as needed.   Bradycardia- monitor for symptoms.   Continue diet and meds as discussed. Further disposition pending results of labs. Future Appointments Date Time Provider [place]  02/10/2016 10:00 AM Unk [name], MD [facility] None    HPI 60 y.o. male  presents for 3 month follow up with hypertension, hyperlipidemia, prediabetes and vitamin D. His blood pressure has been controlled at home, today their BP is BP: 122/68 mmHg He does workout, runs two in a half miles 5 days a week and goes to [facility], denies chest pain, shortness of breath, dizziness.  He is on crestor 40 1/2 pill QD and denies myalgias. His cholesterol is at goal. The cholesterol last visit was:   Lab Results  Component Value Date   CHOL 152 04/18/2015   HDL 52 04/18/2015   LDLCALC 77 04/18/2015   TRIG 114 04/18/2015   CHOLHDL 2.9 04/18/2015  He has been working on diet and exercise for prediabetes, and denies paresthesia of the feet, polydipsia, polyuria and visual disturbances. Last A1C in the office was:  Lab Results  Component Value Date   HGBA1C 5.8* 04/18/2015  Patient is on Vitamin D supplement.   Lab Results  Component Value Date   VD25OH 60 04/18/2015  He has a history of testosterone deficiency and is on testosterone replacement, on fortesta 4 pumps a day. He states that the testosterone helps with his energy, libido, muscle mass. Lab Results  Component Value Date   TESTOSTERONE 482 01/14/2015     Current  Medications:  Current Outpatient Prescriptions on File Prior to Visit  Medication Sig Dispense Refill  . ALPRAZolam (XANAX) 0.5 MG tablet TAKE 1/2 TO 1 TABLET BY MOUTH 3 TIMES DAILY AS NEEDED FOR ANXIETY 90 tablet 0  . Ascorbic Acid (VITAMIN C) 1000 MG tablet Take 1,000 mg by mouth 2 (two) times daily.    . Cholecalciferol (VITAMIN D-3) 5000 UNITS TABS Take 2,000 Units by mouth daily.     Marland Kitchen CIALIS 5 MG tablet TAKE 1 TABLET DAILY FOR    PROSTATISM 90 tablet 1  . clobetasol cream (TEMOVATE) 0.05 % APPLY TO RASH TWICE DAILY AS NEEDED 60 g 99  . cyclobenzaprine (FLEXERIL) 10 MG tablet Take 10 mg by mouth as needed for muscle spasms.    . Flaxseed, Linseed, (FLAX SEED OIL PO) Take 1,000 mg by mouth 3 (three) times daily.     . fluticasone (FLONASE) 50 MCG/ACT nasal spray Place 1 spray into both nostrils daily. 16 g 2  . Glucosamine-Chondroitin (GLUCOSAMINE CHONDR COMPLEX PO) Take by mouth. Take 2 tablets daily    . isometheptene-acetaminophen-dichloralphenazone (MIDRIN) 65-100-325 MG capsule TAKE 1-2 CAPSULE BY MOUTH EVERY 4 HR AS NEEDED FOR HEADACHE 100 capsule 0  . Magnesium 500 MG TABS Take 2 tablets by mouth daily.    . Melatonin 10 MG CAPS Take by mouth Nightly.    . meloxicam (MOBIC) 15 MG tablet Take 15 mg by mouth as needed for pain.    . moexipril (UNIVASC) 15 MG tablet TAKE 1 TABLET DAILY FOR    BLOOD PRESSURE 90 tablet 1  .  Omega-3 Fatty Acids (FISH OIL PO) Take 1,000 mg by mouth 3 (three) times daily.     . pregabalin (LYRICA) 200 MG capsule Take 200 mg by mouth 2 (two) times daily.    . Probiotic Product (ALIGN) 4 MG CAPS TAKE ONE PILL BY MOUTH ONCE DAILY  99  . rosuvastatin (CRESTOR) 40 MG tablet TAKE 1 TABLET DAILY 90 tablet 1  . TAZORAC 0.05 % cream as needed.     . Testosterone (FORTESTA) 10 MG/ACT (2%) GEL Apply 3 pumps to each thigh daily. 180 g 5  . vitamin B-12 (CYANOCOBALAMIN) 100 MCG tablet Take 100 mcg by mouth daily.    Marland Kitchen zolmitriptan (ZOMIG) 5 MG tablet as needed.   0    No current facility-administered medications on file prior to visit.   Medical History:  Past Medical History  Diagnosis Date  . Hypertension   . Hyperlipidemia   . Vitamin D deficiency   . Pre-diabetes     no meds  . Hypogonadism, male    Allergies: No Known Allergies   Review of Systems:  Review of Systems  Constitutional: Negative.  Negative for malaise/fatigue.  HENT: Negative.   Eyes: Negative.   Respiratory: Negative for shortness of breath.   Cardiovascular: Negative.  Negative for chest pain.  Gastrointestinal: Negative.   Genitourinary: Negative.   Musculoskeletal: Negative.   Skin: Negative.   Neurological: Negative.   Endo/Heme/Allergies: Negative.   Psychiatric/Behavioral: Negative.     Family history- Review and unchanged Social history- Review and unchanged Physical Exam: BP 122/68 mmHg  Pulse 64  Temp(Src) 97.3 F (36.3 C) (Temporal)  Resp 16  Ht 5' 8.75" (1.746 m)  Wt 193 lb 12.8 oz (87.907 kg)  BMI 28.84 kg/m2  SpO2 98% Wt Readings from Last 3 Encounters:  07/25/15 193 lb 12.8 oz (87.907 kg)  04/18/15 193 lb (87.544 kg)  03/20/15 193 lb (87.544 kg)   General Appearance: Well nourished, in no apparent distress. Eyes: PERRLA, EOMs, conjunctiva no swelling or erythema Sinuses: No Frontal/maxillary tenderness ENT/Mouth: Ext aud canals clear, TMs without erythema, bulging. No erythema, swelling, or exudate on post pharynx.  Tonsils not swollen or erythematous. Hearing normal.  Neck: Supple, thyroid normal.  Respiratory: Respiratory effort normal, BS equal bilaterally without rales, rhonchi, wheezing or stridor.  Cardio: sinus brady with no MRGs. Brisk peripheral pulses without edema.  Abdomen: Soft, + BS.  Non tender, no guarding, rebound, hernias, masses. Lymphatics: Non tender without lymphadenopathy.  Musculoskeletal: Full ROM, 5/5 strength, normal gait.  Skin: Warm, dry without rashes, lesions, ecchymosis.  Neuro: Cranial nerves intact.  Normal muscle tone, no cerebellar symptoms. Sensation intact.  Psych: Awake and oriented X 3, normal affect, Insight and Judgment appropriate.    Vicie Mutters, PA-C 3:56 PM [facility]

## 2015-07-25 NOTE — Patient Instructions (Signed)

## 2015-07-26 LAB — LIPID PANEL
Cholesterol: 132 mg/dL (ref 125–200)
HDL: 55 mg/dL (ref >=40)
LDL Cholesterol: 56 mg/dL (ref <130)
Total CHOL/HDL Ratio: 2.4 Ratio (ref <=5.0)
Triglycerides: 105 mg/dL (ref <150)
VLDL: 21 mg/dL (ref <30)

## 2015-07-26 LAB — HEPATIC FUNCTION PANEL
ALT: 28 U/L (ref 9–46)
AST: 34 U/L (ref 10–35)
Albumin: 4.4 g/dL (ref 3.6–5.1)
Alkaline Phosphatase: 58 U/L (ref 40–115)
Bilirubin, Direct: 0.1 mg/dL (ref <=0.2)
Indirect Bilirubin: 0.4 mg/dL (ref 0.2–1.2)
Total Bilirubin: 0.5 mg/dL (ref 0.2–1.2)
Total Protein: 6.6 g/dL (ref 6.1–8.1)

## 2015-07-26 LAB — TESTOSTERONE: Testosterone: 603 ng/dL (ref 250–827)

## 2015-07-26 LAB — BASIC METABOLIC PANEL WITH GFR
BUN: 16 mg/dL (ref 7–25)
CO2: 25 mmol/L (ref 20–31)
Calcium: 9.1 mg/dL (ref 8.6–10.3)
Chloride: 103 mmol/L (ref 98–110)
Creat: 1.11 mg/dL (ref 0.70–1.33)
GFR, Est African American: 84 mL/min (ref >=60)
GFR, Est Non African American: 72 mL/min (ref >=60)
Glucose, Bld: 90 mg/dL (ref 65–99)
Potassium: 4.2 mmol/L (ref 3.5–5.3)
Sodium: 139 mmol/L (ref 135–146)

## 2015-07-26 LAB — HEMOGLOBIN A1C
Hgb A1c MFr Bld: 5.8 % — ABNORMAL HIGH (ref <5.7)
Mean Plasma Glucose: 120 mg/dL

## 2015-07-26 LAB — VITAMIN D 25 HYDROXY (VIT D DEFICIENCY, FRACTURES): Vit D, 25-Hydroxy: 50 ng/mL (ref 30–100)

## 2015-07-26 LAB — MAGNESIUM: Magnesium: 2.1 mg/dL (ref 1.5–2.5)

## 2015-07-26 LAB — TSH: TSH: 0.87 mIU/L (ref 0.40–4.50)

## 2015-08-27 ENCOUNTER — Other Ambulatory Visit: Payer: Self-pay | Admitting: Internal Medicine

## 2015-09-22 ENCOUNTER — Other Ambulatory Visit: Payer: Self-pay | Admitting: Internal Medicine

## 2015-09-22 DIAGNOSIS — K589 Irritable bowel syndrome without diarrhea: Secondary | ICD-10-CM

## 2015-09-22 MED ORDER — ALIGN 4 MG PO CAPS: ORAL_CAPSULE | ORAL | Status: AC

## 2015-10-09 ENCOUNTER — Other Ambulatory Visit: Payer: Self-pay | Admitting: *Deleted

## 2015-10-09 MED ORDER — ISOMETHEPTENE-DICHLORAL-APAP 65-100-325 MG PO CAPS: ORAL_CAPSULE | ORAL | Status: DC

## 2015-10-30 ENCOUNTER — Encounter: Payer: Self-pay | Admitting: Physician Assistant

## 2015-10-30 ENCOUNTER — Ambulatory Visit (INDEPENDENT_AMBULATORY_CARE_PROVIDER_SITE_OTHER): Payer: 59 | Admitting: Physician Assistant

## 2015-10-30 VITALS — BP 126/70 | HR 55 | Temp 97.7°F | Resp 16 | Ht 68.75 in | Wt 195.6 lb

## 2015-10-30 DIAGNOSIS — R001 Bradycardia, unspecified: Secondary | ICD-10-CM

## 2015-10-30 DIAGNOSIS — R7303 Prediabetes: Secondary | ICD-10-CM

## 2015-10-30 DIAGNOSIS — E785 Hyperlipidemia, unspecified: Secondary | ICD-10-CM | POA: Diagnosis not present

## 2015-10-30 DIAGNOSIS — I10 Essential (primary) hypertension: Secondary | ICD-10-CM

## 2015-10-30 DIAGNOSIS — E291 Testicular hypofunction: Secondary | ICD-10-CM | POA: Diagnosis not present

## 2015-10-30 DIAGNOSIS — E559 Vitamin D deficiency, unspecified: Secondary | ICD-10-CM

## 2015-10-30 DIAGNOSIS — Z79899 Other long term (current) drug therapy: Secondary | ICD-10-CM | POA: Diagnosis not present

## 2015-10-30 NOTE — Progress Notes (Signed)
Assessment and Plan:  Gerald [name] was seen today for follow-up and hypertension.  Diagnoses and all orders for this visit:  Essential hypertension -     Cancel: CBC with Differential/Platelet -     Cancel: Hepatic function panel -     Cancel: BASIC METABOLIC PANEL WITH GFR -     Cancel: TSH  Hyperlipidemia -     Cancel: Lipid panel  Pre-diabetes -     Cancel: Hemoglobin A1c  Vitamin D deficiency -     Cancel: VITAMIN D 25 Hydroxy (Vit-D Deficiency, Fractures)  Testosterone Deficiency  Medication management -     Cancel: Magnesium  Sinus bradycardia   WILL GET LABS AT CPE IN 2 MONTH WITH DR.MCKEOWN GETTING CPE EARLY DUE TO WORK, PATIENT STATES IT IS COVERED Continue diet and meds as discussed. Further disposition pending results of labs. Future Appointments Date Time Provider [place]  12/12/2015 9:00 AM Unk [name], MD [facility] None  02/10/2016 10:00 AM Unk [name], MD [facility] None    HPI 60 y.o. male  presents for 3 month follow up with hypertension, hyperlipidemia, prediabetes and vitamin D. His blood pressure has been controlled at home, today their BP is BP: 126/70 He does workout, runs two in a half miles 5 days a week and goes to [facility], denies chest pain, shortness of breath, dizziness.  He is on crestor 40 1/2 pill QD and denies myalgias. His cholesterol is at goal. The cholesterol last visit was:   Lab Results  Component Value Date   CHOL 132 07/25/2015   HDL 55 07/25/2015   LDLCALC 56 07/25/2015   TRIG 105 07/25/2015   CHOLHDL 2.4 07/25/2015  He has been working on diet and exercise for prediabetes, and denies paresthesia of the feet, polydipsia, polyuria and visual disturbances. Last A1C in the office was:  Lab Results  Component Value Date   HGBA1C 5.8 (H) 07/25/2015  Patient is on Vitamin D supplement.   Lab Results  Component Value Date   VD25OH 60 07/25/2015  He has a history of testosterone deficiency and is on testosterone  replacement, on fortesta 4 pumps a day and feels better. He states that the testosterone helps with his energy, libido, muscle mass. Lab Results  Component Value Date   TESTOSTERONE 603 07/25/2015     Current Medications:  Current Outpatient Prescriptions on File Prior to Visit  Medication Sig Dispense Refill  . ALPRAZolam (XANAX) 0.5 MG tablet TAKE 1/2 TO 1 TABLET BY MOUTH 3 TIMES DAILY AS NEEDED FOR ANXIETY 90 tablet 0  . Ascorbic Acid (VITAMIN C) 1000 MG tablet Take 1,000 mg by mouth 2 (two) times daily.    . Cholecalciferol (VITAMIN D-3) 5000 UNITS TABS Take 2,000 Units by mouth daily.     Marland Kitchen CIALIS 5 MG tablet TAKE 1 TABLET DAILY FOR    PROSTATISM 90 tablet 1  . clobetasol cream (TEMOVATE) 0.05 % APPLY TO RASH TWICE DAILY AS NEEDED 60 g 99  . cyclobenzaprine (FLEXERIL) 10 MG tablet Take 10 mg by mouth as needed for muscle spasms.    . Flaxseed, Linseed, (FLAX SEED OIL PO) Take 1,000 mg by mouth 3 (three) times daily.     . Glucosamine-Chondroitin (GLUCOSAMINE CHONDR COMPLEX PO) Take by mouth. Take 2 tablets daily    . isometheptene-acetaminophen-dichloralphenazone (MIDRIN) 65-100-325 MG capsule TAKE 1-2 CAPSULE BY MOUTH EVERY 4 HR AS NEEDED FOR HEADACHE 100 capsule 0  . Magnesium 500 MG TABS Take 2 tablets by mouth daily.    Marland Kitchen  Melatonin 10 MG CAPS Take by mouth Nightly.    . meloxicam (MOBIC) 15 MG tablet Take 15 mg by mouth as needed for pain.    . moexipril (UNIVASC) 15 MG tablet TAKE 1 TABLET DAILY FOR    BLOOD PRESSURE 90 tablet 1  . Omega-3 Fatty Acids (FISH OIL PO) Take 1,000 mg by mouth 3 (three) times daily.     Debara Pickett 11 MG/NOSEPC EXHP UAD NEED SIG FROM MD  1  . pregabalin (LYRICA) 200 MG capsule Take 200 mg by mouth 2 (two) times daily.    . Probiotic Product (ALIGN) 4 MG CAPS Take 1 capsule daily for Probiotic Benefit 90 capsule 1  . rosuvastatin (CRESTOR) 40 MG tablet TAKE 1 TABLET DAILY 90 tablet 1  . TAZORAC 0.05 % cream as needed.     . Testosterone (FORTESTA)  10 MG/ACT (2%) GEL Apply 4 pumps total daily to thighs daily. 180 g 5  . vitamin B-12 (CYANOCOBALAMIN) 100 MCG tablet Take 100 mcg by mouth daily.    Marland Kitchen zolmitriptan (ZOMIG) 5 MG tablet as needed.   0   No current facility-administered medications on file prior to visit.    Medical History:  Past Medical History:  Diagnosis Date  . Hyperlipidemia   . Hypertension   . Hypogonadism, male   . Pre-diabetes    no meds  . Vitamin D deficiency    Allergies: No Known Allergies   Review of Systems:  Review of Systems  Constitutional: Negative.  Negative for malaise/fatigue.  HENT: Negative.   Eyes: Negative.   Respiratory: Negative for shortness of breath.   Cardiovascular: Negative.  Negative for chest pain.  Gastrointestinal: Negative.   Genitourinary: Negative.   Musculoskeletal: Negative.   Skin: Negative.   Neurological: Negative.   Endo/Heme/Allergies: Negative.   Psychiatric/Behavioral: Negative.     Family history- Review and unchanged Social history- Review and unchanged Physical Exam: BP 126/70   Pulse (!) 55   Temp 97.7 F (36.5 C) (Temporal)   Resp 16   Ht 5' 8.75" (1.746 m)   Wt 195 lb 9.6 oz (88.7 kg)   SpO2 98%   BMI 29.10 kg/m  Wt Readings from Last 3 Encounters:  10/30/15 195 lb 9.6 oz (88.7 kg)  07/25/15 193 lb 12.8 oz (87.9 kg)  04/18/15 193 lb (87.5 kg)   General Appearance: Well nourished, in no apparent distress. Eyes: PERRLA, EOMs, conjunctiva no swelling or erythema Sinuses: No Frontal/maxillary tenderness ENT/Mouth: Ext aud canals clear, TMs without erythema, bulging. No erythema, swelling, or exudate on post pharynx.  Tonsils not swollen or erythematous. Hearing normal.  Neck: Supple, thyroid normal.  Respiratory: Respiratory effort normal, BS equal bilaterally without rales, rhonchi, wheezing or stridor.  Cardio: sinus brady with no MRGs. Brisk peripheral pulses without edema.  Abdomen: Soft, + BS.  Non tender, no guarding, rebound, hernias,  masses. Lymphatics: Non tender without lymphadenopathy.  Musculoskeletal: Full ROM, 5/5 strength, normal gait.  Skin: Warm, dry without rashes, lesions, ecchymosis.  Neuro: Cranial nerves intact. Normal muscle tone, no cerebellar symptoms. Sensation intact.  Psych: Awake and oriented X 3, normal affect, Insight and Judgment appropriate.    Vicie Mutters, PA-C 3:38 PM [facility]

## 2015-11-04 ENCOUNTER — Other Ambulatory Visit: Payer: Self-pay | Admitting: *Deleted

## 2015-11-04 DIAGNOSIS — E349 Endocrine disorder, unspecified: Secondary | ICD-10-CM

## 2015-11-04 MED ORDER — TESTOSTERONE 10 MG/ACT (2%) TD GEL: TRANSDERMAL | 1 refills | Status: DC

## 2015-11-11 ENCOUNTER — Other Ambulatory Visit: Payer: Self-pay | Admitting: Physician Assistant

## 2015-11-26 ENCOUNTER — Telehealth: Payer: Self-pay

## 2015-11-26 NOTE — Telephone Encounter (Signed)
Spoke with pt & informed pt that he could per Dr. Melford Aase use a web site: [email or web address] to buy his Cialis due to [facility] not having a Generic form of Cialis. Pt agreed with the information & states he will look into this.

## 2015-12-06 ENCOUNTER — Other Ambulatory Visit: Payer: Self-pay | Admitting: Internal Medicine

## 2015-12-12 ENCOUNTER — Ambulatory Visit (INDEPENDENT_AMBULATORY_CARE_PROVIDER_SITE_OTHER): Payer: 59 | Admitting: Internal Medicine

## 2015-12-12 ENCOUNTER — Encounter: Payer: Self-pay | Admitting: Internal Medicine

## 2015-12-12 VITALS — BP 132/82 | HR 72 | Temp 97.3°F | Resp 16 | Ht 68.25 in | Wt 193.4 lb

## 2015-12-12 DIAGNOSIS — Z136 Encounter for screening for cardiovascular disorders: Secondary | ICD-10-CM

## 2015-12-12 DIAGNOSIS — Z0000 Encounter for general adult medical examination without abnormal findings: Secondary | ICD-10-CM

## 2015-12-12 DIAGNOSIS — R5383 Other fatigue: Secondary | ICD-10-CM

## 2015-12-12 DIAGNOSIS — Z0001 Encounter for general adult medical examination with abnormal findings: Secondary | ICD-10-CM

## 2015-12-12 DIAGNOSIS — E291 Testicular hypofunction: Secondary | ICD-10-CM

## 2015-12-12 DIAGNOSIS — I10 Essential (primary) hypertension: Secondary | ICD-10-CM

## 2015-12-12 DIAGNOSIS — E349 Endocrine disorder, unspecified: Secondary | ICD-10-CM

## 2015-12-12 DIAGNOSIS — E785 Hyperlipidemia, unspecified: Secondary | ICD-10-CM

## 2015-12-12 DIAGNOSIS — Z111 Encounter for screening for respiratory tuberculosis: Secondary | ICD-10-CM | POA: Diagnosis not present

## 2015-12-12 DIAGNOSIS — R7303 Prediabetes: Secondary | ICD-10-CM

## 2015-12-12 DIAGNOSIS — G43009 Migraine without aura, not intractable, without status migrainosus: Secondary | ICD-10-CM

## 2015-12-12 DIAGNOSIS — Z125 Encounter for screening for malignant neoplasm of prostate: Secondary | ICD-10-CM

## 2015-12-12 DIAGNOSIS — Z1212 Encounter for screening for malignant neoplasm of rectum: Secondary | ICD-10-CM

## 2015-12-12 DIAGNOSIS — E559 Vitamin D deficiency, unspecified: Secondary | ICD-10-CM

## 2015-12-12 DIAGNOSIS — Z79899 Other long term (current) drug therapy: Secondary | ICD-10-CM

## 2015-12-12 LAB — CBC WITH DIFFERENTIAL/PLATELET
Basophils Absolute: 54 cells/uL (ref 0–200)
Basophils Relative: 1 %
Eosinophils Absolute: 432 cells/uL (ref 15–500)
Eosinophils Relative: 8 %
HCT: 47.7 % (ref 38.5–50.0)
Hemoglobin: 15.8 g/dL (ref 13.2–17.1)
Lymphocytes Relative: 21 %
Lymphs Abs: 1134 cells/uL (ref 850–3900)
MCH: 28.1 pg (ref 27.0–33.0)
MCHC: 33.1 g/dL (ref 32.0–36.0)
MCV: 84.7 fL (ref 80.0–100.0)
MPV: 9.8 fL (ref 7.5–12.5)
Monocytes Absolute: 432 cells/uL (ref 200–950)
Monocytes Relative: 8 %
Neutro Abs: 3348 cells/uL (ref 1500–7800)
Neutrophils Relative %: 62 %
Platelets: 258 10*3/uL (ref 140–400)
RBC: 5.63 MIL/uL (ref 4.20–5.80)
RDW: 15.2 % — ABNORMAL HIGH (ref 11.0–15.0)
WBC: 5.4 10*3/uL (ref 3.8–10.8)

## 2015-12-12 LAB — TSH: TSH: 1.24 mIU/L (ref 0.40–4.50)

## 2015-12-12 LAB — LIPID PANEL
Cholesterol: 181 mg/dL (ref 125–200)
HDL: 51 mg/dL (ref >=40)
LDL Cholesterol: 90 mg/dL (ref <130)
Total CHOL/HDL Ratio: 3.5 Ratio (ref <=5.0)
Triglycerides: 198 mg/dL — ABNORMAL HIGH (ref <150)
VLDL: 40 mg/dL — ABNORMAL HIGH (ref <30)

## 2015-12-12 LAB — HEPATIC FUNCTION PANEL
ALT: 28 U/L (ref 9–46)
AST: 28 U/L (ref 10–35)
Albumin: 4.7 g/dL (ref 3.6–5.1)
Alkaline Phosphatase: 71 U/L (ref 40–115)
Bilirubin, Direct: 0.1 mg/dL (ref <=0.2)
Indirect Bilirubin: 0.3 mg/dL (ref 0.2–1.2)
Total Bilirubin: 0.4 mg/dL (ref 0.2–1.2)
Total Protein: 7.1 g/dL (ref 6.1–8.1)

## 2015-12-12 LAB — IRON AND TIBC
%SAT: 21 % (ref 15–60)
Iron: 93 ug/dL (ref 50–180)
TIBC: 436 ug/dL — ABNORMAL HIGH (ref 250–425)
UIBC: 343 ug/dL (ref 125–400)

## 2015-12-12 LAB — BASIC METABOLIC PANEL WITH GFR
BUN: 21 mg/dL (ref 7–25)
CO2: 27 mmol/L (ref 20–31)
Calcium: 9.7 mg/dL (ref 8.6–10.3)
Chloride: 104 mmol/L (ref 98–110)
Creat: 0.93 mg/dL (ref 0.70–1.33)
GFR, Est African American: >89 mL/min (ref >=60)
GFR, Est Non African American: >89 mL/min (ref >=60)
Glucose, Bld: 110 mg/dL — ABNORMAL HIGH (ref 65–99)
Potassium: 4.1 mmol/L (ref 3.5–5.3)
Sodium: 143 mmol/L (ref 135–146)

## 2015-12-12 LAB — VITAMIN B12: Vitamin B-12: 729 pg/mL (ref 200–1100)

## 2015-12-12 LAB — MAGNESIUM: Magnesium: 2 mg/dL (ref 1.5–2.5)

## 2015-12-12 LAB — PSA: PSA: 2.9 ng/mL (ref <=4.0)

## 2015-12-12 MED ORDER — DICYCLOMINE HCL 20 MG PO TABS: ORAL_TABLET | ORAL | 0 refills | Status: DC

## 2015-12-12 NOTE — Patient Instructions (Signed)

## 2015-12-12 NOTE — Progress Notes (Signed)
[facility]   Unk [name], M.D.    Uvaldo Bristle. Silverio Lay, P.A.-C      Starlyn Skeans, P.A.-C  [facility]                [address] [place] [id number] Telephone [phone] Telefax [phone]  Annual  Screening/Preventative Visit  & Comprehensive Evaluation & Examination        This very nice 60y.o.[name] presents for a Wellness/Preventative Visit & comprehensive evaluation and management of multiple medical co-morbidities.  Patient has been followed for HTN, Prediabetes, Hyperlipidemia and Vitamin D Deficiency.     HTN predates circa 1999. Patient's BP has been controlled at home.Today's BP: 132/82. Patient has CKD3 (GFR 69)  attributed to his HTN. Patient had a false (+) Cardiolite in 2008 confirmed with a negative/normal heart cath. Patient denies any cardiac symptoms as chest pain, palpitations, shortness of breath, dizziness or ankle swelling.     Patient's hyperlipidemia is controlled with diet and medications. Patient denies myalgias or other medication SE's. Last lipids were at goal: Lab Results  Component Value Date   CHOL 132 07/25/2015   HDL 55 07/25/2015   LDLCALC 56 07/25/2015   TRIG 105 07/25/2015   CHOLHDL 2.4 07/25/2015      Patient has prediabetes  With A1c 5.7% circa 2011, then 5.4% in 2014 and patient denies reactive hypoglycemic symptoms, visual blurring, diabetic polys or paresthesias. Last A1c was not at goal: Lab Results  Component Value Date   HGBA1C 5.8 (H) 07/25/2015       Finally, patient has history of Vitamin D Deficiency of  "48" and last vitamin D was not at goal: Lab Results  Component Value Date   VD25OH 50 07/25/2015   No Known Allergies   Past Medical History:  Diagnosis Date  . Hyperlipidemia   . Hypertension   . Hypogonadism, male   . Pre-diabetes    no meds  . Vitamin D deficiency    Health Maintenance  Topic Date Due  . Hepatitis C  Screening  03/05/1956  . HIV Screening  05/11/1971  . INFLUENZA VACCINE  11/05/2015  . TETANUS/TDAP  06/16/2018  . COLONOSCOPY  08/01/2024   Immunization History  Administered Date(s) Administered  . Influenza Split 01/08/2014  . Influenza Whole 01/05/2013  . Influenza-Unspecified 01/05/2015  . PPD Test 01/08/2014, 01/14/2015, 12/12/2015  . Tdap 06/15/2008   Past Surgical History:  Procedure Laterality Date  . ESOPHAGOGASTRODUODENOSCOPY    . KNEE SURGERY     left  . ORIF ACETABULAR FRACTURE     left arm 2 times   Family History  Problem Relation Age of Onset  . Hypertension Mother   . Hyperlipidemia Mother   . Diabetes Mother   . Hypertension Father    Social History   Social History  . Marital status: Married    Spouse name: N/A  . Number of children: N/A  . Years of education: N/A   Occupational History  . Systems analyst x 28 years at [employer]   Social History Main Topics  . Smoking status: Never Smoker  . Smokeless tobacco: Never Used  . Alcohol use 1.2 - 3.0 oz/week    2 - 5 Standard drinks or equivalent per week  . Drug use: No  . Sexual activity: Not on file    ROS Constitutional: Denies fever, chills, weight loss/gain,  headaches, insomnia,  night sweats or change in appetite. Does c/o fatigue. Eyes: Denies redness, blurred vision, diplopia, discharge, itchy or watery eyes.  ENT: Denies discharge, congestion, post nasal drip, epistaxis, sore throat, earache, hearing loss, dental pain, Tinnitus, Vertigo, Sinus pain or snoring.  Cardio: Denies chest pain, palpitations, irregular heartbeat, syncope, dyspnea, diaphoresis, orthopnea, PND, claudication or edema Respiratory: denies cough, dyspnea, DOE, pleurisy, hoarseness, laryngitis or wheezing.  Gastrointestinal: Denies dysphagia, heartburn, reflux, water brash, pain, cramps, nausea, vomiting, bloating, diarrhea, constipation, hematemesis, melena, hematochezia, jaundice or hemorrhoids Genitourinary:  Denies dysuria, frequency, urgency, nocturia, hesitancy, discharge, hematuria or flank pain Musculoskeletal: Denies arthralgia, myalgia, stiffness, Jt. Swelling, pain, limp or strain/sprain. Denies Falls. Skin: Denies puritis, rash, hives, warts, acne, eczema or change in skin lesion Neuro: No weakness, tremor, incoordination, spasms, paresthesia or pain Psychiatric: Denies confusion, memory loss or sensory loss. Denies Depression. Endocrine: Denies change in weight, skin, hair change, nocturia, and paresthesia, diabetic polys, visual blurring or hyper / hypo glycemic episodes.  Heme/Lymph: No excessive bleeding, bruising or enlarged lymph nodes.  Physical Exam  BP 132/82   Pulse 72   Temp 97.3 F (36.3 C)   Resp 16   Ht 5' 8.25" (1.734 m)   Wt 193 lb 6.4 oz (87.7 kg)   BMI 29.19 kg/m   General Appearance: Well nourished, in no apparent distress.  Eyes: PERRLA, EOMs, conjunctiva no swelling or erythema, normal fundi and vessels. Sinuses: No frontal/maxillary tenderness ENT/Mouth: EACs patent / TMs  nl. Nares clear without erythema, swelling, mucoid exudates. Oral hygiene is good. No erythema, swelling, or exudate. Tongue normal, non-obstructing. Tonsils not swollen or erythematous. Hearing normal.  Neck: Supple, thyroid normal. No bruits, nodes or JVD. Respiratory: Respiratory effort normal.  BS equal and clear bilateral without rales, rhonci, wheezing or stridor. Cardio: Heart sounds are normal with regular rate and rhythm and no murmurs, rubs or gallops. Peripheral pulses are normal and equal bilaterally without edema. No aortic or femoral bruits. Chest: symmetric with normal excursions and percussion.  Abdomen: Soft, with Nl bowel sounds. Nontender, no guarding, rebound, hernias, masses, or organomegaly.  Lymphatics: Non tender without lymphadenopathy.  Genitourinary: No hernias.Testes nl. DRE - prostate nl for age - smooth & firm w/o nodules. Musculoskeletal: Full ROM all  peripheral extremities, joint stability, 5/5 strength, and normal gait. Skin: Warm and dry without rashes, lesions, cyanosis, clubbing or  ecchymosis.  Neuro: Cranial nerves intact, reflexes equal bilaterally. Normal muscle tone, no cerebellar symptoms. Sensation intact.  Pysch: Alert and oriented X 3 with normal affect, insight and judgment appropriate.   Assessment and Plan  1. Annual Preventative/Screening Exam   - Microalbumin / creatinine urine ratio - Urinalysis, Routine w reflex microscopic  - Iron and TIBC - PSA - Testosterone - CBC with Differential/Platelet - BASIC METABOLIC PANEL WITH GFR - Hepatic function panel - Magnesium - Lipid panel - TSH - Hemoglobin A1c - Insulin, random - VITAMIN D 25 Hydroxy   2. Essential hypertension  - Microalbumin / creatinine urine ratio - TSH  3. Hyperlipidemia  - Lipid panel - TSH  4. Pre-diabetes  - Hemoglobin A1c - Insulin, random  5. Vitamin D deficiency  - VITAMIN D 25 Hydroxy   6. Testosterone Deficiency  - Testosterone  7. Nonintractable migraine   8. Testosterone deficiency   9. Screening for rectal cancer  - POC Hemoccult Bld/Stl   10. Prostate cancer screening  - PSA  11. Screening for ischemic heart disease   12. Screening for AAA (  aortic abdominal aneurysm)   13. Other fatigue  - Iron and TIBC - Testosterone - CBC with Differential/Platelet - TSH - Vitamin B12  14. Medication management  - Urinalysis, Routine w reflex microscopic  - CBC with Differential/Platelet - BASIC METABOLIC PANEL WITH GFR - Hepatic function panel - Magnesium  15. Screening examination for pulmonary tuberculosis  - PPD     Continue prudent diet as discussed, weight control, BP monitoring, regular exercise, and medications as discussed.  Discussed med effects and SE's. Routine screening labs and tests as requested with regular follow-up as recommended. Over 40 minutes of exam, counseling, chart review and  high complex critical decision making was performed

## 2015-12-13 LAB — URINALYSIS, ROUTINE W REFLEX MICROSCOPIC
Bilirubin Urine: NEGATIVE
Glucose, UA: NEGATIVE
Hgb urine dipstick: NEGATIVE
Ketones, ur: NEGATIVE
Leukocytes, UA: NEGATIVE
Nitrite: NEGATIVE
Protein, ur: NEGATIVE
Specific Gravity, Urine: 1.017 (ref 1.001–1.035)
pH: 6.5 (ref 5.0–8.0)

## 2015-12-13 LAB — INSULIN, RANDOM: Insulin: 144 u[IU]/mL — ABNORMAL HIGH (ref 2.0–19.6)

## 2015-12-13 LAB — MICROALBUMIN / CREATININE URINE RATIO
Creatinine, Urine: 94 mg/dL (ref 20–370)
Microalb Creat Ratio: 19 mcg/mg creat (ref <30)
Microalb, Ur: 1.8 mg/dL

## 2015-12-13 LAB — HEMOGLOBIN A1C
Hgb A1c MFr Bld: 5.5 % (ref <5.7)
Mean Plasma Glucose: 111 mg/dL

## 2015-12-13 LAB — TESTOSTERONE: Testosterone: 285 ng/dL (ref 250–827)

## 2015-12-13 LAB — VITAMIN D 25 HYDROXY (VIT D DEFICIENCY, FRACTURES): Vit D, 25-Hydroxy: 62 ng/mL (ref 30–100)

## 2015-12-19 LAB — TB SKIN TEST
Induration: 0 mm
TB Skin Test: NEGATIVE

## 2016-01-16 ENCOUNTER — Other Ambulatory Visit: Payer: Self-pay | Admitting: *Deleted

## 2016-01-16 DIAGNOSIS — Z1212 Encounter for screening for malignant neoplasm of rectum: Secondary | ICD-10-CM

## 2016-01-16 LAB — POC HEMOCCULT BLD/STL (HOME/3-CARD/SCREEN)
Card #2 Fecal Occult Blod, POC: NEGATIVE
Card #3 Fecal Occult Blood, POC: NEGATIVE
Fecal Occult Blood, POC: NEGATIVE

## 2016-01-24 ENCOUNTER — Other Ambulatory Visit: Payer: Self-pay | Admitting: Internal Medicine

## 2016-02-10 ENCOUNTER — Encounter: Payer: Self-pay | Admitting: Internal Medicine

## 2016-02-14 ENCOUNTER — Other Ambulatory Visit: Payer: Self-pay | Admitting: Internal Medicine

## 2016-02-24 ENCOUNTER — Other Ambulatory Visit: Payer: Self-pay | Admitting: Internal Medicine

## 2016-03-17 ENCOUNTER — Other Ambulatory Visit: Payer: Self-pay | Admitting: Internal Medicine

## 2016-03-19 ENCOUNTER — Ambulatory Visit (INDEPENDENT_AMBULATORY_CARE_PROVIDER_SITE_OTHER): Payer: 59 | Admitting: Internal Medicine

## 2016-03-19 ENCOUNTER — Encounter: Payer: Self-pay | Admitting: Internal Medicine

## 2016-03-19 VITALS — BP 126/80 | HR 72 | Temp 98.2°F | Resp 16 | Ht 68.25 in | Wt 200.0 lb

## 2016-03-19 DIAGNOSIS — G43009 Migraine without aura, not intractable, without status migrainosus: Secondary | ICD-10-CM | POA: Diagnosis not present

## 2016-03-19 DIAGNOSIS — E559 Vitamin D deficiency, unspecified: Secondary | ICD-10-CM | POA: Diagnosis not present

## 2016-03-19 DIAGNOSIS — E782 Mixed hyperlipidemia: Secondary | ICD-10-CM

## 2016-03-19 DIAGNOSIS — R7303 Prediabetes: Secondary | ICD-10-CM | POA: Diagnosis not present

## 2016-03-19 DIAGNOSIS — F418 Other specified anxiety disorders: Secondary | ICD-10-CM | POA: Diagnosis not present

## 2016-03-19 DIAGNOSIS — Z79899 Other long term (current) drug therapy: Secondary | ICD-10-CM | POA: Diagnosis not present

## 2016-03-19 DIAGNOSIS — I10 Essential (primary) hypertension: Secondary | ICD-10-CM | POA: Diagnosis not present

## 2016-03-19 NOTE — Progress Notes (Signed)
Assessment and Plan:  Hypertension:  -Continue medication -monitor blood pressure at home. -Continue DASH diet -Reminder to go to the ER if any CP, SOB, nausea, dizziness, severe HA, changes vision/speech, left arm numbness and tingling and jaw pain.  Cholesterol - Continue diet and exercise   Prediabetes -Continue diet and exercise.   Vitamin D Def -continue medications.   Migraines -cont meds -well controlled  Anxiety -take personal time -increase exercise Continue diet and meds as discussed. Further disposition pending results of labs. Discussed med's effects and SE's.    HPI 60 y.o. male  presents for 3 month follow up with hypertension, hyperlipidemia, diabetes and vitamin D deficiency.   His blood pressure has been controlled at home, today their BP is BP: 126/80.He does workout. He denies chest pain, shortness of breath, dizziness.   He is on cholesterol medication and denies myalgias. His cholesterol is at goal. The cholesterol was:  12/12/2015: Cholesterol 181; HDL 51; LDL Cholesterol 90; Triglycerides 198   He has been working on diet and exercise for prediabetes without complications, he is not on bASA, he is not on ACE/ARB, and denies  foot ulcerations, hyperglycemia, hypoglycemia , increased appetite, nausea, paresthesia of the feet, polydipsia, polyuria, visual disturbances, vomiting and weight loss. Last A1C was: 12/12/2015: Hgb A1c MFr Bld 5.5   Patient is on Vitamin D supplement. 12/12/2015: Vit D, 25-Hydroxy 62  He reports that he does still get headaches but is has only been 5-6 headaches per six months.  He reports that he has been doing pretty well lately.     Current Medications:  Current Outpatient Prescriptions on File Prior to Visit  Medication Sig Dispense Refill  . ALPRAZolam (XANAX) 0.5 MG tablet TAKE 1/2 TO 1 TABLET 3 TIMES DAILY AS NEEDED FOR ANXIETY 90 tablet 0  . Ascorbic Acid (VITAMIN C) 1000 MG tablet Take 1,000 mg by mouth 2 (two) times daily.     . Cholecalciferol (VITAMIN D-3) 5000 UNITS TABS Take 2,000 Units by mouth daily.     Marland Kitchen CIALIS 5 MG tablet TAKE 1 TABLET DAILY FOR    PROSTATISM 90 tablet 1  . clobetasol cream (TEMOVATE) 0.05 % APPLY TO RASH TWICE DAILY AS NEEDED 60 g 4  . cyclobenzaprine (FLEXERIL) 10 MG tablet Take 10 mg by mouth as needed for muscle spasms.    Marland Kitchen dicyclomine (BENTYL) 20 MG tablet TAKE 1 TABLET 3 TO 4 X/ DAY AS NEEDED FOR IBS 90 tablet 0  . Flaxseed, Linseed, (FLAX SEED OIL PO) Take 1,000 mg by mouth 3 (three) times daily.     . Glucosamine-Chondroitin (GLUCOSAMINE CHONDR COMPLEX PO) Take by mouth. Take 2 tablets daily    . isometheptene-acetaminophen-dichloralphenazone (MIDRIN) 65-100-325 MG capsule TAKE 1-2 CAPSULES BY MOUTH EVERY 4 HOURS AS NEEDED FOR HEADACHE 100 capsule 0  . Magnesium 500 MG TABS Take 2 tablets by mouth daily.    . Melatonin 10 MG CAPS Take by mouth Nightly.    . meloxicam (MOBIC) 15 MG tablet Take 15 mg by mouth as needed for pain.    . moexipril (UNIVASC) 15 MG tablet TAKE 1 TABLET DAILY FOR    BLOOD PRESSURE 90 tablet 1  . Omega-3 Fatty Acids (FISH OIL PO) Take 1,000 mg by mouth 3 (three) times daily.     Dan Europe XSAIL 11 MG/NOSEPC EXHP PRN for migraines  1  . pregabalin (LYRICA) 200 MG capsule Take 200 mg by mouth 2 (two) times daily.    . Probiotic Product (  ALIGN) 4 MG CAPS Take 1 capsule daily for Probiotic Benefit 90 capsule 1  . rosuvastatin (CRESTOR) 40 MG tablet TAKE 1 TABLET DAILY 90 tablet 1  . TAZORAC 0.05 % cream as needed.     . Testosterone (FORTESTA) 10 MG/ACT (2%) GEL Apply 4 pumps total daily to thighs daily. 180 g 1  . vitamin B-12 (CYANOCOBALAMIN) 100 MCG tablet Take 100 mcg by mouth daily.    Marland Kitchen zolmitriptan (ZOMIG) 5 MG tablet as needed.   0   No current facility-administered medications on file prior to visit.    Medical History:  Past Medical History:  Diagnosis Date  . Hyperlipidemia   . Hypertension   . Hypogonadism, male   . Pre-diabetes    no meds   . Vitamin D deficiency    Allergies: No Known Allergies   Review of Systems:  Review of Systems  Constitutional: Negative for chills, fever and malaise/fatigue.  HENT: Negative for congestion, ear pain and sore throat.   Eyes: Negative.   Respiratory: Negative for cough, shortness of breath and wheezing.   Cardiovascular: Negative for chest pain, palpitations and leg swelling.  Gastrointestinal: Negative for abdominal pain, blood in stool, constipation, diarrhea, heartburn and melena.  Genitourinary: Negative.   Skin: Negative.   Neurological: Positive for headaches. Negative for dizziness, sensory change and loss of consciousness.  Psychiatric/Behavioral: Negative for depression. The patient is nervous/anxious. The patient does not have insomnia.     Family history- Review and unchanged  Social history- Review and unchanged  Physical Exam: BP 126/80   Pulse 72   Temp 98.2 F (36.8 C) (Temporal)   Resp 16   Ht 5' 8.25" (1.734 m)   Wt 200 lb (90.7 kg)   BMI 30.19 kg/m  Wt Readings from Last 3 Encounters:  03/19/16 200 lb (90.7 kg)  12/12/15 193 lb 6.4 oz (87.7 kg)  10/30/15 195 lb 9.6 oz (88.7 kg)   General Appearance: Well nourished well developed, non-toxic appearing, in no apparent distress. Eyes: PERRLA, EOMs, conjunctiva no swelling or erythema ENT/Mouth: Ear canals clear with no erythema, swelling, or discharge.  TMs normal bilaterally, oropharynx clear, moist, with no exudate.   Neck: Supple, thyroid normal, no JVD, no cervical adenopathy.  Respiratory: Respiratory effort normal, breath sounds clear A&P, no wheeze, rhonchi or rales noted.  No retractions, no accessory muscle usage Cardio: RRR with no MRGs. No noted edema.  Abdomen: Soft, + BS.  Non tender, no guarding, rebound, hernias, masses. Musculoskeletal: Full ROM, 5/5 strength, Normal gait Skin: Warm, dry without rashes, lesions, ecchymosis.  Neuro: Awake and oriented X 3, Cranial nerves intact. No  cerebellar symptoms.  Psych: normal affect, Insight and Judgment appropriate.    Starlyn Skeans, PA-C 3:53 PM [facility]

## 2016-03-23 ENCOUNTER — Encounter: Payer: Self-pay | Admitting: Internal Medicine

## 2016-03-23 ENCOUNTER — Other Ambulatory Visit: Payer: Self-pay | Admitting: Internal Medicine

## 2016-03-23 MED ORDER — LINACLOTIDE 72 MCG PO CAPS: 72.0000 ug | ORAL_CAPSULE | Freq: Every day | ORAL | 2 refills | Status: DC

## 2016-04-02 ENCOUNTER — Ambulatory Visit
Admission: EM | Admit: 2016-04-02 | Discharge: 2016-04-02 | Disposition: A | Discharge status: Home / Self Care | Payer: 59 | Attending: Family | Admitting: Family

## 2016-04-02 ENCOUNTER — Encounter: Payer: Self-pay | Admitting: *Deleted

## 2016-04-02 DIAGNOSIS — J0110 Acute frontal sinusitis, unspecified: Secondary | ICD-10-CM

## 2016-04-02 MED ORDER — AMOXICILLIN-POT CLAVULANATE 875-125 MG PO TABS: 1.0000 | ORAL_TABLET | Freq: Two times a day (BID) | ORAL | 0 refills | Status: AC

## 2016-04-02 NOTE — Discharge Instructions (Signed)
Start Augmentin twice a day as directed. Continue Flonase daily as directed. May use Mobic daily for headache/pain. Increase fluid intake to help loosen mucus. Follow-up with your primary care provider next week for recheck or return here for re-evaluation if not improving within 3 to 4 days.

## 2016-04-02 NOTE — ED Triage Notes (Signed)
Patient started having symptoms of nasal congestion and bilateral ear fullness 1 week ago. No other symptoms reported.

## 2016-04-02 NOTE — ED Provider Notes (Signed)
CSN: [id number]     Arrival date & time 04/02/16  1329 History   First MD Initiated Contact with Patient 04/02/16 1451     Chief Complaint  Patient presents with  . Otalgia  . Nasal Congestion   (Consider location/radiation/quality/duration/timing/severity/associated sxs/prior Treatment) 60 year old male presents with nasal congestion, sinus pressure, headache and ear fullness for over 1 week. Denies any fever, cough or GI symptoms. Has history of severe migraine headaches which are controlled with multiple medications. Also has history of sinus infections- can tell difference between headaches. This current constant headache is located around frontal sinus area. He has taken Flonase and OTC sinus medication with minimal relief.    The history is provided by the patient.    Past Medical History:  Diagnosis Date  . Hyperlipidemia   . Hypertension   . Hypogonadism, male   . Pre-diabetes    no meds  . Vitamin D deficiency    Past Surgical History:  Procedure Laterality Date  . ESOPHAGOGASTRODUODENOSCOPY    . KNEE SURGERY     left  . ORIF ACETABULAR FRACTURE     left arm 2 times   Family History  Problem Relation Age of Onset  . Hypertension Mother   . Hyperlipidemia Mother   . Diabetes Mother   . Hypertension Father    Social History  Substance Use Topics  . Smoking status: Never Smoker  . Smokeless tobacco: Never Used  . Alcohol use 1.2 - 3.0 oz/week    2 - 5 Standard drinks or equivalent per week    Review of Systems  Constitutional: Positive for fatigue. Negative for appetite change, chills and fever.  HENT: Positive for congestion, ear pain (ear fullness bilaterally), sinus pain and sinus pressure. Negative for sore throat.   Eyes: Negative for discharge.  Respiratory: Negative for cough, chest tightness, shortness of breath and wheezing.   Cardiovascular: Negative for chest pain.  Gastrointestinal: Negative for diarrhea, nausea and vomiting.  Musculoskeletal:  Negative for neck pain and neck stiffness.  Skin: Negative for rash.  Neurological: Positive for headaches. Negative for dizziness, syncope, weakness and light-headedness.    Allergies  Patient has no known allergies.  Home Medications    Meds Ordered and Administered this Visit  Medications - No data to display  BP 140/84 (BP Location: Left Arm)   Pulse 62   Temp 98 F (36.7 C) (Oral)   Resp 16   Ht 5\' 10"  (1.778 m)   Wt 185 lb (83.9 kg)   SpO2 97%   BMI 26.54 kg/m  No data found.   Physical Exam  Constitutional: He is oriented to person, place, and time. He appears well-developed and well-nourished. No distress.  HENT:  Head: Normocephalic and atraumatic.  Right Ear: Hearing, external ear and ear canal normal. Tympanic membrane is bulging. Tympanic membrane is not injected and not erythematous.  Left Ear: Hearing, external ear and ear canal normal. Tympanic membrane is bulging. Tympanic membrane is not injected and not erythematous.  Nose: Mucosal edema and rhinorrhea present. Right sinus exhibits maxillary sinus tenderness and frontal sinus tenderness. Left sinus exhibits maxillary sinus tenderness and frontal sinus tenderness.  Mouth/Throat: Uvula is midline and mucous membranes are normal. Posterior oropharyngeal erythema present.  Neck: Normal range of motion. Neck supple.  Cardiovascular: Normal rate, regular rhythm and normal heart sounds.   Pulmonary/Chest: Effort normal and breath sounds normal. No respiratory distress. He has no decreased breath sounds. He has no wheezes. He has no  rhonchi.  Lymphadenopathy:    He has no cervical adenopathy.  Neurological: He is alert and oriented to person, place, and time.  Skin: Skin is warm and dry.  Psychiatric: He has a normal mood and affect. His behavior is normal. Judgment and thought content normal.    Urgent Care Course   Clinical Course     Procedures (including critical care time)  Labs Review Labs Reviewed  - No data to display  Imaging Review No results found.   Visual Acuity Review  Right Eye Distance:   Left Eye Distance:   Bilateral Distance:    Right Eye Near:   Left Eye Near:    Bilateral Near:         MDM   1. Acute non-recurrent frontal sinusitis    Recommend start Augmentin 875mg  twice a day as directed. Continue Flonase daily as directed. May use Mobic daily for headache/pain. Increase fluid intake to help loosen mucus. Follow-up with his primary care provider in 5 to 7 days for recheck or return here for re-evaluation if not improving within 3 to 4 days.     Gerald Apo, NP 04/02/16 [phone]

## 2016-05-28 ENCOUNTER — Other Ambulatory Visit: Payer: Self-pay | Admitting: *Deleted

## 2016-05-28 DIAGNOSIS — E349 Endocrine disorder, unspecified: Secondary | ICD-10-CM

## 2016-05-28 MED ORDER — TESTOSTERONE 10 MG/ACT (2%) TD GEL: TRANSDERMAL | 1 refills | Status: DC

## 2016-06-03 ENCOUNTER — Other Ambulatory Visit: Payer: Self-pay | Admitting: Physician Assistant

## 2016-06-22 ENCOUNTER — Encounter: Payer: Self-pay | Admitting: Internal Medicine

## 2016-06-22 ENCOUNTER — Other Ambulatory Visit: Payer: Self-pay | Admitting: *Deleted

## 2016-06-22 ENCOUNTER — Ambulatory Visit (INDEPENDENT_AMBULATORY_CARE_PROVIDER_SITE_OTHER): Payer: 59 | Admitting: Internal Medicine

## 2016-06-22 VITALS — BP 126/80 | HR 64 | Temp 97.3°F | Resp 16 | Ht 68.25 in | Wt 198.2 lb

## 2016-06-22 DIAGNOSIS — E291 Testicular hypofunction: Secondary | ICD-10-CM

## 2016-06-22 DIAGNOSIS — I10 Essential (primary) hypertension: Secondary | ICD-10-CM

## 2016-06-22 DIAGNOSIS — E782 Mixed hyperlipidemia: Secondary | ICD-10-CM | POA: Diagnosis not present

## 2016-06-22 DIAGNOSIS — Z79899 Other long term (current) drug therapy: Secondary | ICD-10-CM | POA: Diagnosis not present

## 2016-06-22 DIAGNOSIS — R7303 Prediabetes: Secondary | ICD-10-CM | POA: Diagnosis not present

## 2016-06-22 DIAGNOSIS — E559 Vitamin D deficiency, unspecified: Secondary | ICD-10-CM | POA: Diagnosis not present

## 2016-06-22 LAB — HEPATIC FUNCTION PANEL
ALT: 26 U/L (ref 9–46)
AST: 29 U/L (ref 10–35)
Albumin: 4.3 g/dL (ref 3.6–5.1)
Alkaline Phosphatase: 67 U/L (ref 40–115)
Bilirubin, Direct: 0.1 mg/dL (ref <=0.2)
Indirect Bilirubin: 0.2 mg/dL (ref 0.2–1.2)
Total Bilirubin: 0.3 mg/dL (ref 0.2–1.2)
Total Protein: 6.3 g/dL (ref 6.1–8.1)

## 2016-06-22 LAB — CBC WITH DIFFERENTIAL/PLATELET
Basophils Absolute: 54 cells/uL (ref 0–200)
Basophils Relative: 1 %
Eosinophils Absolute: 270 cells/uL (ref 15–500)
Eosinophils Relative: 5 %
HCT: 41.6 % (ref 38.5–50.0)
Hemoglobin: 13.4 g/dL (ref 13.2–17.1)
Lymphocytes Relative: 24 %
Lymphs Abs: 1296 cells/uL (ref 850–3900)
MCH: 27.1 pg (ref 27.0–33.0)
MCHC: 32.2 g/dL (ref 32.0–36.0)
MCV: 84 fL (ref 80.0–100.0)
MPV: 9.2 fL (ref 7.5–12.5)
Monocytes Absolute: 486 cells/uL (ref 200–950)
Monocytes Relative: 9 %
Neutro Abs: 3294 cells/uL (ref 1500–7800)
Neutrophils Relative %: 61 %
Platelets: 264 10*3/uL (ref 140–400)
RBC: 4.95 MIL/uL (ref 4.20–5.80)
RDW: 14.4 % (ref 11.0–15.0)
WBC: 5.4 10*3/uL (ref 3.8–10.8)

## 2016-06-22 LAB — BASIC METABOLIC PANEL WITH GFR
BUN: 19 mg/dL (ref 7–25)
CO2: 26 mmol/L (ref 20–31)
Calcium: 9 mg/dL (ref 8.6–10.3)
Chloride: 105 mmol/L (ref 98–110)
Creat: 1 mg/dL (ref 0.70–1.25)
GFR, Est African American: >89 mL/min (ref >=60)
GFR, Est Non African American: 81 mL/min (ref >=60)
Glucose, Bld: 85 mg/dL (ref 65–99)
Potassium: 3.9 mmol/L (ref 3.5–5.3)
Sodium: 140 mmol/L (ref 135–146)

## 2016-06-22 LAB — MAGNESIUM: Magnesium: 2.1 mg/dL (ref 1.5–2.5)

## 2016-06-22 LAB — TSH: TSH: 0.76 mIU/L (ref 0.40–4.50)

## 2016-06-22 LAB — LIPID PANEL
Cholesterol: 125 mg/dL (ref <200)
HDL: 47 mg/dL (ref >40)
LDL Cholesterol: 52 mg/dL (ref <100)
Total CHOL/HDL Ratio: 2.7 Ratio (ref <5.0)
Triglycerides: 128 mg/dL (ref <150)
VLDL: 26 mg/dL (ref <30)

## 2016-06-22 MED ORDER — MELOXICAM 15 MG PO TABS: 15.0000 mg | ORAL_TABLET | ORAL | 1 refills | Status: DC | PRN

## 2016-06-22 MED ORDER — ALPRAZOLAM 0.5 MG PO TABS: ORAL_TABLET | ORAL | 1 refills | Status: DC

## 2016-06-22 NOTE — Progress Notes (Signed)
This very nice 61 y.o. [name] presents for 6 month follow up with Hypertension, Hyperlipidemia, Pre-Diabetes, Testosterone  and Vitamin D Deficiency.      Patient is treated for HTN (1999)  & BP has been controlled at home. Today's BP is at goal -  126/80.  In 2008, he had a false (+) Cardiolite confirmed by a negative heart cath. He also has CKD 3 (GFR 69) attributed to his HTN. Patient has had no complaints of any cardiac type chest pain, palpitations, dyspnea/orthopnea/PND, dizziness, claudication, or dependent edema.     Hyperlipidemia is controlled with diet & meds. Patient denies myalgias or other med SE's. Last Lipids were at goal albeit elevated Trig's: Lab Results  Component Value Date   CHOL 181 12/12/2015   HDL 51 12/12/2015   LDLCALC 90 12/12/2015   TRIG 198 (H) 12/12/2015   CHOLHDL 3.5 12/12/2015      Also, the patient has history of PreDiabetes (A1c 5.7% in 2011 and then 5.4% in 2014) and has had no symptoms of reactive hypoglycemia, diabetic polys, paresthesias or visual blurring.  Last A1c was still at goal: Lab Results  Component Value Date   HGBA1C 5.5 12/12/2015      Patient also has been on Testosterone replacement since 2005  with improved stamina and sense of well being. Further, the patient also has history of Vitamin D Deficiency and supplements vitamin D without any suspected side-effects. Last vitamin D was at goal:  Lab Results  Component Value Date   VD25OH 62 12/12/2015    No Known Allergies PMHx:   Past Medical History:  Diagnosis Date  . Hyperlipidemia   . Hypertension   . Pre-diabetes    no meds  . Vitamin D deficiency    Immunization History  Administered Date(s) Administered  . Influenza Split 01/08/2014  . Influenza Whole 01/05/2013  . Influenza-Unspecified 01/05/2015  . PPD Test 01/08/2014, 01/14/2015, 12/12/2015  . Tdap 06/15/2008   Past Surgical History:  Procedure Laterality Date  . ESOPHAGOGASTRODUODENOSCOPY    . KNEE SURGERY       left  . ORIF ACETABULAR FRACTURE     left arm 2 times   FHx:    Reviewed / unchanged  SHx:    Reviewed / unchanged  Systems Review:  Constitutional: Denies fever, chills, wt changes, headaches, insomnia, fatigue, night sweats, change in appetite. Eyes: Denies redness, blurred vision, diplopia, discharge, itchy, watery eyes.  ENT: Denies discharge, congestion, post nasal drip, epistaxis, sore throat, earache, hearing loss, dental pain, tinnitus, vertigo, sinus pain, snoring.  CV: Denies chest pain, palpitations, irregular heartbeat, syncope, dyspnea, diaphoresis, orthopnea, PND, claudication or edema. Respiratory: denies cough, dyspnea, DOE, pleurisy, hoarseness, laryngitis, wheezing.  Gastrointestinal: Denies dysphagia, odynophagia, heartburn, reflux, water brash, abdominal pain or cramps, nausea, vomiting, bloating, diarrhea, constipation, hematemesis, melena, hematochezia  or hemorrhoids. Genitourinary: Denies dysuria, frequency, urgency, nocturia, hesitancy, discharge, hematuria or flank pain. Musculoskeletal: Denies arthralgias, myalgias, stiffness, jt. swelling, pain, limping or strain/sprain.  Skin: Denies pruritus, rash, hives, warts, acne, eczema or change in skin lesion(s). Neuro: No weakness, tremor, incoordination, spasms, paresthesia or pain. Psychiatric: Denies confusion, memory loss or sensory loss. Endo: Denies change in weight, skin or hair change.  Heme/Lymph: No excessive bleeding, bruising or enlarged lymph nodes.  Physical Exam  BP 126/80   Pulse 64   Temp 97.3 F (36.3 C)   Resp 16   Ht 5' 8.25" (1.734 m)   Wt 198 lb 3.2 oz (89.9 kg)  BMI 29.92 kg/m   Appears well nourished and in no distress.  Eyes: PERRLA, EOMs, conjunctiva no swelling or erythema. Sinuses: No frontal/maxillary tenderness ENT/Mouth: EAC's clear, TM's nl w/o erythema, bulging. Nares clear w/o erythema, swelling, exudates. Oropharynx clear without erythema or exudates. Oral hygiene is  good. Tongue normal, non obstructing. Hearing intact.  Neck: Supple. Thyroid nl. Car 2+/2+ without bruits, nodes or JVD. Chest: Respirations nl with BS clear & equal w/o rales, rhonchi, wheezing or stridor.  Cor: Heart sounds normal w/ regular rate and rhythm without sig. murmurs, gallops, clicks, or rubs. Peripheral pulses normal and equal  without edema.  Abdomen: Soft & bowel sounds normal. Non-tender w/o guarding, rebound, hernias, masses, or organomegaly.  Lymphatics: Unremarkable.  Musculoskeletal: Full ROM all peripheral extremities, joint stability, 5/5 strength, and normal gait.  Skin: Warm, dry without exposed rashes, lesions or ecchymosis apparent.  Neuro: Cranial nerves intact, reflexes equal bilaterally. Sensory-motor testing grossly intact. Tendon reflexes grossly intact.  Pysch: Alert & oriented x 3.  Insight and judgement nl & appropriate. No ideations.  Assessment and Plan:  1. Essential hypertension  - Continue medication, monitor blood pressure at home.  - Continue DASH diet. Reminder to go to the ER if any CP,  SOB, nausea, dizziness, severe HA, changes vision/speech,  left arm numbness and tingling and jaw pain.  - CBC with Differential/Platelet - BASIC METABOLIC PANEL WITH GFR - Magnesium - TSH  2. Mixed hyperlipidemia  - Continue diet/meds, exercise,& lifestyle modifications.  - Continue monitor periodic cholesterol/liver & renal functions   - Hepatic function panel - Lipid panel - TSH  3. Pre-diabetes  - Continue diet, exercise, lifestyle modifications.  - Monitor appropriate labs.  - Hemoglobin A1c - Insulin, random  4. Vitamin D deficiency  - Continue supplementation. - VITAMIN D 25 Hydroxy   5. Testosterone Deficiency  - Testosterone  6. Medication management  - CBC with Differential/Platelet - BASIC METABOLIC PANEL WITH GFR - Hepatic function panel - Magnesium - Lipid panel - TSH - Hemoglobin A1c - Insulin, random - VITAMIN D  25 Hydroxy  - Testosterone       Recommended regular exercise, BP monitoring, weight control, and discussed med and SE's. Recommended labs to assess and monitor clinical status. Further disposition pending results of labs. Over 30 minutes of exam, counseling, chart review was performed

## 2016-06-22 NOTE — Patient Instructions (Signed)

## 2016-06-22 NOTE — Progress Notes (Signed)
This very nice 61 y.o. [name]  presents for 6 month follow up with Hypertension, Hyperlipidemia, Pre-Diabetes and Vitamin D Deficiency.      Patient is treated for HTN (1999) & BP has been controlled at home. Today's BP: 126/80.  In 2008, he had a false (+) Cardiolite confirmed by a negative heart cath. Patient has had no complaints of any cardiac type chest pain, palpitations, dyspnea/orthopnea/PND, dizziness, claudication, or dependent edema.     Hyperlipidemia is controlled with diet & meds. Patient denies myalgias or other med SE's. Last Lipids were  Lab Results  Component Value Date   CHOL 181 12/12/2015   HDL 51 12/12/2015   LDLCALC 90 12/12/2015   TRIG 198 (H) 12/12/2015   CHOLHDL 3.5 12/12/2015      Also, the patient has history of PreDiabetes and has had no symptoms of reactive hypoglycemia, diabetic polys, paresthesias or visual blurring.  Last A1c was  Lab Results  Component Value Date   HGBA1C 5.5 12/12/2015      Further, the patient also has history of Vitamin D Deficiency and supplements vitamin D without any suspected side-effects. Last vitamin D was   Lab Results  Component Value Date   VD25OH 79 12/12/2015    Not on File PMHx:   Past Medical History:  Diagnosis Date  . Hyperlipidemia   . Hypertension   . Pre-diabetes    no meds  . Vitamin D deficiency    Immunization History  Administered Date(s) Administered  . Influenza Split 01/08/2014  . Influenza Whole 01/05/2013  . Influenza-Unspecified 01/05/2015  . PPD Test 01/08/2014, 01/14/2015, 12/12/2015  . Tdap 06/15/2008   Past Surgical History:  Procedure Laterality Date  . ESOPHAGOGASTRODUODENOSCOPY    . KNEE SURGERY     left  . ORIF ACETABULAR FRACTURE     left arm 2 times   FHx:    Reviewed / unchanged  SHx:    Reviewed / unchanged  Systems Review:  Constitutional: Denies fever, chills, wt changes, headaches, insomnia, fatigue, night sweats, change in appetite. Eyes: Denies redness,  blurred vision, diplopia, discharge, itchy, watery eyes.  ENT: Denies discharge, congestion, post nasal drip, epistaxis, sore throat, earache, hearing loss, dental pain, tinnitus, vertigo, sinus pain, snoring.  CV: Denies chest pain, palpitations, irregular heartbeat, syncope, dyspnea, diaphoresis, orthopnea, PND, claudication or edema. Respiratory: denies cough, dyspnea, DOE, pleurisy, hoarseness, laryngitis, wheezing.  Gastrointestinal: Denies dysphagia, odynophagia, heartburn, reflux, water brash, abdominal pain or cramps, nausea, vomiting, bloating, diarrhea, constipation, hematemesis, melena, hematochezia  or hemorrhoids. Genitourinary: Denies dysuria, frequency, urgency, nocturia, hesitancy, discharge, hematuria or flank pain. Musculoskeletal: Denies arthralgias, myalgias, stiffness, jt. swelling, pain, limping or strain/sprain.  Skin: Denies pruritus, rash, hives, warts, acne, eczema or change in skin lesion(s). Neuro: No weakness, tremor, incoordination, spasms, paresthesia or pain. Psychiatric: Denies confusion, memory loss or sensory loss. Endo: Denies change in weight, skin or hair change.  Heme/Lymph: No excessive bleeding, bruising or enlarged lymph nodes.  Physical Exam  BP 126/80   Pulse 64   Temp 97.3 F (36.3 C)   Resp 16   Ht 5' 8.25" (1.734 m)   Wt 198 lb 3.2 oz (89.9 kg)   BMI 29.92 kg/m   Appears well nourished and in no distress.  Eyes: PERRLA, EOMs, conjunctiva no swelling or erythema. Sinuses: No frontal/maxillary tenderness ENT/Mouth: EAC's clear, TM's nl w/o erythema, bulging. Nares clear w/o erythema, swelling, exudates. Oropharynx clear without erythema or exudates. Oral hygiene is good. Tongue normal,  non obstructing. Hearing intact.  Neck: Supple. Thyroid nl. Car 2+/2+ without bruits, nodes or JVD. Chest: Respirations nl with BS clear & equal w/o rales, rhonchi, wheezing or stridor.  Cor: Heart sounds normal w/ regular rate and rhythm without sig.  murmurs, gallops, clicks, or rubs. Peripheral pulses normal and equal  without edema.  Abdomen: Soft & bowel sounds normal. Non-tender w/o guarding, rebound, hernias, masses, or organomegaly.  Lymphatics: Unremarkable.  Musculoskeletal: Full ROM all peripheral extremities, joint stability, 5/5 strength, and normal gait.  Skin: Warm, dry without exposed rashes, lesions or ecchymosis apparent.  Neuro: Cranial nerves intact, reflexes equal bilaterally. Sensory-motor testing grossly intact. Tendon reflexes grossly intact.  Pysch: Alert & oriented x 3.  Insight and judgement nl & appropriate. No ideations.  Assessment and Plan:   1. Essential hypertension  - Continue medication, monitor blood pressure at home.  - Continue DASH diet. Reminder to go to the ER if any CP,  SOB, nausea, dizziness, severe HA, changes vision/speech,  left arm numbness and tingling and jaw pain.  - CBC with Differential/Platelet - BASIC METABOLIC PANEL WITH GFR - Magnesium - TSH  2. Mixed hyperlipidemia  - Continue diet/meds, exercise,& lifestyle modifications.  - Continue monitor periodic cholesterol/liver & renal functions   - Hepatic function panel - Lipid panel - TSH  3. Pre-diabetes  - Hemoglobin A1c - Insulin, random  4. Vitamin D deficiency  - Continue diet, exercise, lifestyle modifications.  - Monitor appropriate labs. - Continue supplementation - VITAMIN D 25 Hydroxy  5. Testosterone Deficiency  - Testosterone  6. Medication management  - CBC with Differential/Platelet - BASIC METABOLIC PANEL WITH GFR - Hepatic function panel - Magnesium - Lipid panel - TSH - Hemoglobin A1c - Insulin, random - VITAMIN D 25 Hydroxy - Testosterone       Recommended regular exercise, BP monitoring, weight control, and discussed med and SE's. Recommended labs to assess and monitor clinical status. Further disposition pending results of labs. Over 30 minutes of exam, counseling, chart review was  performed

## 2016-06-23 LAB — HEMOGLOBIN A1C
Hgb A1c MFr Bld: 5.4 % (ref <5.7)
Mean Plasma Glucose: 108 mg/dL

## 2016-06-23 LAB — INSULIN, RANDOM: Insulin: 14.7 u[IU]/mL (ref 2.0–19.6)

## 2016-06-23 LAB — TESTOSTERONE: Testosterone: 643 ng/dL (ref 250–827)

## 2016-06-23 LAB — VITAMIN D 25 HYDROXY (VIT D DEFICIENCY, FRACTURES): Vit D, 25-Hydroxy: 71 ng/mL (ref 30–100)

## 2016-07-21 ENCOUNTER — Other Ambulatory Visit: Payer: Self-pay | Admitting: Internal Medicine

## 2016-08-14 ENCOUNTER — Other Ambulatory Visit: Payer: Self-pay | Admitting: Internal Medicine

## 2016-08-18 ENCOUNTER — Encounter: Payer: Self-pay | Admitting: *Deleted

## 2016-08-27 ENCOUNTER — Encounter: Payer: Self-pay | Admitting: *Deleted

## 2016-09-23 ENCOUNTER — Ambulatory Visit: Payer: Self-pay | Admitting: Internal Medicine

## 2016-10-09 ENCOUNTER — Other Ambulatory Visit: Payer: Self-pay | Admitting: Internal Medicine

## 2016-10-14 NOTE — Progress Notes (Signed)
Assessment and Plan:   Essential hypertension - continue medications, DASH diet, exercise and monitor at home. Call if greater than 130/80.  -     CBC with Differential/Platelet -     BASIC METABOLIC PANEL WITH GFR -     Hepatic function panel -     TSH  Mixed hyperlipidemia -continue medications, check lipids, decrease fatty foods, increase activity.  -     Lipid panel  Testosterone Deficiency Hypogonadism- continue replacement therapy, check testosterone levels as needed.   Medication management -     Magnesium   Continue diet and meds as discussed. Further disposition pending results of labs. Future Appointments Date Time Provider [place]  01/12/2017 9:00 AM Unk [name], MD [facility] None    HPI 61 y.o. male  presents for 3 month follow up with hypertension, hyperlipidemia, prediabetes and vitamin D. His blood pressure has been controlled at home, today their BP is BP: 120/80 He does workout, strained right rotator cuff, doing PT, denies chest pain, shortness of breath, dizziness.  He is on crestor 40 1/2 pill QD and denies myalgias. His cholesterol is at goal. The cholesterol last visit was:   Lab Results  Component Value Date   CHOL 125 06/22/2016   HDL 47 06/22/2016   LDLCALC 52 06/22/2016   TRIG 128 06/22/2016   CHOLHDL 2.7 06/22/2016  He has been working on diet and exercise for prediabetes, and denies paresthesia of the feet, polydipsia, polyuria and visual disturbances. Last A1C in the office was:  Lab Results  Component Value Date   HGBA1C 5.4 06/22/2016  Patient is on Vitamin D supplement.   Lab Results  Component Value Date   VD25OH 34 06/22/2016  He has a history of testosterone deficiency and is on testosterone replacement, on fortesta 4 pumps a day and feels better. He states that the testosterone helps with his energy, libido, muscle mass. Lab Results  Component Value Date   TESTOSTERONE 643 06/22/2016     Current Medications:   Current Outpatient Prescriptions on File Prior to Visit  Medication Sig  . ALPRAZolam (XANAX) 0.5 MG tablet TAKE 1/2 TO 1 TABLET 3 TIMES DAILY AS NEEDED FOR ANXIETY  . Ascorbic Acid (VITAMIN C) 1000 MG tablet Take 1,000 mg by mouth 2 (two) times daily.  . Cholecalciferol (VITAMIN D-3) 5000 UNITS TABS Take 2,000 Units by mouth daily.   Marland Kitchen CIALIS 5 MG tablet TAKE 1 TABLET DAILY FOR    PROSTATISM  . clobetasol cream (TEMOVATE) 0.05 % APPLY TO RASH TWICE DAILY AS NEEDED  . cyclobenzaprine (FLEXERIL) 10 MG tablet Take 10 mg by mouth as needed for muscle spasms.  Marland Kitchen dicyclomine (BENTYL) 20 MG tablet TAKE 1 TABLET 3 TO 4 X/ DAY AS NEEDED FOR IBS  . Flaxseed, Linseed, (FLAX SEED OIL PO) Take 1,000 mg by mouth 3 (three) times daily.   . Glucosamine-Chondroitin (GLUCOSAMINE CHONDR COMPLEX PO) Take by mouth. Take 2 tablets daily  . isometheptene-acetaminophen-dichloralphenazone (MIDRIN) 65-100-325 MG capsule TAKE 1 TO 2 CAPSULES BY MOUTH EVERY 4 HOURS AS NEEDED FOR HEADACHE  . linaclotide (LINZESS) 72 MCG capsule Take 1 capsule (72 mcg total) by mouth daily before breakfast.  . Magnesium 500 MG TABS Take 2 tablets by mouth daily.  . Melatonin 10 MG CAPS Take by mouth Nightly.  . meloxicam (MOBIC) 15 MG tablet Take 1 tablet (15 mg total) by mouth as needed for pain.  . moexipril (UNIVASC) 15 MG tablet TAKE 1 TABLET DAILY FOR  BLOOD PRESSURE  . Omega-3 Fatty Acids (FISH OIL PO) Take 1,000 mg by mouth 3 (three) times daily.   Dan Europe XSAIL 11 MG/NOSEPC EXHP UAD   . pregabalin (LYRICA) 200 MG capsule Take 200 mg by mouth 2 (two) times daily.  . Probiotic Product (ALIGN) 4 MG CAPS Take 1 capsule daily for Probiotic Benefit  . rosuvastatin (CRESTOR) 40 MG tablet TAKE 1 TABLET DAILY  . TAZORAC 0.05 % cream as needed.   . Testosterone (FORTESTA) 10 MG/ACT (2%) GEL Apply 4 pumps total daily to thighs daily.  . vitamin B-12 (CYANOCOBALAMIN) 100 MCG tablet Take 100 mcg by mouth daily.  Marland Kitchen zolmitriptan (ZOMIG)  5 MG tablet as needed.    No current facility-administered medications on file prior to visit.     Medical History:  Past Medical History:  Diagnosis Date  . Hyperlipidemia   . Hypertension   . Pre-diabetes    no meds  . Vitamin D deficiency    Allergies: Not on File   Review of Systems:  Review of Systems  Constitutional: Negative.  Negative for malaise/fatigue.  HENT: Negative.   Eyes: Negative.   Respiratory: Negative for shortness of breath.   Cardiovascular: Negative.  Negative for chest pain.  Gastrointestinal: Negative.   Genitourinary: Negative.   Musculoskeletal: Negative.   Skin: Negative.   Neurological: Negative.   Endo/Heme/Allergies: Negative.   Psychiatric/Behavioral: Negative.     Family history- Review and unchanged Social history- Review and unchanged Physical Exam: BP 120/80   Pulse (!) 58   Temp (!) 97.5 F (36.4 C)   Resp 14   Ht 5' 8.25" (1.734 m)   Wt 193 lb (87.5 kg)   SpO2 98%   BMI 29.13 kg/m  Wt Readings from Last 3 Encounters:  10/15/16 193 lb (87.5 kg)  06/22/16 198 lb 3.2 oz (89.9 kg)  04/02/16 185 lb (83.9 kg)   General Appearance: Well nourished, in no apparent distress. Eyes: PERRLA, EOMs, conjunctiva no swelling or erythema Sinuses: No Frontal/maxillary tenderness ENT/Mouth: Ext aud canals clear, TMs without erythema, bulging. No erythema, swelling, or exudate on post pharynx.  Tonsils not swollen or erythematous. Hearing normal.  Neck: Supple, thyroid normal.  Respiratory: Respiratory effort normal, BS equal bilaterally without rales, rhonchi, wheezing or stridor.  Cardio: sinus brady with no MRGs. Brisk peripheral pulses without edema.  Abdomen: Soft, + BS.  Non tender, no guarding, rebound, hernias, masses. Lymphatics: Non tender without lymphadenopathy.  Musculoskeletal: Full ROM, 5/5 strength, normal gait.  Skin: Warm, dry without rashes, lesions, ecchymosis.  Neuro: Cranial nerves intact. Normal muscle tone, no  cerebellar symptoms. Sensation intact.  Psych: Awake and oriented X 3, normal affect, Insight and Judgment appropriate.    Vicie Mutters, PA-C 11:03 AM [facility]

## 2016-10-15 ENCOUNTER — Ambulatory Visit (INDEPENDENT_AMBULATORY_CARE_PROVIDER_SITE_OTHER): Payer: 59 | Admitting: Physician Assistant

## 2016-10-15 ENCOUNTER — Encounter: Payer: Self-pay | Admitting: Physician Assistant

## 2016-10-15 VITALS — BP 120/80 | HR 58 | Temp 97.5°F | Resp 14 | Ht 68.25 in | Wt 193.0 lb

## 2016-10-15 DIAGNOSIS — E291 Testicular hypofunction: Secondary | ICD-10-CM | POA: Diagnosis not present

## 2016-10-15 DIAGNOSIS — Z79899 Other long term (current) drug therapy: Secondary | ICD-10-CM | POA: Diagnosis not present

## 2016-10-15 DIAGNOSIS — I10 Essential (primary) hypertension: Secondary | ICD-10-CM | POA: Diagnosis not present

## 2016-10-15 DIAGNOSIS — E782 Mixed hyperlipidemia: Secondary | ICD-10-CM | POA: Diagnosis not present

## 2016-10-15 LAB — LIPID PANEL
Cholesterol: 149 mg/dL (ref <200)
HDL: 49 mg/dL (ref >40)
LDL Cholesterol: 71 mg/dL (ref <100)
Total CHOL/HDL Ratio: 3 Ratio (ref <5.0)
Triglycerides: 145 mg/dL (ref <150)
VLDL: 29 mg/dL (ref <30)

## 2016-10-15 LAB — BASIC METABOLIC PANEL WITH GFR
BUN: 13 mg/dL (ref 7–25)
CO2: 26 mmol/L (ref 20–31)
Calcium: 9.2 mg/dL (ref 8.6–10.3)
Chloride: 104 mmol/L (ref 98–110)
Creat: 1.02 mg/dL (ref 0.70–1.25)
GFR, Est African American: >89 mL/min (ref >=60)
GFR, Est Non African American: 80 mL/min (ref >=60)
Glucose, Bld: 82 mg/dL (ref 65–99)
Potassium: 4.3 mmol/L (ref 3.5–5.3)
Sodium: 138 mmol/L (ref 135–146)

## 2016-10-15 LAB — CBC WITH DIFFERENTIAL/PLATELET
Basophils Absolute: 47 cells/uL (ref 0–200)
Basophils Relative: 1 %
Eosinophils Absolute: 282 cells/uL (ref 15–500)
Eosinophils Relative: 6 %
HCT: 42 % (ref 38.5–50.0)
Hemoglobin: 13.5 g/dL (ref 13.2–17.1)
Lymphocytes Relative: 28 %
Lymphs Abs: 1316 cells/uL (ref 850–3900)
MCH: 27.6 pg (ref 27.0–33.0)
MCHC: 32.1 g/dL (ref 32.0–36.0)
MCV: 85.7 fL (ref 80.0–100.0)
MPV: 9 fL (ref 7.5–12.5)
Monocytes Absolute: 611 cells/uL (ref 200–950)
Monocytes Relative: 13 %
Neutro Abs: 2444 cells/uL (ref 1500–7800)
Neutrophils Relative %: 52 %
Platelets: 285 10*3/uL (ref 140–400)
RBC: 4.9 MIL/uL (ref 4.20–5.80)
RDW: 14.6 % (ref 11.0–15.0)
WBC: 4.7 10*3/uL (ref 3.8–10.8)

## 2016-10-15 LAB — TSH: TSH: 1.08 mIU/L (ref 0.40–4.50)

## 2016-10-15 LAB — HEPATIC FUNCTION PANEL
ALT: 29 U/L (ref 9–46)
AST: 34 U/L (ref 10–35)
Albumin: 4.4 g/dL (ref 3.6–5.1)
Alkaline Phosphatase: 70 U/L (ref 40–115)
Bilirubin, Direct: 0.1 mg/dL (ref <=0.2)
Indirect Bilirubin: 0.3 mg/dL (ref 0.2–1.2)
Total Bilirubin: 0.4 mg/dL (ref 0.2–1.2)
Total Protein: 6.6 g/dL (ref 6.1–8.1)

## 2016-10-15 LAB — MAGNESIUM: Magnesium: 2.1 mg/dL (ref 1.5–2.5)

## 2016-10-15 NOTE — Patient Instructions (Signed)
Muscle aches We will check your potassium, magnesium to see if these are low which can cause muscle aches.  Please make sure you are taking 64 oz of water a day as long as you do now have a heart condition.  -Please take Tylenol or Aleve for pain. -You can take tylenol (500mg ) or tylenol arthritis (650mg ). The max you can take of tylenol a day is 3000mg  daily, this is a max of 6 pills a day of the regular tyelnol (500mg ) or a max of 4 a day of the tylenol arthritis (650mg ) as long as no other medications you are taking contain tylenol.  -Aleve/Naproxen Sodium- can take 1-2 in the morning and 1 at night. Max 3 a day. Take with food to avoid ulcers. Does affect your kidney function so use sparingly.   If you have had tick exposure, we can check for lyme disease or [place] spotted fever.   Muscle Pain Muscle pain (myalgia) may be caused by many things, including:  Overuse or muscle strain, especially if you are not in shape. This is the most common cause of muscle pain.  Injury.  Bruises.  Viruses, such as the flu.  Infectious diseases.  Fibromyalgia, which is a chronic condition that causes muscle tenderness, fatigue, and headache.  Autoimmune diseases, including lupus.  Certain drugs, including ACE inhibitors and statins. Muscle pain may be mild or severe. In most cases, the pain lasts only a short time and goes away without treatment. To diagnose the cause of your muscle pain, your health care provider will take your medical history. This means he or she will ask you when your muscle pain began and what has been happening. If you have not had muscle pain for very long, your health care provider may want to wait before doing much testing. If your muscle pain has lasted a long time, your health care provider may want to run tests right away. If your health care provider thinks your muscle pain may be caused by illness, you may need to have additional tests to rule out certain  conditions.  Treatment for muscle pain depends on the cause. Home care is often enough to relieve muscle pain. Your health care provider may also prescribe anti-inflammatory medicine. HOME CARE INSTRUCTIONS Watch your condition for any changes. The following actions may help to lessen any discomfort you are feeling:  Only take over-the-counter or prescription medicines as directed by your health care provider.  Apply ice to the sore muscle:  Put ice in a plastic bag.  Place a towel between your skin and the bag.  Leave the ice on for 15-20 minutes, 3-4 times a day.  You may alternate applying hot and cold packs to the muscle as directed by your health care provider.  If overuse is causing your muscle pain, slow down your activities until the pain goes away.  Remember that it is normal to feel some muscle pain after starting a workout program. Muscles that have not been used often will be sore at first.  Do regular, gentle exercises if you are not usually active.  Warm up before exercising to lower your risk of muscle pain.  Do not continue working out if the pain is very bad. Bad pain could mean you have injured a muscle. SEEK MEDICAL CARE IF:  Your muscle pain gets worse, and medicines do not help.  You have muscle pain that lasts longer than 3 days.  You have a rash or fever along with  muscle pain.  You have muscle pain after a tick bite.  You have muscle pain while working out, even though you are in good physical condition.  You have redness, soreness, or swelling along with muscle pain.  You have muscle pain after starting a new medicine or changing the dose of a medicine. SEEK IMMEDIATE MEDICAL CARE IF:  You have trouble breathing.  You have trouble swallowing.  You have muscle pain along with a stiff neck, fever, and vomiting.  You have severe muscle weakness or cannot move part of your body. MAKE SURE YOU:   Understand these instructions.  Will watch your  condition.  Will get help right away if you are not doing well or get worse. Document Released: 02/12/2006 Document Revised: 03/28/2013 Document Reviewed: 01/17/2013 [facility] Patient Information 2015 [place], [place]. This information is not intended to replace advice given to you by your health care provider. Make sure you discuss any questions you have with your health care provider.

## 2016-10-20 ENCOUNTER — Encounter: Payer: Self-pay | Admitting: Physician Assistant

## 2016-11-06 ENCOUNTER — Other Ambulatory Visit: Payer: Self-pay | Admitting: *Deleted

## 2016-11-06 DIAGNOSIS — E349 Endocrine disorder, unspecified: Secondary | ICD-10-CM

## 2016-11-06 MED ORDER — TESTOSTERONE 10 MG/ACT (2%) TD GEL: TRANSDERMAL | 1 refills | Status: DC

## 2016-11-19 ENCOUNTER — Other Ambulatory Visit: Payer: Self-pay | Admitting: Internal Medicine

## 2017-01-12 ENCOUNTER — Encounter: Payer: Self-pay | Admitting: Internal Medicine

## 2017-01-12 ENCOUNTER — Ambulatory Visit (INDEPENDENT_AMBULATORY_CARE_PROVIDER_SITE_OTHER): Payer: 59 | Admitting: Internal Medicine

## 2017-01-12 VITALS — BP 108/72 | HR 56 | Temp 97.2°F | Resp 18 | Ht 68.0 in | Wt 194.1 lb

## 2017-01-12 DIAGNOSIS — G43009 Migraine without aura, not intractable, without status migrainosus: Secondary | ICD-10-CM

## 2017-01-12 DIAGNOSIS — Z136 Encounter for screening for cardiovascular disorders: Secondary | ICD-10-CM

## 2017-01-12 DIAGNOSIS — I10 Essential (primary) hypertension: Secondary | ICD-10-CM

## 2017-01-12 DIAGNOSIS — E782 Mixed hyperlipidemia: Secondary | ICD-10-CM

## 2017-01-12 DIAGNOSIS — Z111 Encounter for screening for respiratory tuberculosis: Secondary | ICD-10-CM | POA: Diagnosis not present

## 2017-01-12 DIAGNOSIS — E349 Endocrine disorder, unspecified: Secondary | ICD-10-CM

## 2017-01-12 DIAGNOSIS — Z1212 Encounter for screening for malignant neoplasm of rectum: Secondary | ICD-10-CM

## 2017-01-12 DIAGNOSIS — R7303 Prediabetes: Secondary | ICD-10-CM

## 2017-01-12 DIAGNOSIS — Z0000 Encounter for general adult medical examination without abnormal findings: Secondary | ICD-10-CM

## 2017-01-12 DIAGNOSIS — Z1211 Encounter for screening for malignant neoplasm of colon: Secondary | ICD-10-CM

## 2017-01-12 DIAGNOSIS — R5383 Other fatigue: Secondary | ICD-10-CM

## 2017-01-12 DIAGNOSIS — Z79899 Other long term (current) drug therapy: Secondary | ICD-10-CM

## 2017-01-12 DIAGNOSIS — Z0001 Encounter for general adult medical examination with abnormal findings: Secondary | ICD-10-CM

## 2017-01-12 DIAGNOSIS — Z125 Encounter for screening for malignant neoplasm of prostate: Secondary | ICD-10-CM

## 2017-01-12 NOTE — Patient Instructions (Signed)

## 2017-01-12 NOTE — Progress Notes (Signed)
[facility]   Unk [name], M.D.     Uvaldo Bristle. Silverio Lay, P.A.-C Liane Comber, [place]                [address] [place], [place] [address] Telephone [phone] Telefax [phone] Annual  Screening/Preventative Visit  & Comprehensive Evaluation & Examination     This very nice 61 y.o.  [name] presents for a Screening/Preventative Visit & comprehensive evaluation and management of multiple medical co-morbidities.  Patient has been followed for HTN, Prediabetes, Hyperlipidemia and Vitamin D Deficiency.     HTN predates since 1999. Patient's BP has been controlled at home. Patient has CKD3 consequent of his HTN.   In 2008, he had a negative Heart Cath following a false positive Cardiolite. Today's BP is at goal - 108/72. Patient denies any cardiac symptoms as chest pain, palpitations, shortness of breath, dizziness or ankle swelling.     Patient's hyperlipidemia is controlled with diet and medications. Patient denies myalgias or other medication SE's. Last lipids were at goal: Lab Results  Component Value Date   CHOL 153 01/12/2017   HDL 55 01/12/2017   LDLCALC 71 10/15/2016   TRIG 115 01/12/2017   CHOLHDL 2.8 01/12/2017      Patient has prediabetes (A1c  5.7% /2011 and then 5.4% in 2014). Patient denies reactive hypoglycemic symptoms, visual blurring, diabetic polys or paresthesias. Last A1c was at goal: Lab Results  Component Value Date   HGBA1C 5.4 06/22/2016       Patient has hx/o Low T & has been on replacement since 2005 with improved energy, stamina and sense of well-being. Finally, patient has history of Vitamin D Deficiency of    and last vitamin D was  Lab Results  Component Value Date   VD25OH 71 06/22/2016    NKA  Past Medical History:  Diagnosis Date  . Hyperlipidemia   . Hypertension   . Pre-diabetes    no meds  . Vitamin D deficiency    Health Maintenance  Topic  Date Due  . Hepatitis C Screening  06-19-1955  . HIV Screening  05/11/1971  . INFLUENZA VACCINE  11/04/2016  . TETANUS/TDAP  06/16/2018  . COLONOSCOPY  08/01/2024   Immunization History  Administered Date(s) Administered  . Influenza Split 01/08/2014  . Influenza Whole 01/05/2013  . Influenza-Unspecified 01/05/2015  . PPD Test 01/08/2014, 01/14/2015, 12/12/2015, 01/12/2017  . Tdap 06/15/2008   Past Surgical History:  Procedure Laterality Date  . ESOPHAGOGASTRODUODENOSCOPY    . KNEE SURGERY     left  . ORIF ACETABULAR FRACTURE     left arm 2 times   Family History  Problem Relation Age of Onset  . Hypertension Mother   . Hyperlipidemia Mother   . Diabetes Mother   . Hypertension Father    Social History   Social History  . Marital status: Married    Spouse name: N/A  . Number of children: N/A  . Years of education: N/A   Occupational History  . Not on file.   Social History Main Topics  . Smoking status: Never Smoker  . Smokeless tobacco: Never Used  . Alcohol use 1.2 - 3.0 oz/week    2 - 5 Standard drinks or equivalent per week  . Drug use: No  . Sexual activity: Not on file   Other Topics Concern  . Not on  file   Social History Narrative  . No narrative on file    ROS Constitutional: Denies fever, chills, weight loss/gain, headaches, insomnia,  night sweats or change in appetite. Does c/o fatigue. Eyes: Denies redness, blurred vision, diplopia, discharge, itchy or watery eyes.  ENT: Denies discharge, congestion, post nasal drip, epistaxis, sore throat, earache, hearing loss, dental pain, Tinnitus, Vertigo, Sinus pain or snoring.  Cardio: Denies chest pain, palpitations, irregular heartbeat, syncope, dyspnea, diaphoresis, orthopnea, PND, claudication or edema Respiratory: denies cough, dyspnea, DOE, pleurisy, hoarseness, laryngitis or wheezing.  Gastrointestinal: Denies dysphagia, heartburn, reflux, water brash, pain, cramps, nausea, vomiting, bloating,  diarrhea, constipation, hematemesis, melena, hematochezia, jaundice or hemorrhoids Genitourinary: Denies dysuria, frequency, urgency, nocturia, hesitancy, discharge, hematuria or flank pain Musculoskeletal: Denies arthralgia, myalgia, stiffness, Jt. Swelling, pain, limp or strain/sprain. Denies Falls. Skin: Denies puritis, rash, hives, warts, acne, eczema or change in skin lesion Neuro: No weakness, tremor, incoordination, spasms, paresthesia or pain Psychiatric: Denies confusion, memory loss or sensory loss. Denies Depression. Endocrine: Denies change in weight, skin, hair change, nocturia, and paresthesia, diabetic polys, visual blurring or hyper / hypo glycemic episodes.  Heme/Lymph: No excessive bleeding, bruising or enlarged lymph nodes.  Physical Exam  BP 108/72   Pulse (!) 56   Temp (!) 97.2 F (36.2 C)   Resp 18   Ht 5\' 8"  (1.727 m)   Wt 194 lb 1.6 oz (88 kg)   BMI 29.51 kg/m   General Appearance: Well nourished and well groomed and in no apparent distress.  Eyes: PERRLA, EOMs, conjunctiva no swelling or erythema, normal fundi and vessels. Sinuses: No frontal/maxillary tenderness ENT/Mouth: EACs patent / TMs  nl. Nares clear without erythema, swelling, mucoid exudates. Oral hygiene is good. No erythema, swelling, or exudate. Tongue normal, non-obstructing. Tonsils not swollen or erythematous. Hearing normal.  Neck: Supple, thyroid normal. No bruits, nodes or JVD. Respiratory: Respiratory effort normal.  BS equal and clear bilateral without rales, rhonci, wheezing or stridor. Cardio: Heart sounds are normal with regular rate and rhythm and no murmurs, rubs or gallops. Peripheral pulses are normal and equal bilaterally without edema. No aortic or femoral bruits. Chest: symmetric with normal excursions and percussion.  Abdomen: Soft, with Nl bowel sounds. Nontender, no guarding, rebound, hernias, masses, or organomegaly.  Lymphatics: Non tender without lymphadenopathy.   Genitourinary: No hernias.Testes nl. DRE - prostate nl for age - smooth & firm w/o nodules. Musculoskeletal: Full ROM all peripheral extremities, joint stability, 5/5 strength, and normal gait. Skin: Warm and dry without rashes, lesions, cyanosis, clubbing or  ecchymosis.  Neuro: Cranial nerves intact, reflexes equal bilaterally. Normal muscle tone, no cerebellar symptoms. Sensation intact.  Pysch: Alert and oriented X 3 with normal affect, insight and judgment appropriate.   Assessment and Plan  1. Annual Preventative/Screening Exam   2. Essential hypertension  - EKG 12-Lead - [place], RETROPERITNL ABD,  LTD - Urinalysis, Routine w reflex microscopic - Microalbumin / creatinine urine ratio - CBC with Differential/Platelet - BASIC METABOLIC PANEL WITH GFR - Magnesium - TSH  3. Hyperlipidemia, mixed  - EKG 12-Lead - [place], RETROPERITNL ABD,  LTD - Hepatic function panel - Lipid panel - TSH  4. Pre-diabetes  - EKG 12-Lead - [place], RETROPERITNL ABD,  LTD - Hemoglobin A1c - Insulin, random  5. Testosterone deficiency  - VITAMIN D 25 Hydroxy  6. Migraine    7. Screening for colorectal cancer  - POC Hemoccult Bld/Stl   8. Prostate cancer screening  - PSA  9. Screening for  ischemic heart disease  - EKG 12-Lead  10. Screening for AAA (aortic abdominal aneurysm)  - [place], RETROPERITNL ABD,  LTD  11. Screening examination for pulmonary tuberculosis  - PPD  12. Fatigue  - Iron,Total/Total Iron Binding Cap - Vitamin B12 - CBC with Differential/Platelet - TSH  13. Medication management  - Urinalysis, Routine w reflex microscopic - Microalbumin / creatinine urine ratio - CBC with Differential/Platelet - BASIC METABOLIC PANEL WITH GFR - Hepatic function panel - Magnesium - Lipid panel - TSH - Hemoglobin A1c - Insulin, random - VITAMIN D 25 Hydroxy         Patient was counseled in prudent diet, weight control to achieve/maintain BMI less than 25, BP  monitoring, regular exercise and medications as discussed.  Discussed med effects and SE's. Routine screening labs and tests as requested with regular follow-up as recommended. Over 40 minutes of exam, counseling, chart review and high complex critical decision making was performed

## 2017-01-13 LAB — CBC WITH DIFFERENTIAL/PLATELET
Basophils Absolute: 93 cells/uL (ref 0–200)
Basophils Relative: 1.5 %
Eosinophils Absolute: 694 cells/uL — ABNORMAL HIGH (ref 15–500)
Eosinophils Relative: 11.2 %
HCT: 45.4 % (ref 38.5–50.0)
Hemoglobin: 14.7 g/dL (ref 13.2–17.1)
Lymphs Abs: 1643 cells/uL (ref 850–3900)
MCH: 27.1 pg (ref 27.0–33.0)
MCHC: 32.4 g/dL (ref 32.0–36.0)
MCV: 83.6 fL (ref 80.0–100.0)
MPV: 10.1 fL (ref 7.5–12.5)
Monocytes Relative: 10.2 %
Neutro Abs: 3137 cells/uL (ref 1500–7800)
Neutrophils Relative %: 50.6 %
Platelets: 289 10*3/uL (ref 140–400)
RBC: 5.43 10*6/uL (ref 4.20–5.80)
RDW: 14.2 % (ref 11.0–15.0)
Total Lymphocyte: 26.5 %
WBC mixed population: 632 cells/uL (ref 200–950)
WBC: 6.2 10*3/uL (ref 3.8–10.8)

## 2017-01-13 LAB — TSH: TSH: 0.98 mIU/L (ref 0.40–4.50)

## 2017-01-13 LAB — HEPATIC FUNCTION PANEL
AG Ratio: 1.9 (calc) (ref 1.0–2.5)
ALT: 31 U/L (ref 9–46)
AST: 37 U/L — ABNORMAL HIGH (ref 10–35)
Albumin: 4.5 g/dL (ref 3.6–5.1)
Alkaline phosphatase (APISO): 79 U/L (ref 40–115)
Bilirubin, Direct: 0.1 mg/dL (ref 0.0–0.2)
Globulin: 2.4 g/dL (calc) (ref 1.9–3.7)
Indirect Bilirubin: 0.3 mg/dL (calc) (ref 0.2–1.2)
Total Bilirubin: 0.4 mg/dL (ref 0.2–1.2)
Total Protein: 6.9 g/dL (ref 6.1–8.1)

## 2017-01-13 LAB — LIPID PANEL
Cholesterol: 153 mg/dL (ref <200)
HDL: 55 mg/dL (ref >40)
LDL Cholesterol (Calc): 78 mg/dL (calc)
Non-HDL Cholesterol (Calc): 98 mg/dL (calc) (ref <130)
Total CHOL/HDL Ratio: 2.8 (calc) (ref <5.0)
Triglycerides: 115 mg/dL (ref <150)

## 2017-01-13 LAB — BASIC METABOLIC PANEL WITH GFR
BUN: 21 mg/dL (ref 7–25)
CO2: 27 mmol/L (ref 20–32)
Calcium: 9.7 mg/dL (ref 8.6–10.3)
Chloride: 105 mmol/L (ref 98–110)
Creat: 1.12 mg/dL (ref 0.70–1.25)
GFR, Est African American: 82 mL/min/{1.73_m2} (ref >=60)
GFR, Est Non African American: 71 mL/min/{1.73_m2} (ref >=60)
Glucose, Bld: 68 mg/dL (ref 65–99)
Potassium: 4.5 mmol/L (ref 3.5–5.3)
Sodium: 140 mmol/L (ref 135–146)

## 2017-01-13 LAB — IRON, TOTAL/TOTAL IRON BINDING CAP
%SAT: 12 % (calc) — ABNORMAL LOW (ref 15–60)
Iron: 53 ug/dL (ref 50–180)
TIBC: 452 mcg/dL (calc) — ABNORMAL HIGH (ref 250–425)

## 2017-01-13 LAB — MICROALBUMIN / CREATININE URINE RATIO
Creatinine, Urine: 127 mg/dL (ref 20–320)
Microalb Creat Ratio: 28 mcg/mg creat (ref <30)
Microalb, Ur: 3.5 mg/dL

## 2017-01-13 LAB — URINALYSIS, ROUTINE W REFLEX MICROSCOPIC
Bilirubin Urine: NEGATIVE
Glucose, UA: NEGATIVE
Hgb urine dipstick: NEGATIVE
Ketones, ur: NEGATIVE
Leukocytes, UA: NEGATIVE
Nitrite: NEGATIVE
Protein, ur: NEGATIVE
Specific Gravity, Urine: 1.021 (ref 1.001–1.03)
pH: 6 (ref 5.0–8.0)

## 2017-01-13 LAB — INSULIN, RANDOM: Insulin: 6.1 u[IU]/mL (ref 2.0–19.6)

## 2017-01-13 LAB — HEMOGLOBIN A1C
Hgb A1c MFr Bld: 5.5 % of total Hgb (ref <5.7)
Mean Plasma Glucose: 111 (calc)
eAG (mmol/L): 6.2 (calc)

## 2017-01-13 LAB — VITAMIN B12: Vitamin B-12: 673 pg/mL (ref 200–1100)

## 2017-01-13 LAB — MAGNESIUM: Magnesium: 2.2 mg/dL (ref 1.5–2.5)

## 2017-01-13 LAB — PSA: PSA: 3.5 ng/mL (ref <=4.0)

## 2017-01-13 LAB — VITAMIN D 25 HYDROXY (VIT D DEFICIENCY, FRACTURES): Vit D, 25-Hydroxy: 62 ng/mL (ref 30–100)

## 2017-01-28 ENCOUNTER — Other Ambulatory Visit: Payer: Self-pay

## 2017-01-28 DIAGNOSIS — Z1211 Encounter for screening for malignant neoplasm of colon: Secondary | ICD-10-CM

## 2017-01-28 DIAGNOSIS — Z1212 Encounter for screening for malignant neoplasm of rectum: Secondary | ICD-10-CM | POA: Diagnosis not present

## 2017-01-28 LAB — POC HEMOCCULT BLD/STL (HOME/3-CARD/SCREEN)
Card #2 Fecal Occult Blod, POC: NEGATIVE
Card #3 Fecal Occult Blood, POC: NEGATIVE
Fecal Occult Blood, POC: NEGATIVE

## 2017-02-05 LAB — TB SKIN TEST
Induration: 0 mm
TB Skin Test: NEGATIVE

## 2017-02-09 ENCOUNTER — Other Ambulatory Visit: Payer: Self-pay | Admitting: Internal Medicine

## 2017-02-19 ENCOUNTER — Other Ambulatory Visit: Payer: Self-pay

## 2017-02-19 MED ORDER — BUTALBITAL-APAP-CAFFEINE 50-325-40 MG PO TABS: ORAL_TABLET | ORAL | 0 refills | Status: DC

## 2017-03-25 ENCOUNTER — Other Ambulatory Visit: Payer: Self-pay | Admitting: Internal Medicine

## 2017-03-29 ENCOUNTER — Other Ambulatory Visit: Payer: Self-pay | Admitting: Internal Medicine

## 2017-04-14 NOTE — Progress Notes (Signed)
FOLLOW UP  Assessment and Plan:   Hypertension Well controlled with current medications  Monitor blood pressure at home; patient to call if consistently greater than 130/80 Continue DASH diet.   Reminder to go to the ER if any CP, SOB, nausea, dizziness, severe HA, changes vision/speech, left arm numbness and tingling and jaw pain.  Cholesterol Currently at goal; continue statin Continue low cholesterol diet and exercise.  Check lipid panel.   Other abnormal glucose Continue diet and exercise, weight management Perform daily foot/skin check, notify office of any concerning changes.  Check A1C  Overweight Long discussion about weight loss, diet, and exercise Recommended diet heavy in fruits and veggies and low in animal meats, cheeses, and dairy products, appropriate calorie intake Patient will work on increasing exercise; improved choices, cutting down on sweets after the holiday Will follow up in 3 months  Vitamin D Def Near goal at last visit; continue supplementation to maintain goal of 70-100 Defer Vit D level  Continue diet and meds as discussed. Further disposition pending results of labs. Discussed med's effects and SE's.   Over 30 minutes of exam, counseling, chart review, and critical decision making was performed.   Future Appointments  Date Time Provider [place]  07/19/2017  3:30 PM Unk [name], MD [facility] None  02/08/2018  9:00 AM Unk [name], MD [facility] None    ----------------------------------------------------------------------------------------------------------------------  HPI 62 y.o. male  presents for 3 month follow up on hypertension, cholesterol, glucose management, weight and vitamin D deficiency.   BMI is Body mass index is 29.95 kg/m., he has been working on diet and exercise. Wt Readings from Last 3 Encounters:  04/15/17 197 lb (89.4 kg)  01/12/17 194 lb 1.6 oz (88 kg)  10/15/16 193 lb (87.5 kg)   His blood  pressure has been controlled at home, today their BP is BP: 122/82  He does workout. He denies chest pain, shortness of breath, dizziness.   He is on cholesterol medication and denies myalgias. His cholesterol is at goal. The cholesterol last visit was:   Lab Results  Component Value Date   CHOL 153 01/12/2017   HDL 55 01/12/2017   LDLCALC 71 10/15/2016   TRIG 115 01/12/2017   CHOLHDL 2.8 01/12/2017    He has been working on diet and exercise for hx of prediabetes recently well controlled, and denies increased appetite, nausea, paresthesia of the feet, polydipsia, polyuria, visual disturbances and vomiting. Last A1C in the office was:  Lab Results  Component Value Date   HGBA1C 5.5 01/12/2017   Patient is on Vitamin D supplement and near goal:   Lab Results  Component Value Date   VD25OH 62 01/12/2017        Current Medications:  Current Outpatient Medications on File Prior to Visit  Medication Sig  . ALPRAZolam (XANAX) 0.5 MG tablet Take 1/2 to 1 tablet 2 to 3 x / day Only if needed for Anxiety Attack and please try to limit to 5 days /week to avoid addiction  . Ascorbic Acid (VITAMIN C) 1000 MG tablet Take 1,000 mg by mouth 2 (two) times daily.  . butalbital-acetaminophen-caffeine (FIORICET, ESGIC) 50-325-40 MG tablet TAKE 1 TABLET EVERY 4 HOURS AS NEEDED FOR HEADACHE  . Cholecalciferol (VITAMIN D-3) 5000 UNITS TABS Take 2,000 Units by mouth daily.   Marland Kitchen CIALIS 5 MG tablet TAKE 1 TABLET DAILY FOR    PROSTATISM  . clobetasol cream (TEMOVATE) 0.05 % APPLY TO RASH TWICE DAILY AS NEEDED  . cyclobenzaprine (FLEXERIL)  10 MG tablet Take 10 mg by mouth as needed for muscle spasms.  . Glucosamine-Chondroitin (GLUCOSAMINE CHONDR COMPLEX PO) Take by mouth. Take 2 tablets daily  . linaclotide (LINZESS) 72 MCG capsule Take 1 capsule (72 mcg total) by mouth daily before breakfast. (Patient taking differently: Take 72 mcg by mouth as needed. )  . Magnesium 500 MG TABS Take 2 tablets by mouth  daily.  . Melatonin 10 MG CAPS Take by mouth Nightly.  . meloxicam (MOBIC) 15 MG tablet Take 1 tablet (15 mg total) by mouth as needed for pain.  . moexipril (UNIVASC) 15 MG tablet TAKE 1 TABLET DAILY FOR    BLOOD PRESSURE  . Omega-3 Fatty Acids (FISH OIL PO) Take 1,000 mg by mouth 3 (three) times daily.   Debara Pickett 11 MG/NOSEPC EXHP UAD NEED SIG FROM MD  . pregabalin (LYRICA) 200 MG capsule Take 200 mg by mouth 2 (two) times daily.  . Probiotic Product (ALIGN) 4 MG CAPS Take 1 capsule daily for Probiotic Benefit  . rosuvastatin (CRESTOR) 40 MG tablet TAKE 1 TABLET DAILY  . TAZORAC 0.05 % cream as needed.   . Testosterone (FORTESTA) 10 MG/ACT (2%) GEL Apply 4 pumps total daily to thighs daily.  . vitamin B-12 (CYANOCOBALAMIN) 100 MCG tablet Take 100 mcg by mouth daily.  Marland Kitchen zolmitriptan (ZOMIG) 5 MG tablet as needed.   . Flaxseed, Linseed, (FLAX SEED OIL PO) Take 1,000 mg by mouth 3 (three) times daily.   Marland Kitchen isometheptene-acetaminophen-dichloralphenazone (MIDRIN) 65-100-325 MG capsule TAKE 1 TO 2 CAPSULES BY MOUTH EVERY 4 HOURS AS NEEDED FOR HEADACHE (Patient not taking: Reported on 04/15/2017)   No current facility-administered medications on file prior to visit.      Allergies: No Known Allergies   Medical History:  Past Medical History:  Diagnosis Date  . Hyperlipidemia   . Hypertension   . Pre-diabetes    no meds  . Vitamin D deficiency    Family history- Reviewed and unchanged Social history- Reviewed and unchanged   Review of Systems:  Review of Systems  Constitutional: Negative for malaise/fatigue and weight loss.  HENT: Negative for hearing loss and tinnitus.   Eyes: Negative for blurred vision and double vision.  Respiratory: Negative for cough, shortness of breath and wheezing.   Cardiovascular: Negative for chest pain, palpitations, orthopnea, claudication and leg swelling.  Gastrointestinal: Negative for abdominal pain, blood in stool, constipation, diarrhea,  heartburn, melena, nausea and vomiting.  Genitourinary: Negative.   Musculoskeletal: Negative for joint pain and myalgias.  Skin: Negative for rash.  Neurological: Negative for dizziness, tingling, sensory change, weakness and headaches.  Endo/Heme/Allergies: Negative for polydipsia.  Psychiatric/Behavioral: Negative.   All other systems reviewed and are negative.     Physical Exam: BP 122/82   Pulse 66   Temp 97.9 F (36.6 C)   Ht 5\' 8"  (1.727 m)   Wt 197 lb (89.4 kg)   SpO2 97%   BMI 29.95 kg/m  Wt Readings from Last 3 Encounters:  04/15/17 197 lb (89.4 kg)  01/12/17 194 lb 1.6 oz (88 kg)  10/15/16 193 lb (87.5 kg)   General Appearance: Well nourished, in no apparent distress. Eyes: PERRLA, EOMs, conjunctiva no swelling or erythema Sinuses: No Frontal/maxillary tenderness ENT/Mouth: Ext aud canals clear, TMs without erythema, bulging. No erythema, swelling, or exudate on post pharynx.  Tonsils not swollen or erythematous. Hearing normal.  Neck: Supple, thyroid normal.  Respiratory: Respiratory effort normal, BS equal bilaterally without rales, rhonchi, wheezing or  stridor.  Cardio: RRR with no MRGs. Brisk peripheral pulses without edema.  Abdomen: Soft, + BS.  Non tender, no guarding, rebound, hernias, masses. Lymphatics: Non tender without lymphadenopathy.  Musculoskeletal: Full ROM, 5/5 strength, Normal gait Skin: Warm, dry without rashes, lesions, ecchymosis.  Neuro: Cranial nerves intact. No cerebellar symptoms.  Psych: Awake and oriented X 3, normal affect, Insight and Judgment appropriate.    Izora Ribas, NP 3:40 PM [facility]

## 2017-04-15 ENCOUNTER — Encounter: Payer: Self-pay | Admitting: Adult Health

## 2017-04-15 ENCOUNTER — Ambulatory Visit (INDEPENDENT_AMBULATORY_CARE_PROVIDER_SITE_OTHER): Payer: 59 | Admitting: Adult Health

## 2017-04-15 VITALS — BP 122/82 | HR 66 | Temp 97.9°F | Ht 68.0 in | Wt 197.0 lb

## 2017-04-15 DIAGNOSIS — E559 Vitamin D deficiency, unspecified: Secondary | ICD-10-CM

## 2017-04-15 DIAGNOSIS — R7309 Other abnormal glucose: Secondary | ICD-10-CM | POA: Diagnosis not present

## 2017-04-15 DIAGNOSIS — I10 Essential (primary) hypertension: Secondary | ICD-10-CM

## 2017-04-15 DIAGNOSIS — Z79899 Other long term (current) drug therapy: Secondary | ICD-10-CM | POA: Diagnosis not present

## 2017-04-15 DIAGNOSIS — E663 Overweight: Secondary | ICD-10-CM

## 2017-04-15 DIAGNOSIS — E782 Mixed hyperlipidemia: Secondary | ICD-10-CM | POA: Diagnosis not present

## 2017-04-15 LAB — CBC WITH DIFFERENTIAL/PLATELET
Basophils Absolute: 70 cells/uL (ref 0–200)
Basophils Relative: 1.3 %
Eosinophils Absolute: 664 cells/uL — ABNORMAL HIGH (ref 15–500)
Eosinophils Relative: 12.3 %
HCT: 44 % (ref 38.5–50.0)
Hemoglobin: 14.5 g/dL (ref 13.2–17.1)
Lymphs Abs: 1987 cells/uL (ref 850–3900)
MCH: 27.3 pg (ref 27.0–33.0)
MCHC: 33 g/dL (ref 32.0–36.0)
MCV: 82.9 fL (ref 80.0–100.0)
MPV: 9.5 fL (ref 7.5–12.5)
Monocytes Relative: 10.7 %
Neutro Abs: 2101 cells/uL (ref 1500–7800)
Neutrophils Relative %: 38.9 %
Platelets: 301 10*3/uL (ref 140–400)
RBC: 5.31 10*6/uL (ref 4.20–5.80)
RDW: 13.8 % (ref 11.0–15.0)
Total Lymphocyte: 36.8 %
WBC mixed population: 578 cells/uL (ref 200–950)
WBC: 5.4 10*3/uL (ref 3.8–10.8)

## 2017-04-15 LAB — BASIC METABOLIC PANEL WITH GFR
BUN: 14 mg/dL (ref 7–25)
CO2: 30 mmol/L (ref 20–32)
Calcium: 9.5 mg/dL (ref 8.6–10.3)
Chloride: 102 mmol/L (ref 98–110)
Creat: 1.07 mg/dL (ref 0.70–1.25)
GFR, Est African American: 87 mL/min/{1.73_m2} (ref >=60)
GFR, Est Non African American: 75 mL/min/{1.73_m2} (ref >=60)
Glucose, Bld: 92 mg/dL (ref 65–99)
Potassium: 5.1 mmol/L (ref 3.5–5.3)
Sodium: 140 mmol/L (ref 135–146)

## 2017-04-15 LAB — HEPATIC FUNCTION PANEL
AG Ratio: 2 (calc) (ref 1.0–2.5)
ALT: 32 U/L (ref 9–46)
AST: 33 U/L (ref 10–35)
Albumin: 4.7 g/dL (ref 3.6–5.1)
Alkaline phosphatase (APISO): 84 U/L (ref 40–115)
Bilirubin, Direct: 0 mg/dL (ref 0.0–0.2)
Globulin: 2.4 g/dL (calc) (ref 1.9–3.7)
Indirect Bilirubin: 0.3 mg/dL (calc) (ref 0.2–1.2)
Total Bilirubin: 0.3 mg/dL (ref 0.2–1.2)
Total Protein: 7.1 g/dL (ref 6.1–8.1)

## 2017-04-15 LAB — LIPID PANEL
Cholesterol: 146 mg/dL (ref <200)
HDL: 47 mg/dL (ref >40)
LDL Cholesterol (Calc): 72 mg/dL (calc)
Non-HDL Cholesterol (Calc): 99 mg/dL (calc) (ref <130)
Total CHOL/HDL Ratio: 3.1 (calc) (ref <5.0)
Triglycerides: 194 mg/dL — ABNORMAL HIGH (ref <150)

## 2017-04-15 LAB — TSH: TSH: 1.17 mIU/L (ref 0.40–4.50)

## 2017-04-15 NOTE — Patient Instructions (Signed)

## 2017-06-10 NOTE — Progress Notes (Deleted)
Assessment and Plan:     Further disposition pending results of labs. Discussed med's effects and SE's.   Over 15 minutes of exam, counseling, chart review, and critical decision making was performed.   Future Appointments  Date Time Provider [place]  06/11/2017 10:00 AM Liane Comber, NP [facility] None  07/19/2017  3:30 PM Unk [name], MD [facility] None  02/08/2018  9:00 AM Unk [name], MD [facility] None    ------------------------------------------------------------------------------------------------------------------   HPI There were no vitals taken for this visit.  62 y.o.male presents for    Past Medical History:  Diagnosis Date  . Hyperlipidemia   . Hypertension   . Pre-diabetes    no meds  . Vitamin D deficiency      No Known Allergies  Current Outpatient Medications on File Prior to Visit  Medication Sig  . ALPRAZolam (XANAX) 0.5 MG tablet Take 1/2 to 1 tablet 2 to 3 x / day Only if needed for Anxiety Attack and please try to limit to 5 days /week to avoid addiction  . Ascorbic Acid (VITAMIN C) 1000 MG tablet Take 1,000 mg by mouth 2 (two) times daily.  . butalbital-acetaminophen-caffeine (FIORICET, ESGIC) 50-325-40 MG tablet TAKE 1 TABLET EVERY 4 HOURS AS NEEDED FOR HEADACHE  . Cholecalciferol (VITAMIN D-3) 5000 UNITS TABS Take 2,000 Units by mouth daily.   Marland Kitchen CIALIS 5 MG tablet TAKE 1 TABLET DAILY FOR    PROSTATISM  . clobetasol cream (TEMOVATE) 0.05 % APPLY TO RASH TWICE DAILY AS NEEDED  . cyclobenzaprine (FLEXERIL) 10 MG tablet Take 10 mg by mouth as needed for muscle spasms.  . Flaxseed, Linseed, (FLAX SEED OIL PO) Take 1,000 mg by mouth 3 (three) times daily.   . Glucosamine-Chondroitin (GLUCOSAMINE CHONDR COMPLEX PO) Take by mouth. Take 2 tablets daily  . isometheptene-acetaminophen-dichloralphenazone (MIDRIN) 65-100-325 MG capsule TAKE 1 TO 2 CAPSULES BY MOUTH EVERY 4 HOURS AS NEEDED FOR HEADACHE (Patient not taking: Reported on  04/15/2017)  . linaclotide (LINZESS) 72 MCG capsule Take 1 capsule (72 mcg total) by mouth daily before breakfast. (Patient taking differently: Take 72 mcg by mouth as needed. )  . Magnesium 500 MG TABS Take 2 tablets by mouth daily.  . Melatonin 10 MG CAPS Take by mouth Nightly.  . meloxicam (MOBIC) 15 MG tablet Take 1 tablet (15 mg total) by mouth as needed for pain.  . moexipril (UNIVASC) 15 MG tablet TAKE 1 TABLET DAILY FOR    BLOOD PRESSURE  . Omega-3 Fatty Acids (FISH OIL PO) Take 1,000 mg by mouth 3 (three) times daily.   Dan Europe XSAIL 11 MG/NOSEPC EXHP UAD ***NEED SIG FROM MD***  . pregabalin (LYRICA) 200 MG capsule Take 200 mg by mouth 2 (two) times daily.  . Probiotic Product (ALIGN) 4 MG CAPS Take 1 capsule daily for Probiotic Benefit  . rosuvastatin (CRESTOR) 40 MG tablet TAKE 1 TABLET DAILY  . TAZORAC 0.05 % cream as needed.   . Testosterone (FORTESTA) 10 MG/ACT (2%) GEL Apply 4 pumps total daily to thighs daily.  . vitamin B-12 (CYANOCOBALAMIN) 100 MCG tablet Take 100 mcg by mouth daily.  Marland Kitchen zolmitriptan (ZOMIG) 5 MG tablet as needed.    No current facility-administered medications on file prior to visit.     ROS: all negative except above.   Physical Exam:  There were no vitals taken for this visit.  General Appearance: Well nourished, in no apparent distress. Eyes: PERRLA, EOMs, conjunctiva no swelling or erythema Sinuses: No Frontal/maxillary tenderness ENT/Mouth: Ext  aud canals clear, TMs without erythema, bulging. No erythema, swelling, or exudate on post pharynx.  Tonsils not swollen or erythematous. Hearing normal.  Neck: Supple, thyroid normal.  Respiratory: Respiratory effort normal, BS equal bilaterally without rales, rhonchi, wheezing or stridor.  Cardio: RRR with no MRGs. Brisk peripheral pulses without edema.  Abdomen: Soft, + BS.  Non tender, no guarding, rebound, hernias, masses. Lymphatics: Non tender without lymphadenopathy.  Musculoskeletal: Full ROM,  5/5 strength, normal gait.  Skin: Warm, dry without rashes, lesions, ecchymosis.  Neuro: Cranial nerves intact. Normal muscle tone, no cerebellar symptoms. Sensation intact.  Psych: Awake and oriented X 3, normal affect, Insight and Judgment appropriate.     Izora Ribas, NP 12:57 PM [facility]

## 2017-06-11 ENCOUNTER — Ambulatory Visit: Payer: Self-pay | Admitting: Adult Health

## 2017-07-19 ENCOUNTER — Ambulatory Visit (INDEPENDENT_AMBULATORY_CARE_PROVIDER_SITE_OTHER): Payer: 59 | Admitting: Internal Medicine

## 2017-07-19 ENCOUNTER — Encounter: Payer: Self-pay | Admitting: Internal Medicine

## 2017-07-19 VITALS — BP 126/84 | HR 80 | Temp 97.7°F | Resp 18 | Ht 68.0 in | Wt 198.0 lb

## 2017-07-19 DIAGNOSIS — E782 Mixed hyperlipidemia: Secondary | ICD-10-CM | POA: Diagnosis not present

## 2017-07-19 DIAGNOSIS — Z79899 Other long term (current) drug therapy: Secondary | ICD-10-CM | POA: Diagnosis not present

## 2017-07-19 DIAGNOSIS — E349 Endocrine disorder, unspecified: Secondary | ICD-10-CM | POA: Diagnosis not present

## 2017-07-19 DIAGNOSIS — E559 Vitamin D deficiency, unspecified: Secondary | ICD-10-CM | POA: Diagnosis not present

## 2017-07-19 DIAGNOSIS — I10 Essential (primary) hypertension: Secondary | ICD-10-CM | POA: Diagnosis not present

## 2017-07-19 DIAGNOSIS — R7303 Prediabetes: Secondary | ICD-10-CM

## 2017-07-19 NOTE — Patient Instructions (Signed)

## 2017-07-19 NOTE — Progress Notes (Signed)
This very nice 62 y.o. [name] presents for 3 month follow up with HTN, HLD, Pre-Diabetes and Vitamin D Deficiency.      Patient is treated for HTN (1999) & BP has been controlled at home. Today's BP is at goal - 126/84. Patient has had no complaints of any cardiac type chest pain, palpitations, dyspnea / orthopnea / PND, dizziness, claudication, or dependent edema.     Hyperlipidemia is controlled with diet & meds. Patient denies myalgias or other med SE's. Last Lipids were at goal albeit elevated Trig's:  Lab Results  Component Value Date   CHOL 146 04/15/2017   HDL 47 04/15/2017   LDLCALC 72 04/15/2017   TRIG 194 (H) 04/15/2017   CHOLHDL 3.1 04/15/2017      Also, the patient has history of PreDiabetes (A1c 5.5%/2011) and has had no symptoms of reactive hypoglycemia, diabetic polys, paresthesias or visual blurring.  Last A1c was Normal & at goal: Lab Results  Component Value Date   HGBA1C 5.5 01/12/2017      Patient has been on Testosterone Replacement since 2005 with improved stamina & sense of well being. Further, the patient also has history of Vitamin D Deficiency and supplements vitamin D without any suspected side-effects. Last vitamin D was at goal: Lab Results  Component Value Date   VD25OH 62 01/12/2017    No Known Allergies PMHx:   Past Medical History:  Diagnosis Date  . Hyperlipidemia   . Hypertension   . Pre-diabetes    no meds  . Vitamin D deficiency    Immunization History  Administered Date(s) Administered  . Influenza Split 01/08/2014  . Influenza Whole 01/05/2013  . Influenza-Unspecified 01/05/2015  . PPD Test 01/08/2014, 01/14/2015, 12/12/2015, 01/12/2017  . Tdap 06/15/2008   Past Surgical History:  Procedure Laterality Date  . ESOPHAGOGASTRODUODENOSCOPY    . KNEE SURGERY     left  . ORIF ACETABULAR FRACTURE     left arm 2 times   FHx:    Reviewed / unchanged  SHx:    Reviewed / unchanged   Systems Review:  Constitutional: Denies fever,  chills, wt changes, headaches, insomnia, fatigue, night sweats, change in appetite. Eyes: Denies redness, blurred vision, diplopia, discharge, itchy, watery eyes.  ENT: Denies discharge, congestion, post nasal drip, epistaxis, sore throat, earache, hearing loss, dental pain, tinnitus, vertigo, sinus pain, snoring.  CV: Denies chest pain, palpitations, irregular heartbeat, syncope, dyspnea, diaphoresis, orthopnea, PND, claudication or edema. Respiratory: denies cough, dyspnea, DOE, pleurisy, hoarseness, laryngitis, wheezing.  Gastrointestinal: Denies dysphagia, odynophagia, heartburn, reflux, water brash, abdominal pain or cramps, nausea, vomiting, bloating, diarrhea, constipation, hematemesis, melena, hematochezia  or hemorrhoids. Genitourinary: Denies dysuria, frequency, urgency, nocturia, hesitancy, discharge, hematuria or flank pain. Musculoskeletal: Denies arthralgias, myalgias, stiffness, jt. swelling, pain, limping or strain/sprain.  Skin: Denies pruritus, rash, hives, warts, acne, eczema or change in skin lesion(s). Neuro: No weakness, tremor, incoordination, spasms, paresthesia or pain. Psychiatric: Denies confusion, memory loss or sensory loss. Endo: Denies change in weight, skin or hair change.  Heme/Lymph: No excessive bleeding, bruising or enlarged lymph nodes.  Physical Exam  BP 126/84   Pulse 80   Temp 97.7 F (36.5 C)   Resp 18   Ht 5\' 8"  (1.727 m)   Wt 198 lb (89.8 kg)   BMI 30.11 kg/m   Appears  well nourished, well groomed  and in no distress.  Eyes: PERRLA, EOMs, conjunctiva no swelling or erythema. Sinuses: No frontal/maxillary tenderness ENT/Mouth: EAC's clear, TM's  nl w/o erythema, bulging. Nares clear w/o erythema, swelling, exudates. Oropharynx clear without erythema or exudates. Oral hygiene is good. Tongue normal, non obstructing. Hearing intact.  Neck: Supple. Thyroid not palpable. Car 2+/2+ without bruits, nodes or JVD. Chest: Respirations nl with BS clear  & equal w/o rales, rhonchi, wheezing or stridor.  Cor: Heart sounds normal w/ regular rate and rhythm without sig. murmurs, gallops, clicks or rubs. Peripheral pulses normal and equal  without edema.  Abdomen: Soft & bowel sounds normal. Non-tender w/o guarding, rebound, hernias, masses or organomegaly.  Lymphatics: Unremarkable.  Musculoskeletal: Full ROM all peripheral extremities, joint stability, 5/5 strength and normal gait.  Skin: Warm, dry without exposed rashes, lesions or ecchymosis apparent.  Neuro: Cranial nerves intact, reflexes equal bilaterally. Sensory-motor testing grossly intact. Tendon reflexes grossly intact.  Pysch: Alert & oriented x 3.  Insight and judgement nl & appropriate. No ideations.  Assessment and Plan:  1. Essential hypertension  - Continue medication, monitor blood pressure at home.  - Continue DASH diet.  Reminder to go to the ER if any CP,  SOB, nausea, dizziness, severe HA, changes vision/speech.  - CBC with Differential/Platelet - BASIC METABOLIC PANEL WITH GFR - Magnesium - TSH  2. Hyperlipidemia, mixed  - Continue diet/meds, exercise,& lifestyle modifications.  - Continue monitor periodic cholesterol/liver & renal functions   - Hepatic function panel - Lipid panel - TSH  3. Pre-diabetes  - Continue diet, exercise, lifestyle modifications.  - Monitor appropriate labs.  - Hemoglobin A1c - Insulin, random  4. Vitamin D deficiency - Continue supplementation.  - VITAMIN D 25 Hydroxyl  5. Testosterone deficiency  - Testosterone  6. Medication management  - CBC with Differential/Platelet - BASIC METABOLIC PANEL WITH GFR - Hepatic function panel - Magnesium - Lipid panel - TSH - Hemoglobin A1c - Insulin, random - VITAMIN D 25 Hydroxy - Testosterone           Discussed  regular exercise, BP monitoring, weight control to achieve/maintain BMI less than 25 and discussed med and SE's. Recommended labs to assess and monitor clinical  status with further disposition pending results of labs. Over 30 minutes of exam, counseling, chart review was performed.

## 2017-07-20 LAB — BASIC METABOLIC PANEL WITH GFR
BUN: 18 mg/dL (ref 7–25)
CO2: 29 mmol/L (ref 20–32)
Calcium: 9.4 mg/dL (ref 8.6–10.3)
Chloride: 104 mmol/L (ref 98–110)
Creat: 1.13 mg/dL (ref 0.70–1.25)
GFR, Est African American: 81 mL/min/{1.73_m2} (ref >=60)
GFR, Est Non African American: 70 mL/min/{1.73_m2} (ref >=60)
Glucose, Bld: 115 mg/dL — ABNORMAL HIGH (ref 65–99)
Potassium: 4.3 mmol/L (ref 3.5–5.3)
Sodium: 140 mmol/L (ref 135–146)

## 2017-07-20 LAB — CBC WITH DIFFERENTIAL/PLATELET
Basophils Absolute: 59 cells/uL (ref 0–200)
Basophils Relative: 1.1 %
Eosinophils Absolute: 227 cells/uL (ref 15–500)
Eosinophils Relative: 4.2 %
HCT: 42 % (ref 38.5–50.0)
Hemoglobin: 13.9 g/dL (ref 13.2–17.1)
Lymphs Abs: 1156 cells/uL (ref 850–3900)
MCH: 27.6 pg (ref 27.0–33.0)
MCHC: 33.1 g/dL (ref 32.0–36.0)
MCV: 83.5 fL (ref 80.0–100.0)
MPV: 9.7 fL (ref 7.5–12.5)
Monocytes Relative: 8.9 %
Neutro Abs: 3478 cells/uL (ref 1500–7800)
Neutrophils Relative %: 64.4 %
Platelets: 298 10*3/uL (ref 140–400)
RBC: 5.03 10*6/uL (ref 4.20–5.80)
RDW: 13.6 % (ref 11.0–15.0)
Total Lymphocyte: 21.4 %
WBC mixed population: 481 cells/uL (ref 200–950)
WBC: 5.4 10*3/uL (ref 3.8–10.8)

## 2017-07-20 LAB — TSH: TSH: 0.86 mIU/L (ref 0.40–4.50)

## 2017-07-20 LAB — HEPATIC FUNCTION PANEL
AG Ratio: 1.9 (calc) (ref 1.0–2.5)
ALT: 29 U/L (ref 9–46)
AST: 32 U/L (ref 10–35)
Albumin: 4.3 g/dL (ref 3.6–5.1)
Alkaline phosphatase (APISO): 77 U/L (ref 40–115)
Bilirubin, Direct: 0.1 mg/dL (ref 0.0–0.2)
Globulin: 2.3 g/dL (calc) (ref 1.9–3.7)
Indirect Bilirubin: 0.2 mg/dL (calc) (ref 0.2–1.2)
Total Bilirubin: 0.3 mg/dL (ref 0.2–1.2)
Total Protein: 6.6 g/dL (ref 6.1–8.1)

## 2017-07-20 LAB — LIPID PANEL
Cholesterol: 140 mg/dL (ref <200)
HDL: 45 mg/dL (ref >40)
LDL Cholesterol (Calc): 68 mg/dL (calc)
Non-HDL Cholesterol (Calc): 95 mg/dL (calc) (ref <130)
Total CHOL/HDL Ratio: 3.1 (calc) (ref <5.0)
Triglycerides: 194 mg/dL — ABNORMAL HIGH (ref <150)

## 2017-07-20 LAB — HEMOGLOBIN A1C
Hgb A1c MFr Bld: 5.6 % of total Hgb (ref <5.7)
Mean Plasma Glucose: 114 (calc)
eAG (mmol/L): 6.3 (calc)

## 2017-07-20 LAB — MAGNESIUM: Magnesium: 2.2 mg/dL (ref 1.5–2.5)

## 2017-07-20 LAB — INSULIN, RANDOM: Insulin: 39.3 u[IU]/mL — ABNORMAL HIGH (ref 2.0–19.6)

## 2017-07-20 LAB — VITAMIN D 25 HYDROXY (VIT D DEFICIENCY, FRACTURES): Vit D, 25-Hydroxy: 57 ng/mL (ref 30–100)

## 2017-07-20 LAB — TESTOSTERONE: Testosterone: 172 ng/dL — ABNORMAL LOW (ref 250–827)

## 2017-08-06 ENCOUNTER — Other Ambulatory Visit: Payer: Self-pay | Admitting: Internal Medicine

## 2017-08-31 ENCOUNTER — Other Ambulatory Visit: Payer: Self-pay | Admitting: Internal Medicine

## 2017-09-01 ENCOUNTER — Other Ambulatory Visit: Payer: Self-pay | Admitting: Internal Medicine

## 2017-09-01 DIAGNOSIS — E349 Endocrine disorder, unspecified: Secondary | ICD-10-CM

## 2017-10-24 ENCOUNTER — Other Ambulatory Visit: Payer: Self-pay | Admitting: Internal Medicine

## 2017-11-03 ENCOUNTER — Ambulatory Visit: Payer: Self-pay | Admitting: Adult Health

## 2017-11-11 ENCOUNTER — Ambulatory Visit: Payer: Self-pay | Admitting: Adult Health

## 2017-11-16 DIAGNOSIS — G43019 Migraine without aura, intractable, without status migrainosus: Secondary | ICD-10-CM | POA: Diagnosis not present

## 2017-11-16 DIAGNOSIS — G43719 Chronic migraine without aura, intractable, without status migrainosus: Secondary | ICD-10-CM | POA: Diagnosis not present

## 2017-11-17 NOTE — Progress Notes (Signed)
FOLLOW UP  Assessment and Plan:   Hypertension Well controlled with current medications  Monitor blood pressure at home; patient to call if consistently greater than 130/80 Continue DASH diet.   Reminder to go to the ER if any CP, SOB, nausea, dizziness, severe HA, changes vision/speech, left arm numbness and tingling and jaw pain.  Cholesterol Currently at goal; continue statin Continue low cholesterol diet and exercise.  Check lipid panel.   Other abnormal glucose Recent A1Cs at goal Discussed diet/exercise, weight management  Defer A1C; check CMP  Overweight Long discussion about weight loss, diet, and exercise Recommended diet heavy in fruits and veggies and low in animal meats, cheeses, and dairy products, appropriate calorie intake Patient will work on increasing exercise; improved choices, cutting down on sweets after the holiday Will follow up in 3 months  Vitamin D Def Near goal at last visit; continue supplementation to maintain goal of 70-100 Defer Vit D level   Hypogonadism - continue to monitor, states medication is helping with symptoms of low T.   Cough Likely viral; Discussed the importance of avoiding unnecessary antibiotic therapy. Suggested symptomatic OTC remedies. Nasal saline spray for congestion. Nasal steroids, allergy pill, oral steroids offered Follow up as needed. Promethazine DM sent in -  Continue diet and meds as discussed. Further disposition pending results of labs. Discussed med's effects and SE's.   Over 30 minutes of exam, counseling, chart review, and critical decision making was performed.   Future Appointments  Date Time Provider [place]  02/08/2018  9:00 AM Unk [name], MD [facility] None    ----------------------------------------------------------------------------------------------------------------------  HPI 62 y.o. male  presents for 3 month follow up on hypertension, cholesterol, glucose management,  weight and vitamin D deficiency.   He has had a busy summer due to his mother breaking her leg last month.   He reports minor sinus symptoms; cough at night; taking typical allergy medications and decongestants and flonase; requesting alternate cough medication.   Migraines; doing fairly, followed by Dr. Orie Rout at [facility]; has had more with increased stress but typically well controlled, ~1/month, mild.   He is prescribed xanax, takes 0.25 mg PRN at night for sleep, typically takes only 3-4 times per month, increased with stress.   BMI is Body mass index is 28.65 kg/m., he has been working on diet and exercise, he runs as weather allows. He monitors weights. Wt Readings from Last 3 Encounters:  11/18/17 188 lb 6.4 oz (85.5 kg)  07/19/17 198 lb (89.8 kg)  04/15/17 197 lb (89.4 kg)   His blood pressure has been controlled at home, today their BP is BP: 110/72  He does workout. He denies chest pain, shortness of breath, dizziness.   He is on cholesterol medication (rosuvastatin 20 mg daily) and denies myalgias. His cholesterol is at goal. The cholesterol last visit was:   Lab Results  Component Value Date   CHOL 140 07/19/2017   HDL 45 07/19/2017   LDLCALC 68 07/19/2017   TRIG 194 (H) 07/19/2017   CHOLHDL 3.1 07/19/2017    He has been working on diet and exercise for hx of prediabetes recently well controlled, and denies increased appetite, nausea, paresthesia of the feet, polydipsia, polyuria, visual disturbances and vomiting. Last A1C in the office was:  Lab Results  Component Value Date   HGBA1C 5.6 07/19/2017   Patient is on Vitamin D supplement and near goal:   Lab Results  Component Value Date   VD25OH 57 07/19/2017  He has a history of testosterone deficiency and is on testosterone replacement (gel). He states that the testosterone helps with his energy, libido, muscle mass. Lab Results  Component Value Date   TESTOSTERONE 172 (L) 07/19/2017      Current Medications:  Current Outpatient Medications on File Prior to Visit  Medication Sig  . ALPRAZolam (XANAX) 0.5 MG tablet Take 1/2 to 1 tablet 2 to 3 x / day Only if needed for Anxiety Attack and please try to limit to 5 days /week to avoid addiction  . Ascorbic Acid (VITAMIN C) 1000 MG tablet Take 1,000 mg by mouth 2 (two) times daily.  . butalbital-acetaminophen-caffeine (FIORICET, ESGIC) 50-325-40 MG tablet TAKE 1 TABLET EVERY 4 HOURS AS NEEDED FOR HEADACHE  . Cholecalciferol (VITAMIN D-3) 5000 UNITS TABS Take 2,000 Units by mouth daily.   Marland Kitchen CIALIS 5 MG tablet TAKE 1 TABLET DAILY FOR    PROSTATISM  . clobetasol cream (TEMOVATE) 0.05 % APPLY TO RASH TWICE DAILY AS NEEDED  . cyclobenzaprine (FLEXERIL) 10 MG tablet Take 10 mg by mouth as needed for muscle spasms.  . Glucosamine-Chondroitin (GLUCOSAMINE CHONDR COMPLEX PO) Take by mouth. Take 2 tablets daily  . linaclotide (LINZESS) 72 MCG capsule Take 1 capsule (72 mcg total) by mouth daily before breakfast. (Patient taking differently: Take 72 mcg by mouth as needed. )  . Magnesium 500 MG TABS Take 2 tablets by mouth daily.  . Melatonin 10 MG CAPS Take by mouth Nightly.  . meloxicam (MOBIC) 15 MG tablet Take 1 tablet (15 mg total) by mouth as needed for pain.  . moexipril (UNIVASC) 15 MG tablet TAKE 1 TABLET DAILY FOR    BLOOD PRESSURE  . Omega-3 Fatty Acids (FISH OIL PO) Take 1,000 mg by mouth 3 (three) times daily.   Debara Pickett 11 MG/NOSEPC EXHP UAD NEED SIG FROM MD  . pregabalin (LYRICA) 200 MG capsule Take 200 mg by mouth 2 (two) times daily.  . Probiotic Product (ALIGN) 4 MG CAPS Take 1 capsule daily for Probiotic Benefit  . rosuvastatin (CRESTOR) 40 MG tablet TAKE 1 TABLET DAILY  . TAZORAC 0.05 % cream as needed.   . Testosterone (FORTESTA) 10 MG/ACT (2%) GEL Apply 4 pumps total daily to thighs daily.  . vitamin B-12 (CYANOCOBALAMIN) 100 MCG tablet Take 100 mcg by mouth daily.  Marland Kitchen zolmitriptan (ZOMIG) 5 MG tablet as  needed.   . Flaxseed, Linseed, (FLAX SEED OIL PO) Take 1,000 mg by mouth 3 (three) times daily.   Marland Kitchen isometheptene-acetaminophen-dichloralphenazone (MIDRIN) 65-100-325 MG capsule TAKE 1 TO 2 CAPSULES BY MOUTH EVERY 4 HOURS AS NEEDED FOR HEADACHE (Patient not taking: Reported on 04/15/2017)   No current facility-administered medications on file prior to visit.      Allergies: No Known Allergies   Medical History:  Past Medical History:  Diagnosis Date  . Hyperlipidemia   . Hypertension   . Pre-diabetes    no meds  . Vitamin D deficiency    Family history- Reviewed and unchanged Social history- Reviewed and unchanged   Review of Systems:  Review of Systems  Constitutional: Negative for malaise/fatigue and weight loss.  HENT: Positive for congestion. Negative for hearing loss, sinus pain, sore throat and tinnitus.   Eyes: Negative for blurred vision and double vision.  Respiratory: Positive for cough. Negative for sputum production, shortness of breath and wheezing.   Cardiovascular: Negative for chest pain, palpitations, orthopnea, claudication and leg swelling.  Gastrointestinal: Negative for abdominal pain, blood in stool,  constipation, diarrhea, heartburn, melena, nausea and vomiting.  Genitourinary: Negative.   Musculoskeletal: Negative for joint pain and myalgias.  Skin: Negative for rash.  Neurological: Negative for dizziness, tingling, sensory change, weakness and headaches.  Endo/Heme/Allergies: Positive for environmental allergies. Negative for polydipsia.  Psychiatric/Behavioral: Negative.   All other systems reviewed and are negative.    Physical Exam: BP 110/72   Pulse 80   Temp (!) 97.5 F (36.4 C)   Ht 5\' 8"  (1.727 m)   Wt 188 lb 6.4 oz (85.5 kg)   SpO2 92%   BMI 28.65 kg/m  Wt Readings from Last 3 Encounters:  11/18/17 188 lb 6.4 oz (85.5 kg)  07/19/17 198 lb (89.8 kg)  04/15/17 197 lb (89.4 kg)   General Appearance: Well nourished, in no apparent  distress. Eyes: PERRLA, EOMs, conjunctiva no swelling or erythema Sinuses: No Frontal/maxillary tenderness ENT/Mouth: Ext aud canals clear, TMs without erythema, bulging. No erythema, swelling, or exudate on post pharynx.  Tonsils not swollen or erythematous. Hearing normal.  Neck: Supple, thyroid normal.  Respiratory: Respiratory effort normal, BS equal bilaterally without rales, rhonchi, wheezing or stridor.  Cardio: RRR with no MRGs. Brisk peripheral pulses without edema.  Abdomen: Soft, + BS.  Non tender, no guarding, rebound, hernias, masses. Lymphatics: Non tender without lymphadenopathy.  Musculoskeletal: Full ROM, 5/5 strength, Normal gait Skin: Warm, dry without rashes, lesions, ecchymosis.  Neuro: Cranial nerves intact. No cerebellar symptoms.  Psych: Awake and oriented X 3, normal affect, Insight and Judgment appropriate.    Izora Ribas, NP 11:36 AM [facility]

## 2017-11-18 ENCOUNTER — Ambulatory Visit: Payer: BLUE CROSS/BLUE SHIELD | Admitting: Adult Health

## 2017-11-18 ENCOUNTER — Encounter: Payer: Self-pay | Admitting: Adult Health

## 2017-11-18 VITALS — BP 110/72 | HR 80 | Temp 97.5°F | Ht 68.0 in | Wt 188.4 lb

## 2017-11-18 DIAGNOSIS — E663 Overweight: Secondary | ICD-10-CM

## 2017-11-18 DIAGNOSIS — Z79899 Other long term (current) drug therapy: Secondary | ICD-10-CM

## 2017-11-18 DIAGNOSIS — I10 Essential (primary) hypertension: Secondary | ICD-10-CM

## 2017-11-18 DIAGNOSIS — E291 Testicular hypofunction: Secondary | ICD-10-CM

## 2017-11-18 DIAGNOSIS — E559 Vitamin D deficiency, unspecified: Secondary | ICD-10-CM

## 2017-11-18 DIAGNOSIS — R7309 Other abnormal glucose: Secondary | ICD-10-CM

## 2017-11-18 DIAGNOSIS — E782 Mixed hyperlipidemia: Secondary | ICD-10-CM

## 2017-11-18 MED ORDER — ROSUVASTATIN CALCIUM 40 MG PO TABS: 40.0000 mg | ORAL_TABLET | Freq: Every day | ORAL | 1 refills | Status: DC

## 2017-11-18 MED ORDER — PROMETHAZINE-DM 6.25-15 MG/5ML PO SYRP: 5.0000 mL | ORAL_SOLUTION | Freq: Four times a day (QID) | ORAL | 1 refills | Status: DC | PRN

## 2017-11-18 NOTE — Patient Instructions (Signed)

## 2017-11-19 LAB — CBC WITH DIFFERENTIAL/PLATELET
Basophils Absolute: 76 cells/uL (ref 0–200)
Basophils Relative: 0.8 %
Eosinophils Absolute: 285 cells/uL (ref 15–500)
Eosinophils Relative: 3 %
HCT: 46.6 % (ref 38.5–50.0)
Hemoglobin: 14.9 g/dL (ref 13.2–17.1)
Lymphs Abs: 1235 cells/uL (ref 850–3900)
MCH: 26.3 pg — ABNORMAL LOW (ref 27.0–33.0)
MCHC: 32 g/dL (ref 32.0–36.0)
MCV: 82.2 fL (ref 80.0–100.0)
MPV: 9.6 fL (ref 7.5–12.5)
Monocytes Relative: 6.5 %
Neutro Abs: 7287 cells/uL (ref 1500–7800)
Neutrophils Relative %: 76.7 %
Platelets: 351 10*3/uL (ref 140–400)
RBC: 5.67 10*6/uL (ref 4.20–5.80)
RDW: 13.7 % (ref 11.0–15.0)
Total Lymphocyte: 13 %
WBC mixed population: 618 cells/uL (ref 200–950)
WBC: 9.5 10*3/uL (ref 3.8–10.8)

## 2017-11-19 LAB — LIPID PANEL
Cholesterol: 145 mg/dL (ref <200)
HDL: 39 mg/dL — ABNORMAL LOW (ref >40)
LDL Cholesterol (Calc): 82 mg/dL (calc)
Non-HDL Cholesterol (Calc): 106 mg/dL (calc) (ref <130)
Total CHOL/HDL Ratio: 3.7 (calc) (ref <5.0)
Triglycerides: 144 mg/dL (ref <150)

## 2017-11-19 LAB — COMPLETE METABOLIC PANEL WITH GFR
AG Ratio: 1.9 (calc) (ref 1.0–2.5)
ALT: 32 U/L (ref 9–46)
AST: 28 U/L (ref 10–35)
Albumin: 4.5 g/dL (ref 3.6–5.1)
Alkaline phosphatase (APISO): 102 U/L (ref 40–115)
BUN: 20 mg/dL (ref 7–25)
CO2: 27 mmol/L (ref 20–32)
Calcium: 9.7 mg/dL (ref 8.6–10.3)
Chloride: 101 mmol/L (ref 98–110)
Creat: 1.02 mg/dL (ref 0.70–1.25)
GFR, Est African American: 92 mL/min/{1.73_m2} (ref >=60)
GFR, Est Non African American: 79 mL/min/{1.73_m2} (ref >=60)
Globulin: 2.4 g/dL (calc) (ref 1.9–3.7)
Glucose, Bld: 95 mg/dL (ref 65–99)
Potassium: 4.5 mmol/L (ref 3.5–5.3)
Sodium: 138 mmol/L (ref 135–146)
Total Bilirubin: 0.3 mg/dL (ref 0.2–1.2)
Total Protein: 6.9 g/dL (ref 6.1–8.1)

## 2017-11-19 LAB — TSH: TSH: 1.45 mIU/L (ref 0.40–4.50)

## 2017-12-10 ENCOUNTER — Other Ambulatory Visit: Payer: Self-pay | Admitting: Internal Medicine

## 2017-12-10 DIAGNOSIS — E349 Endocrine disorder, unspecified: Secondary | ICD-10-CM

## 2017-12-10 MED ORDER — TESTOSTERONE 10 MG/ACT (2%) TD GEL: TRANSDERMAL | 1 refills | Status: DC

## 2017-12-27 ENCOUNTER — Ambulatory Visit: Payer: BLUE CROSS/BLUE SHIELD | Admitting: Internal Medicine

## 2017-12-27 ENCOUNTER — Encounter: Payer: Self-pay | Admitting: Internal Medicine

## 2017-12-27 VITALS — BP 132/88 | HR 72 | Temp 97.1°F | Resp 16 | Ht 68.0 in | Wt 191.6 lb

## 2017-12-27 DIAGNOSIS — M7021 Olecranon bursitis, right elbow: Secondary | ICD-10-CM

## 2017-12-27 DIAGNOSIS — Z23 Encounter for immunization: Secondary | ICD-10-CM

## 2017-12-27 MED ORDER — PREDNISONE 20 MG PO TABS: ORAL_TABLET | ORAL | 0 refills | Status: DC

## 2017-12-27 NOTE — Progress Notes (Signed)
  Subjective:    Patient ID: Gerald [name], male    DOB: Aug 31, 1955, 62 y.o.   MRN: [id number]  HPI   Patient presents with a 3 week hx/o mildly tender swelling of hid Rt elbow. Denies k/o overt trauma.     Medication Sig  . ALPRAZolam  0.5 MG tablet 1/2 TO 1 TAB 2 TO 3 X DAY IF NEEDED FOR ANXIETY   . VIT C 1000 MG tablet Take 1,000 mg by mouth 2 (two) times daily.  Marland Kitchen FIORICET TAKE 1 TAB EVERY 4 HOURS AS NEEDED   . VITAMIN D 5000 UNITS  Take 5,000 Units by mouth daily.   . clobetasol cream  0.05 % APPLY TO RASH TWICE DAILY AS NEEDED  . cyclobenzaprine  10 MG t Take 10 mg by mouth as needed for muscle spasms.  Marland Kitchen FLAX SEED OIL Take 1,000 mg by mouth 3 (three) times daily.   . Magnesium 500 MG TABS Take 2 tablets by mouth daily.  . Melatonin 10 MG CAPS Take by mouth Nightly.  . meloxicam  15 MG tablet Take 1 tablet (15 mg total) by mouth as needed for pain.  . moexipril 15 MG tablet TAKE 1 TABLET DAILY FOR    BLOOD PRESSURE  . Omega-3  FISH OIL  Take 1,000 mg by mouth 3 (three) times daily.   . pregabalin 200 MG capsule Take 200 mg by mouth 2 (two) times daily.  Marland Kitchen ALIGN 4 MG CAPS Take 1 capsule daily for Probiotic Benefit  . rosuvastatin  40 MG  Take 1 tablet (40 mg total) by mouth daily.  . tadalafil  5 MG tablet TAKE 1 TABLET DAILY FOR    PROSTATISM  . TAZORAC 0.05 % cream as needed.   . Testosterone 10 MG/ACT (2%) GEL Apply 4 pumps daily to Thighs  . vitamin B-12  100 MCG tablet Take 100 mcg by mouth daily.  Marland Kitchen zolmitriptan (ZOMIG) 5 MG tablet as needed.    No Known Allergies   Past Medical History:  Diagnosis Date  . Hyperlipidemia   . Hypertension   . Pre-diabetes    no meds  . Vitamin D deficiency    Review of Systems   10 point systems review negative except as above.    Objective:   Physical Exam   BP 132/88   Pulse 72   Temp (!) 97.1 F (36.2 C)   Resp 16   Ht 5\' 8"  (1.727 m)   Wt 191 lb 9.6 oz (86.9 kg)   BMI 29.13 kg/m   In no Distress  HEENT - WNL. Neck  - supple.  Chest - Clear equal BS. Cor - Nl HS. RRR w/o sig MGR. PP 1(+). No edema. MS- FROM w/o deformities.  Small effusion of the Rt Olecranon bursae w/o calor or erythema.  Neuro -  Nl w/o focal abnormalities.    Assessment & Plan:   1. Olecranon bursitis of right elbow  - predniSONE (DELTASONE) 20 MG tablet; 1 tab 3 x /day for 3 days, then 2 x /day for 3 days, then 1 x /day for 5 days, then 1 tablet every other day  Dispense: 30 tablet  2. Need for prophylactic vaccination and inoculation against influenza  - FLU VACCINE MDCK QUAD W/Preservative

## 2017-12-27 NOTE — Patient Instructions (Signed)
Bursitis Bursitis is when the fluid-filled sac (bursa) that covers and protects a joint is swollen (inflamed). Bursitis is most common near joints, especially the knees, elbows, hips, and shoulders. Follow these instructions at home:  Take medicines only as told by your doctor.  If you were prescribed an antibiotic medicine, finish it all even if you start to feel better.  Rest the affected area as told by your doctor. ? Keep the area raised up. ? Avoid doing things that make the pain worse.  Apply ice to the injured area: ? Place ice in a plastic bag. ? Place a towel between your skin and the bag. ? Leave the ice on for 20 minutes, 2-3 times a day.  Use splints, braces, pads, or walking aids as told by your doctor.  Keep all follow-up visits as told by your doctor. This is important. Contact a doctor if:  You have more pain with home care.  You have a fever.  You have chills. This information is not intended to replace advice given to you by your health care provider. Make sure you discuss any questions you have with your health care provider. Document Released: 09/10/2009 Document Revised: 08/29/2015 Document Reviewed: 06/12/2013 Elsevier Interactive Patient Education  2018 [place].  +++++++++++++++++++++++ Elbow Bursitis Elbow bursitis is inflammation of the fluid-filled sac (bursa) between the tip of your elbow bone (olecranon) and your skin. Elbow bursitis may also be called olecranon bursitis. Normally, the olecranon bursa has only a small amount of fluid in it to cushion and protect your elbow bone. Elbow bursitis causes fluid to build up inside the bursa. Over time, this swelling and inflammation can cause pain when you bend or lean on your elbow. What are the causes? Elbow bursitis may be caused by:  Elbow injury (acute trauma).  Leaning on hard surfaces for long periods of time.  Infection from an injury that breaks the skin near your elbow.  A bone growth  (spur) that forms at the tip of your elbow.  A medical condition that causes inflammation in your body, such as gout or rheumatoid arthritis.  The cause may also be unknown. What are the signs or symptoms? The first sign of elbow bursitis is usually swelling over the tip of your elbow. This can grow to be the size of a golf ball. This may start suddenly or develop gradually. You may also have:  Pain when bending or leaning on your elbow.  Restricted movement of your elbow.  If your bursitis is caused by an infection, symptoms may also include:  Redness, warmth, and tenderness of the elbow.  Drainage of pus from the swollen area over your elbow, if the skin breaks open.  How is this diagnosed? Your health care provider may be able to diagnose elbow bursitis based on your signs and symptoms, especially if you have recently been injured. Your health care provider will also do a physical exam. This may include:  X-rays to look for a bone spur or a bone fracture.  Draining fluid from the bursa to test it for infection.  Blood tests to rule out gout or rheumatoid arthritis.  How is this treated? Treatment for elbow bursitis depends on the cause. Treatment may include:  Medicines. These may include: ? Over-the-counter medicines to relieve pain and inflammation. ? Antibiotic medicines to fight infection. ? Injections of anti-inflammatory medicines (steroids).  Wrapping your elbow with a bandage.  Draining fluid from the bursa.  Wearing elbow pads.  If your  bursitis does not get better with treatment, surgery may be needed to remove the bursa. Follow these instructions at home:  Take medicines only as directed by your health care provider.  If you were prescribed an antibiotic medicine, finish all of it even if you start to feel better.  If your bursitis is caused by an injury, rest your elbow and wear your bandage as directed by your health care provider. You may alsoapply  ice to the injured area as directed by your health care provider: ? Put ice in a plastic bag. ? Place a towel between your skin and the bag. ? Leave the ice on for 20 minutes, 2-3 times per day.  Avoid any activities that cause elbow pain.  Use elbow pads or elbow wraps to cushion your elbow. Contact a health care provider if:  You have a fever.  Your symptoms do not get better with treatment.  Your pain or swelling gets worse.  Your elbow pain or swelling goes away and then returns.  You have drainage of pus from the swollen area over your elbow. This information is not intended to replace advice given to you by your health care provider. Make sure you discuss any questions you have with your health care provider. Document Released: 04/22/2006 Document Revised: 08/29/2015 Document Reviewed: 11/29/2013 Elsevier Interactive Patient Education  [employer].

## 2018-01-05 ENCOUNTER — Other Ambulatory Visit: Payer: Self-pay | Admitting: *Deleted

## 2018-01-05 MED ORDER — MOEXIPRIL HCL 15 MG PO TABS: 15.0000 mg | ORAL_TABLET | Freq: Every day | ORAL | 1 refills | Status: DC

## 2018-01-10 ENCOUNTER — Other Ambulatory Visit: Payer: Self-pay | Admitting: Internal Medicine

## 2018-01-10 DIAGNOSIS — M7021 Olecranon bursitis, right elbow: Secondary | ICD-10-CM

## 2018-01-13 DIAGNOSIS — H5213 Myopia, bilateral: Secondary | ICD-10-CM | POA: Diagnosis not present

## 2018-01-13 DIAGNOSIS — H524 Presbyopia: Secondary | ICD-10-CM | POA: Diagnosis not present

## 2018-01-20 ENCOUNTER — Other Ambulatory Visit: Payer: Self-pay | Admitting: Internal Medicine

## 2018-01-24 ENCOUNTER — Encounter: Payer: Self-pay | Admitting: Internal Medicine

## 2018-01-24 ENCOUNTER — Ambulatory Visit (INDEPENDENT_AMBULATORY_CARE_PROVIDER_SITE_OTHER): Payer: BLUE CROSS/BLUE SHIELD | Admitting: Internal Medicine

## 2018-01-24 VITALS — BP 136/80 | HR 68 | Temp 97.2°F | Resp 18 | Ht 68.0 in | Wt 190.0 lb

## 2018-01-24 DIAGNOSIS — M7021 Olecranon bursitis, right elbow: Secondary | ICD-10-CM

## 2018-01-24 MED ORDER — DEXAMETHASONE SODIUM PHOSPHATE 10 MG/ML IJ SOLN
10.0000 mg | Freq: Once | INTRAMUSCULAR | Status: AC
  Administered 2018-01-24: 10 mg

## 2018-01-24 NOTE — Progress Notes (Signed)
   Subjective:    Patient ID: Gerald [name], male    DOB: [birth year], 62 y.o.   MRN: [id number]  HPI     Patient is a very nice 62 yo [name] with hx/o a Rt elbow olecranon bursitis treated on 9/23 with a prednisone oral pulse/taper with decreased size of bursal effusion until he recently traumatized the area with subsequent in size and tenderness.  Medication Sig  . ALPRAZolam 0.5 MG  1/2 TO 1 TAB 2 TO 3 X DAY IF NEEDED   . VITAMIN C 1000 MG  Take 1,000 mg by mouth 2 (two) times daily.  Marland Kitchen FIORICET TAKE 1 TABLET BY MOUTH EVERY 4 HOURS AS NEEDED HEADACHE  . VITAMIN D 5000 UNITS  Take 5,000 Units by mouth daily.   . clobetasol cream  0.05 % APPLY TO RASH TWICE DAILY AS NEEDED  . cyclobenzaprine10 MG Take 10 mg by mouth as needed for muscle spasms.  Marland Kitchen FLAX SEED OIL  Take 1,000 mg by mouth 3 (three) times daily.   . Glucosamine-Chondroitin  Take by mouth. Take 2 tablets daily  . Magnesium 500 MG  Take 2 tablets by mouth daily.  . Melatonin 10 MG  Take by mouth Nightly.  . meloxicam  15 MG  Take 1 tablet (15 mg total) by mouth as needed for pain.  . moexipril  15 MG  Take 1 tablet (15 mg total) by mouth daily. for blood pressure  . Omega-3 FISH OIL  Take 1,000 mg by mouth 3 (three) times daily.   . pregabalin 200 MG  Take 200 mg by mouth 2 (two) times daily.  Marland Kitchen ALIGN 4 MG CAPS Take 1 capsule daily for Probiotic Benefit  . rosuvastatin  40 MG Take 1 tablet (40 mg total) by mouth daily.  . tadalafil  5 MG tablet TAKE 1 TABLET DAILY FOR    PROSTATISM  . TAZORAC 0.05 % cream as needed.   . Testosterone 10 MG 2% GEL Apply 4 pumps daily to Thighs  . vitamin B-12 (CYANOCOBALAMIN) 100 MCG tablet Take 100 mcg by mouth daily.  Marland Kitchen zolmitriptan (ZOMIG) 5 MG tablet as needed.    No Known Allergies  Review of Systems    10 point systems review negative except as above.    Objective:   Physical Exam  BP 136/80   Pulse 68   Temp (!) 97.2 F (36.2 C)   Resp 18   Ht 5\' 8"  (1.727 m)   Wt 190 lb (86.2  kg)   BMI 28.89 kg/m   HEENT - WNL. Neck - supple.  Chest - Clear equal BS. Cor - Nl HS. RRR w/o sig m MS- FROM w/o deformities. Tense golf ball sized bursa of the Rt elbow.  Neuro -  Nl w/o focal abnormalities.  Procedure  After informed consent and aseptic prep with alcohol  And 1/2 ml 1% lidocaine dermal the bursa was aspirated with a #19 gauge needle & 12 cc bloody serous fluid was removed . Then again under aseptic technique, 10 mg /1cc Dexamethasone was injected into the bursal sac and sterile bandage applied .     Assessment & Plan:   1. Olecranon bursitis of right elbow  - Aspiration of bursa  - dexamethasone (DECADRON) injection 10 mg injected

## 2018-01-25 DIAGNOSIS — D485 Neoplasm of uncertain behavior of skin: Secondary | ICD-10-CM | POA: Diagnosis not present

## 2018-01-25 DIAGNOSIS — D229 Melanocytic nevi, unspecified: Secondary | ICD-10-CM | POA: Diagnosis not present

## 2018-02-07 ENCOUNTER — Encounter: Payer: Self-pay | Admitting: Internal Medicine

## 2018-02-07 NOTE — Patient Instructions (Signed)

## 2018-02-07 NOTE — Progress Notes (Signed)
[facility]   Unk [name], M.D.     Uvaldo Bristle. Silverio Lay, P.A.-C Liane Comber, [place]                [address] [place], [place] [address] Telephone [phone] Telefax [phone] Annual  Screening/Preventative Visit  & Comprehensive Evaluation & Examination     This very nice 62 y.o. [name]  presents for a Screening /Preventative Visit & comprehensive evaluation and management of multiple medical co-morbidities.  Patient has been followed for HTN, HLD, Prediabetes and Vitamin D Deficiency.     Patient was seen recently with a Rt olecranon bursal effusion which was aspirated and instilled dexamethasone with improvement, but persists with a boggy synovium which will probably need to be excised. He intends to contact Dr Noemi Chapel, his Orthopedist .      HTN predates circa 1999. Patient's BP has been controlled at home.  Today's BP is at goal - 112/76. He had a negative heart cath in 2008 after a False Positive Myoview.  Patient denies any cardiac symptoms as chest pain, palpitations, shortness of breath, dizziness or ankle swelling.     Patient's hyperlipidemia is controlled with diet and medications. Patient denies myalgias or other medication SE's. Last lipids were at goal: Lab Results  Component Value Date   CHOL 145 11/18/2017   HDL 39 (L) 11/18/2017   LDLCALC 82 11/18/2017   TRIG 144 11/18/2017   CHOLHDL 3.7 11/18/2017      Patient has hx/o prediabetes (A1c  5.7% /2011 & 5.4% / 2014)  and patient denies reactive hypoglycemic symptoms, visual blurring, diabetic polys or paresthesias. Last A1c was Normal and at goal: Lab Results  Component Value Date   HGBA1C 5.6 07/19/2017       Patient has been on Testosterone gel replacement for Low T since 2005 with improved stamina & sense of well-being.     Finally, patient has history of Vitamin D Deficiency and last vitamin D was at goal: Lab  Results  Component Value Date   VD25OH 57 07/19/2017   Current Outpatient Medications on File Prior to Visit  Medication Sig  . ALPRAZolam (XANAX) 0.5 MG tablet 1/2 TO 1 TAB 2 TO 3 X DAY IF NEEDED FOR ANXIETY AND TRY TO LIMIT TO 5 DAYS /WEEK TO AVOID ADDICTION  . Ascorbic Acid (VITAMIN C) 1000 MG tablet Take 1,000 mg by mouth 2 (two) times daily.  . butalbital-acetaminophen-caffeine (FIORICET, ESGIC) 50-325-40 MG tablet TAKE 1 TABLET BY MOUTH EVERY 4 HOURS AS NEEDED HEADACHE  . Cholecalciferol (VITAMIN D-3) 5000 UNITS TABS Take 5,000 Units by mouth daily.   . clobetasol cream (TEMOVATE) 0.05 % APPLY TO RASH TWICE DAILY AS NEEDED  . cyclobenzaprine (FLEXERIL) 10 MG tablet Take 10 mg by mouth as needed for muscle spasms.  . Flaxseed, Linseed, (FLAX SEED OIL PO) Take 1,000 mg by mouth 3 (three) times daily.   . Glucosamine-Chondroitin (GLUCOSAMINE CHONDR COMPLEX PO) Take by mouth. Take 2 tablets daily  . Magnesium 500 MG TABS Take 2 tablets by mouth daily.  . Melatonin 10 MG CAPS Take by mouth Nightly.  . meloxicam (MOBIC) 15 MG tablet Take 1 tablet (15 mg total) by mouth as needed for pain.  . moexipril (UNIVASC) 15 MG tablet Take 1 tablet (15 mg total) by mouth daily. for blood pressure  . Omega-3 Fatty Acids (FISH  OIL PO) Take 1,000 mg by mouth 3 (three) times daily.   . pregabalin (LYRICA) 200 MG capsule Take 200 mg by mouth 2 (two) times daily.  . Probiotic Product (ALIGN) 4 MG CAPS Take 1 capsule daily for Probiotic Benefit  . rosuvastatin (CRESTOR) 40 MG tablet Take 1 tablet (40 mg total) by mouth daily.  . tadalafil (CIALIS) 5 MG tablet TAKE 1 TABLET DAILY FOR    PROSTATISM  . TAZORAC 0.05 % cream as needed.   . Testosterone 10 MG/ACT (2%) GEL Apply 4 pumps daily to Thighs  . vitamin B-12 (CYANOCOBALAMIN) 100 MCG tablet Take 100 mcg by mouth daily.  Marland Kitchen zolmitriptan (ZOMIG) 5 MG tablet as needed.    No current facility-administered medications on file prior to visit.    Past Medical  History:  Diagnosis Date  . Hyperlipidemia   . Hypertension   . Pre-diabetes    no meds  . Vitamin D deficiency    Health Maintenance  Topic Date Due  . Hepatitis C Screening  11-Aug-1955  . HIV Screening  05/11/1971  . TETANUS/TDAP  06/16/2018  . COLONOSCOPY  08/01/2024  . INFLUENZA VACCINE  Completed   Immunization History  Administered Date(s) Administered  . Influenza Inj Mdck Quad With Preservative 12/27/2017  . Influenza Split 01/08/2014  . Influenza Whole 01/05/2013  . Influenza-Unspecified 01/05/2015  . PPD Test 01/08/2014, 01/14/2015, 12/12/2015, 01/12/2017  . Tdap 06/15/2008   Last Colon - 08/02/2014 - Dr Fuller Plan - recc f/u 10 years - due Apr 2026 Past Surgical History:  Procedure Laterality Date  . ESOPHAGOGASTRODUODENOSCOPY    . KNEE SURGERY     left  . ORIF ACETABULAR FRACTURE     left arm 2 times   Family History  Problem Relation Age of Onset  . Hypertension Mother   . Hyperlipidemia Mother   . Diabetes Mother   . Hypertension Father    Social History   Socioeconomic History  . Marital status: Married    Spouse name: Not on file  . Number of children: Not on file  Occupational History  .   Tobacco Use  . Smoking status: Never Smoker  . Smokeless tobacco: Never Used  Substance and Sexual Activity  . Alcohol use: Yes    Alcohol/week: 2.0 - 5.0 standard drinks    Types: 2 - 5 Standard drinks or equivalent per week  . Drug use: No  . Sexual activity: Not on file    ROS Constitutional: Denies fever, chills, weight loss/gain, headaches, insomnia,  night sweats or change in appetite. Does c/o fatigue. Eyes: Denies redness, blurred vision, diplopia, discharge, itchy or watery eyes.  ENT: Denies discharge, congestion, post nasal drip, epistaxis, sore throat, earache, hearing loss, dental pain, Tinnitus, Vertigo, Sinus pain or snoring.  Cardio: Denies chest pain, palpitations, irregular heartbeat, syncope, dyspnea, diaphoresis, orthopnea, PND,  claudication or edema Respiratory: denies cough, dyspnea, DOE, pleurisy, hoarseness, laryngitis or wheezing.  Gastrointestinal: Denies dysphagia, heartburn, reflux, water brash, pain, cramps, nausea, vomiting, bloating, diarrhea, constipation, hematemesis, melena, hematochezia, jaundice or hemorrhoids Genitourinary: Denies dysuria, frequency, urgency, nocturia, hesitancy, discharge, hematuria or flank pain Musculoskeletal: Denies arthralgia, myalgia, stiffness, Jt. Swelling, pain, limp or strain/sprain. Denies Falls. Skin: Denies puritis, rash, hives, warts, acne, eczema or change in skin lesion Neuro: No weakness, tremor, incoordination, spasms, paresthesia or pain Psychiatric: Denies confusion, memory loss or sensory loss. Denies Depression. Endocrine: Denies change in weight, skin, hair change, nocturia, and paresthesia, diabetic polys, visual blurring or hyper / hypo glycemic  episodes.  Heme/Lymph: No excessive bleeding, bruising or enlarged lymph nodes.  Physical Exam  BP 112/76   Pulse 80   Temp (!) 97.1 F (36.2 C)   Resp 16   Ht 5\' 8"  (1.727 m)   Wt 188 lb 6.4 oz (85.5 kg)   BMI 28.65 kg/m   General Appearance: Well nourished and well groomed and in no apparent distress.  Eyes: PERRLA, EOMs, conjunctiva no swelling or erythema, normal fundi and vessels. Sinuses: No frontal/maxillary tenderness ENT/Mouth: EACs patent / TMs  nl. Nares clear without erythema, swelling, mucoid exudates. Oral hygiene is good. No erythema, swelling, or exudate. Tongue normal, non-obstructing. Tonsils not swollen or erythematous. Hearing normal.  Neck: Supple, thyroid not palpable. No bruits, nodes or JVD. Respiratory: Respiratory effort normal.  BS equal and clear bilateral without rales, rhonci, wheezing or stridor. Cardio: Heart sounds are normal with regular rate and rhythm and no murmurs, rubs or gallops. Peripheral pulses are normal and equal bilaterally without edema. No aortic or femoral  bruits. Chest: symmetric with normal excursions and percussion.  Abdomen: Soft, with Nl bowel sounds. Nontender, no guarding, rebound, hernias, masses, or organomegaly.  Lymphatics: Non tender without lymphadenopathy.  Genitourinary: No hernias.Testes nl. DRE - prostate nl for age - smooth & firm w/o nodules. Musculoskeletal: Full ROM all peripheral extremities, joint stability, 5/5 strength, and normal gait. Skin: Warm and dry without rashes, lesions, cyanosis, clubbing or  ecchymosis.  Neuro: Cranial nerves intact, reflexes equal bilaterally. Normal muscle tone, no cerebellar symptoms. Sensation intact.  Pysch: Alert and oriented X 3 with normal affect, insight and judgment appropriate.   Assessment and Plan  1. Annual Preventative/Screening Exam   2. Essential hypertension  - EKG 12-Lead - [place], RETROPERITNL ABD,  LTD - Urinalysis, Routine w reflex microscopic - Microalbumin / creatinine urine ratio - CBC with Differential/Platelet - COMPLETE METABOLIC PANEL WITH GFR - Magnesium - TSH  3. Hyperlipidemia, mixed  - EKG 12-Lead - [place], RETROPERITNL ABD,  LTD - Lipid panel - TSH  4. Pre-diabetes  - EKG 12-Lead - [place], RETROPERITNL ABD,  LTD - Hemoglobin A1c - Insulin, random  5. Vitamin D deficiency  - VITAMIN D 25 Hydroxy  6. Testosterone deficiency  - Testosterone  7. Screening for colorectal cancer  - POC Hemoccult Bld/Stl  8. Prostate cancer screening  - PSA  9. Screening examination for pulmonary tuberculosis  - TB Skin Test  10. Screening for ischemic heart disease  - EKG 12-Lead - [place], RETROPERITNL ABD,  LTD  11. FH: hypertension  - EKG 12-Lead - [place], RETROPERITNL ABD,  LTD  12. Screening for AAA (aortic abdominal aneurysm)  - [place], RETROPERITNL ABD,  LTD  13. Fatigue  - Iron,Total/Total Iron Binding Cap - Vitamin B12 - Testosterone - CBC with Differential/Platelet - TSH  14. Medication management  - Urinalysis, Routine w reflex  microscopic - Microalbumin / creatinine urine ratio - CBC with Differential/Platelet - COMPLETE METABOLIC PANEL WITH GFR - Magnesium - Lipid panel - TSH - Hemoglobin A1c - Insulin, random - VITAMIN D 25 Hydroxy       Patient was counseled in prudent diet, weight control to achieve/maintain BMI less than 25, BP monitoring, regular exercise and medications as discussed.  Discussed med effects and SE's. Routine screening labs and tests as requested with regular follow-up as recommended. Over 40 minutes of exam, counseling, chart review and high complex critical decision making was performed

## 2018-02-08 ENCOUNTER — Ambulatory Visit (INDEPENDENT_AMBULATORY_CARE_PROVIDER_SITE_OTHER): Payer: BLUE CROSS/BLUE SHIELD | Admitting: Internal Medicine

## 2018-02-08 ENCOUNTER — Encounter: Payer: Self-pay | Admitting: Internal Medicine

## 2018-02-08 VITALS — BP 112/76 | HR 80 | Temp 97.1°F | Resp 16 | Ht 68.0 in | Wt 188.4 lb

## 2018-02-08 DIAGNOSIS — Z111 Encounter for screening for respiratory tuberculosis: Secondary | ICD-10-CM | POA: Diagnosis not present

## 2018-02-08 DIAGNOSIS — Z125 Encounter for screening for malignant neoplasm of prostate: Secondary | ICD-10-CM | POA: Diagnosis not present

## 2018-02-08 DIAGNOSIS — Z13220 Encounter for screening for lipoid disorders: Secondary | ICD-10-CM

## 2018-02-08 DIAGNOSIS — Z1329 Encounter for screening for other suspected endocrine disorder: Secondary | ICD-10-CM

## 2018-02-08 DIAGNOSIS — R7303 Prediabetes: Secondary | ICD-10-CM

## 2018-02-08 DIAGNOSIS — Z130 Encounter for screening for diseases of the blood and blood-forming organs and certain disorders involving the immune mechanism: Secondary | ICD-10-CM | POA: Diagnosis not present

## 2018-02-08 DIAGNOSIS — R5383 Other fatigue: Secondary | ICD-10-CM

## 2018-02-08 DIAGNOSIS — Z1211 Encounter for screening for malignant neoplasm of colon: Secondary | ICD-10-CM

## 2018-02-08 DIAGNOSIS — Z1212 Encounter for screening for malignant neoplasm of rectum: Secondary | ICD-10-CM

## 2018-02-08 DIAGNOSIS — Z79899 Other long term (current) drug therapy: Secondary | ICD-10-CM | POA: Diagnosis not present

## 2018-02-08 DIAGNOSIS — R350 Frequency of micturition: Secondary | ICD-10-CM | POA: Diagnosis not present

## 2018-02-08 DIAGNOSIS — Z8249 Family history of ischemic heart disease and other diseases of the circulatory system: Secondary | ICD-10-CM

## 2018-02-08 DIAGNOSIS — Z131 Encounter for screening for diabetes mellitus: Secondary | ICD-10-CM | POA: Diagnosis not present

## 2018-02-08 DIAGNOSIS — Z0001 Encounter for general adult medical examination with abnormal findings: Secondary | ICD-10-CM

## 2018-02-08 DIAGNOSIS — N401 Enlarged prostate with lower urinary tract symptoms: Secondary | ICD-10-CM | POA: Diagnosis not present

## 2018-02-08 DIAGNOSIS — E559 Vitamin D deficiency, unspecified: Secondary | ICD-10-CM | POA: Diagnosis not present

## 2018-02-08 DIAGNOSIS — Z0000 Encounter for general adult medical examination without abnormal findings: Secondary | ICD-10-CM | POA: Diagnosis not present

## 2018-02-08 DIAGNOSIS — Z1389 Encounter for screening for other disorder: Secondary | ICD-10-CM | POA: Diagnosis not present

## 2018-02-08 DIAGNOSIS — E349 Endocrine disorder, unspecified: Secondary | ICD-10-CM

## 2018-02-08 DIAGNOSIS — I10 Essential (primary) hypertension: Secondary | ICD-10-CM | POA: Diagnosis not present

## 2018-02-08 DIAGNOSIS — Z136 Encounter for screening for cardiovascular disorders: Secondary | ICD-10-CM | POA: Diagnosis not present

## 2018-02-08 DIAGNOSIS — E782 Mixed hyperlipidemia: Secondary | ICD-10-CM

## 2018-02-09 LAB — COMPLETE METABOLIC PANEL WITH GFR
AG Ratio: 2 (calc) (ref 1.0–2.5)
ALT: 35 U/L (ref 9–46)
AST: 29 U/L (ref 10–35)
Albumin: 4.9 g/dL (ref 3.6–5.1)
Alkaline phosphatase (APISO): 90 U/L (ref 40–115)
BUN/Creatinine Ratio: 22 (calc) (ref 6–22)
BUN: 26 mg/dL — ABNORMAL HIGH (ref 7–25)
CO2: 30 mmol/L (ref 20–32)
Calcium: 10 mg/dL (ref 8.6–10.3)
Chloride: 101 mmol/L (ref 98–110)
Creat: 1.17 mg/dL (ref 0.70–1.25)
GFR, Est African American: 78 mL/min/{1.73_m2} (ref >=60)
GFR, Est Non African American: 67 mL/min/{1.73_m2} (ref >=60)
Globulin: 2.4 g/dL (calc) (ref 1.9–3.7)
Glucose, Bld: 56 mg/dL — ABNORMAL LOW (ref 65–99)
Potassium: 4.8 mmol/L (ref 3.5–5.3)
Sodium: 140 mmol/L (ref 135–146)
Total Bilirubin: 0.4 mg/dL (ref 0.2–1.2)
Total Protein: 7.3 g/dL (ref 6.1–8.1)

## 2018-02-09 LAB — CBC WITH DIFFERENTIAL/PLATELET
Basophils Absolute: 60 cells/uL (ref 0–200)
Basophils Relative: 1 %
Eosinophils Absolute: 180 cells/uL (ref 15–500)
Eosinophils Relative: 3 %
HCT: 50.6 % — ABNORMAL HIGH (ref 38.5–50.0)
Hemoglobin: 16.4 g/dL (ref 13.2–17.1)
Lymphs Abs: 1650 cells/uL (ref 850–3900)
MCH: 26.7 pg — ABNORMAL LOW (ref 27.0–33.0)
MCHC: 32.4 g/dL (ref 32.0–36.0)
MCV: 82.4 fL (ref 80.0–100.0)
MPV: 9.6 fL (ref 7.5–12.5)
Monocytes Relative: 10.2 %
Neutro Abs: 3498 cells/uL (ref 1500–7800)
Neutrophils Relative %: 58.3 %
Platelets: 331 10*3/uL (ref 140–400)
RBC: 6.14 10*6/uL — ABNORMAL HIGH (ref 4.20–5.80)
RDW: 16 % — ABNORMAL HIGH (ref 11.0–15.0)
Total Lymphocyte: 27.5 %
WBC mixed population: 612 cells/uL (ref 200–950)
WBC: 6 10*3/uL (ref 3.8–10.8)

## 2018-02-09 LAB — HEMOGLOBIN A1C
Hgb A1c MFr Bld: 6 % of total Hgb — ABNORMAL HIGH (ref <5.7)
Mean Plasma Glucose: 126 (calc)
eAG (mmol/L): 7 (calc)

## 2018-02-09 LAB — TSH: TSH: 1.42 mIU/L (ref 0.40–4.50)

## 2018-02-09 LAB — URINALYSIS, ROUTINE W REFLEX MICROSCOPIC
Bilirubin Urine: NEGATIVE
Glucose, UA: NEGATIVE
Hgb urine dipstick: NEGATIVE
Ketones, ur: NEGATIVE
Leukocytes, UA: NEGATIVE
Nitrite: NEGATIVE
Protein, ur: NEGATIVE
Specific Gravity, Urine: 1.02 (ref 1.001–1.03)
pH: 7.5 (ref 5.0–8.0)

## 2018-02-09 LAB — IRON, TOTAL/TOTAL IRON BINDING CAP
%SAT: 36 % (calc) (ref 20–48)
Iron: 149 ug/dL (ref 50–180)
TIBC: 416 mcg/dL (calc) (ref 250–425)

## 2018-02-09 LAB — LIPID PANEL
Cholesterol: 192 mg/dL (ref <200)
HDL: 60 mg/dL (ref >40)
LDL Cholesterol (Calc): 104 mg/dL (calc) — ABNORMAL HIGH
Non-HDL Cholesterol (Calc): 132 mg/dL (calc) — ABNORMAL HIGH (ref <130)
Total CHOL/HDL Ratio: 3.2 (calc) (ref <5.0)
Triglycerides: 167 mg/dL — ABNORMAL HIGH (ref <150)

## 2018-02-09 LAB — PSA: PSA: 3.8 ng/mL (ref <=4.0)

## 2018-02-09 LAB — MICROALBUMIN / CREATININE URINE RATIO
Creatinine, Urine: 133 mg/dL (ref 20–320)
Microalb Creat Ratio: 26 mcg/mg creat (ref <30)
Microalb, Ur: 3.4 mg/dL

## 2018-02-09 LAB — MAGNESIUM: Magnesium: 2.3 mg/dL (ref 1.5–2.5)

## 2018-02-09 LAB — TESTOSTERONE: Testosterone: 1015 ng/dL — ABNORMAL HIGH (ref 250–827)

## 2018-02-09 LAB — INSULIN, RANDOM: Insulin: 10.9 u[IU]/mL (ref 2.0–19.6)

## 2018-02-09 LAB — VITAMIN D 25 HYDROXY (VIT D DEFICIENCY, FRACTURES): Vit D, 25-Hydroxy: 58 ng/mL (ref 30–100)

## 2018-02-09 LAB — VITAMIN B12: Vitamin B-12: 780 pg/mL (ref 200–1100)

## 2018-02-16 ENCOUNTER — Other Ambulatory Visit: Payer: Self-pay | Admitting: Internal Medicine

## 2018-02-17 DIAGNOSIS — M7021 Olecranon bursitis, right elbow: Secondary | ICD-10-CM | POA: Diagnosis not present

## 2018-03-24 ENCOUNTER — Other Ambulatory Visit: Payer: Self-pay | Admitting: Internal Medicine

## 2018-03-28 ENCOUNTER — Other Ambulatory Visit: Payer: Self-pay | Admitting: Internal Medicine

## 2018-04-21 ENCOUNTER — Other Ambulatory Visit: Payer: Self-pay

## 2018-04-21 DIAGNOSIS — Z1211 Encounter for screening for malignant neoplasm of colon: Secondary | ICD-10-CM

## 2018-04-21 DIAGNOSIS — Z1212 Encounter for screening for malignant neoplasm of rectum: Principal | ICD-10-CM

## 2018-04-21 LAB — POC HEMOCCULT BLD/STL (HOME/3-CARD/SCREEN)
Card #2 Fecal Occult Blod, POC: NEGATIVE
Card #3 Fecal Occult Blood, POC: NEGATIVE
Fecal Occult Blood, POC: NEGATIVE

## 2018-04-22 DIAGNOSIS — Z1211 Encounter for screening for malignant neoplasm of colon: Secondary | ICD-10-CM | POA: Diagnosis not present

## 2018-05-11 NOTE — Progress Notes (Signed)
FOLLOW UP  Assessment and Plan:   Hypertension Well controlled with current medications  Monitor blood pressure at home; patient to call if consistently greater than 130/80 Continue DASH diet.   Reminder to go to the ER if any CP, SOB, nausea, dizziness, severe HA, changes vision/speech, left arm numbness and tingling and jaw pain.  Cholesterol Currently at goal; continue statin Continue low cholesterol diet and exercise.  Check lipid panel.   Prediabetes Discussed disease and risks Discussed diet/exercise, weight management  A1C  Overweight Long discussion about weight loss, diet, and exercise Recommended diet heavy in fruits and veggies and low in animal meats, cheeses, and dairy products, appropriate calorie intake Patient will work on increasing exercise; improved choices, cutting down on sweets after the holiday Will follow up in 3 months  Vitamin D Def Near goal at last visit; continue supplementation to maintain goal of 70-100 Defer Vit D level   Hypogonadism - continue to monitor, states medication is helping with symptoms of low T.   Continue diet and meds as discussed. Further disposition pending results of labs. Discussed med's effects and SE's.   Over 30 minutes of exam, counseling, chart review, and critical decision making was performed.   Future Appointments  Date Time Provider [place]  08/15/2018 11:00 AM Unk [name], MD [facility] None  02/24/2019  9:00 AM Unk [name], MD [facility] None    ----------------------------------------------------------------------------------------------------------------------  HPI 63 y.o. male  presents for 3 month follow up on hypertension, cholesterol, glucose management, weight and vitamin D deficiency.   He has migraines; doing fairly, followed by Dr. Orie Rout at headache clinic; has had more with increased stress but typically well controlled, ~1/month, mild. Typically increase with  stress, but doing well recently.   He is prescribed xanax, takes 0.25 mg PRN at night for sleep, typically takes only 3-4 times per month, increased with stress.   BMI is Body mass index is 29.32 kg/m., he has been working on diet and exercise, he runs as weather allows. He monitors weights closely at home.  Wt Readings from Last 3 Encounters:  05/12/18 192 lb 12.8 oz (87.5 kg)  02/08/18 188 lb 6.4 oz (85.5 kg)  01/24/18 190 lb (86.2 kg)   His blood pressure has been controlled at home, today their BP is BP: 110/78  He does workout. He denies chest pain, shortness of breath, dizziness.   He is on cholesterol medication (rosuvastatin 20 mg daily) and denies myalgias. His cholesterol is at goal. The cholesterol last visit was:   Lab Results  Component Value Date   CHOL 192 02/08/2018   HDL 60 02/08/2018   LDLCALC 104 (H) 02/08/2018   TRIG 167 (H) 02/08/2018   CHOLHDL 3.2 02/08/2018    He has been working on diet and exercise for hx of prediabetes, and denies increased appetite, nausea, paresthesia of the feet, polydipsia, polyuria, visual disturbances and vomiting. Last A1C in the office was:  Lab Results  Component Value Date   HGBA1C 6.0 (H) 02/08/2018   Patient is on Vitamin D supplement and near goal:   Lab Results  Component Value Date   VD25OH 58 02/08/2018     He has a history of testosterone deficiency and is on testosterone replacement (gel). He states that the testosterone helps with his energy, libido, muscle mass. Lab Results  Component Value Date   TESTOSTERONE 1,015 (H) 02/08/2018     Current Outpatient Medications on File Prior to Visit  Medication Sig Dispense Refill  .  ALPRAZolam (XANAX) 0.5 MG tablet Take 1/2-1 tablet 2 - 3 x /day ONLY if needed for Anxiety Attack &  limit to 5 days /week to avoid addiction 90 tablet 0  . Ascorbic Acid (VITAMIN C) 1000 MG tablet Take 1,000 mg by mouth 2 (two) times daily.    . butalbital-acetaminophen-caffeine (FIORICET,  ESGIC) 50-325-40 MG tablet TAKE 1 TABLET EVERY 4 HOURS ONLY IF NEEDED FOR SEVERE HEADACHE 30 tablet 0  . Cholecalciferol (VITAMIN D-3) 5000 UNITS TABS Take 5,000 Units by mouth daily.     . clobetasol cream (TEMOVATE) 0.05 % APPLY TO RASH TWICE DAILY AS NEEDED 60 g 3  . cyclobenzaprine (FLEXERIL) 10 MG tablet Take 10 mg by mouth as needed for muscle spasms.    . Flaxseed, Linseed, (FLAX SEED OIL PO) Take 1,000 mg by mouth 3 (three) times daily.     . Glucosamine-Chondroitin (GLUCOSAMINE CHONDR COMPLEX PO) Take by mouth. Take 2 tablets daily    . Magnesium 500 MG TABS Take 2 tablets by mouth daily.    . Melatonin 10 MG CAPS Take by mouth Nightly.    . meloxicam (MOBIC) 15 MG tablet Take 1 tablet (15 mg total) by mouth as needed for pain. 90 tablet 1  . moexipril (UNIVASC) 15 MG tablet Take 1 tablet (15 mg total) by mouth daily. for blood pressure 90 tablet 1  . Omega-3 Fatty Acids (FISH OIL PO) Take 1,000 mg by mouth 3 (three) times daily.     . pregabalin (LYRICA) 200 MG capsule Take 200 mg by mouth 2 (two) times daily.    . Probiotic Product (ALIGN) 4 MG CAPS Take 1 capsule daily for Probiotic Benefit 90 capsule 1  . rosuvastatin (CRESTOR) 40 MG tablet Take 1 tablet (40 mg total) by mouth daily. (Patient taking differently: Take 40 mg by mouth daily. Takes 1/2 tablet daily) 90 tablet 1  . tadalafil (CIALIS) 5 MG tablet TAKE 1 TABLET DAILY FOR    PROSTATISM 90 tablet 1  . TAZORAC 0.05 % cream as needed.     . Testosterone 10 MG/ACT (2%) GEL Apply 4 pumps daily to Thighs 180 g 1  . vitamin B-12 (CYANOCOBALAMIN) 100 MCG tablet Take 100 mcg by mouth daily.    Marland Kitchen zolmitriptan (ZOMIG) 5 MG tablet as needed.   0   No current facility-administered medications on file prior to visit.      Allergies: No Known Allergies   Medical History:  Past Medical History:  Diagnosis Date  . Hyperlipidemia   . Hypertension   . Pre-diabetes    no meds  . Vitamin D deficiency    Family history- Reviewed  and unchanged Social history- Reviewed and unchanged   Review of Systems:  Review of Systems  Constitutional: Negative for malaise/fatigue and weight loss.  HENT: Negative for congestion, hearing loss, sinus pain, sore throat and tinnitus.   Eyes: Negative for blurred vision and double vision.  Respiratory: Negative for cough, sputum production, shortness of breath and wheezing.   Cardiovascular: Negative for chest pain, palpitations, orthopnea, claudication and leg swelling.  Gastrointestinal: Negative for abdominal pain, blood in stool, constipation, diarrhea, heartburn, melena, nausea and vomiting.  Genitourinary: Negative.   Musculoskeletal: Negative for joint pain and myalgias.  Skin: Negative for rash.  Neurological: Negative for dizziness, tingling, sensory change, weakness and headaches.  Endo/Heme/Allergies: Negative for environmental allergies and polydipsia.  Psychiatric/Behavioral: Negative.   All other systems reviewed and are negative.    Physical Exam: BP 110/78  Pulse (!) 58   Temp 97.9 F (36.6 C)   Ht 5\' 8"  (1.727 m)   Wt 192 lb 12.8 oz (87.5 kg)   SpO2 96%   BMI 29.32 kg/m  Wt Readings from Last 3 Encounters:  05/12/18 192 lb 12.8 oz (87.5 kg)  02/08/18 188 lb 6.4 oz (85.5 kg)  01/24/18 190 lb (86.2 kg)   General Appearance: Well nourished, in no apparent distress. Eyes: PERRLA, EOMs, conjunctiva no swelling or erythema Sinuses: No Frontal/maxillary tenderness ENT/Mouth: Ext aud canals clear, TMs without erythema, bulging. No erythema, swelling, or exudate on post pharynx.  Tonsils not swollen or erythematous. Hearing normal.  Neck: Supple, thyroid normal.  Respiratory: Respiratory effort normal, BS equal bilaterally without rales, rhonchi, wheezing or stridor.  Cardio: RRR with no MRGs. Brisk peripheral pulses without edema.  Abdomen: Soft, + BS.  Non tender, no guarding, rebound, hernias, masses. Lymphatics: Non tender without lymphadenopathy.   Musculoskeletal: Full ROM, 5/5 strength, Normal gait Skin: Warm, dry without rashes, lesions, ecchymosis.  Neuro: Cranial nerves intact. No cerebellar symptoms.  Psych: Awake and oriented X 3, normal affect, Insight and Judgment appropriate.    Gerald Ribas, NP 11:24 AM [name]

## 2018-05-12 ENCOUNTER — Encounter: Payer: Self-pay | Admitting: Adult Health

## 2018-05-12 ENCOUNTER — Ambulatory Visit: Payer: BLUE CROSS/BLUE SHIELD | Admitting: Adult Health

## 2018-05-12 VITALS — BP 110/78 | HR 58 | Temp 97.9°F | Ht 68.0 in | Wt 192.8 lb

## 2018-05-12 DIAGNOSIS — E559 Vitamin D deficiency, unspecified: Secondary | ICD-10-CM | POA: Diagnosis not present

## 2018-05-12 DIAGNOSIS — E663 Overweight: Secondary | ICD-10-CM

## 2018-05-12 DIAGNOSIS — E291 Testicular hypofunction: Secondary | ICD-10-CM | POA: Diagnosis not present

## 2018-05-12 DIAGNOSIS — Z79899 Other long term (current) drug therapy: Secondary | ICD-10-CM

## 2018-05-12 DIAGNOSIS — E782 Mixed hyperlipidemia: Secondary | ICD-10-CM

## 2018-05-12 DIAGNOSIS — R7303 Prediabetes: Secondary | ICD-10-CM | POA: Diagnosis not present

## 2018-05-12 DIAGNOSIS — I10 Essential (primary) hypertension: Secondary | ICD-10-CM

## 2018-05-12 MED ORDER — TADALAFIL 5 MG PO TABS: ORAL_TABLET | ORAL | 1 refills | Status: DC

## 2018-05-12 NOTE — Patient Instructions (Signed)
Goals    . DIET - EAT MORE FRUITS AND VEGETABLES     7+ servings daily     . Exercise 150 min/wk Moderate Activity    . HEMOGLOBIN A1C < 5.7    . LDL CALC < 100       Aim for 7+ servings of fruits and vegetables daily  65-80+ fluid ounces of water or unsweet tea for healthy kidneys  Limit to max 1 drink of alcohol per day; avoid smoking/tobacco  Limit animal fats in diet for cholesterol and heart health - choose grass fed whenever available  Avoid highly processed foods, and foods high in saturated/trans fats  Aim for low stress - take time to unwind and care for your mental health  Aim for 150 min of moderate intensity exercise weekly for heart health, and weights twice weekly for bone health  Aim for 7-9 hours of sleep daily     Preventing High Cholesterol Cholesterol is a waxy, fat-like substance that your body needs in small amounts. Your liver makes all the cholesterol that your body needs. Having high cholesterol (hypercholesterolemia) increases your risk for heart disease and stroke. Extra (excess) cholesterol comes from the food you eat, such as animal-based fat (saturated fat) from meat and some dairy products. High cholesterol can often be prevented with diet and lifestyle changes. If you already have high cholesterol, you can control it with diet and lifestyle changes, as well as medicine. What nutrition changes can be made?  Eat less saturated fat. Foods that contain saturated fat include red meat and some dairy products.  Avoid processed meats, like bacon and lunch meats.  Avoid trans fats, which are found in margarine and some baked goods.  Avoid foods and beverages that have added sugars.  Eat more fruits, vegetables, and whole grains.  Choose healthy sources of protein, such as fish, poultry, and nuts.  Choose healthy sources of fat, such as: ? Nuts. ? Vegetable oils, especially olive oil. ? Fish that have healthy fats (omega-3 fatty acids), such as  mackerel or salmon. What lifestyle changes can be made?   Lose weight if you are overweight. Losing 5-10 lb (2.3-4.5 kg) can help prevent or control high cholesterol and reduce your risk for diabetes and high blood pressure. Ask your health care provider to help you with a diet and exercise plan to safely lose weight.  Get enough exercise. Do at least 150 minutes of moderate-intensity exercise each week. ? You could do this in short exercise sessions several times a day, or you could do longer exercise sessions a few times a week. For example, you could take a brisk 10-minute walk or bike ride, 3 times a day, for 5 days a week.  Do not smoke. If you need help quitting, ask your health care provider.  Limit your alcohol intake. If you drink alcohol, limit alcohol intake to no more than 1 drink a day for nonpregnant women and 2 drinks a day for men. One drink equals 12 oz of beer, 5 oz of wine, or 1 oz of hard liquor. Why are these changes important?  If you have high cholesterol, deposits (plaques) may build up on the walls of your blood vessels. Plaques make the arteries narrower and stiffer, which can restrict or block blood flow and cause blood clots to form. This greatly increases your risk for heart attack and stroke. Making diet and lifestyle changes can reduce your risk for these life-threatening conditions. What can I do to  lower my risk?  Manage your risk factors for high cholesterol. Talk with your health care provider about all of your risk factors and how to lower your risk.  Manage other conditions that you have, such as diabetes or high blood pressure (hypertension).  Have your cholesterol checked at regular intervals.  Keep all follow-up visits as told by your health care provider. This is important. How is this treated? In addition to diet and lifestyle changes, your health care provider may recommend medicines to help lower cholesterol, such as a medicine to reduce the  amount of cholesterol made in your liver. You may need medicine if:  Diet and lifestyle changes do not lower your cholesterol enough.  You have high cholesterol and other risk factors for heart disease or stroke. Take over-the-counter and prescription medicines only as told by your health care provider. Where to find more information  American Heart Association: ThisTune.com.pt.jsp  National Heart, Lung, and Blood Institute: FrenchToiletries.com.cy Summary  High cholesterol increases your risk for heart disease and stroke. By keeping your cholesterol level low, you can reduce your risk for these conditions.  Diet and lifestyle changes are the most important steps in preventing high cholesterol.  Work with your health care provider to manage your risk factors, and have your blood tested regularly. This information is not intended to replace advice given to you by your health care provider. Make sure you discuss any questions you have with your health care provider. Document Released: 04/07/2015 Document Revised: 11/30/2015 Document Reviewed: 11/30/2015 Elsevier Interactive Patient Education  2019 [employer].

## 2018-05-13 LAB — COMPLETE METABOLIC PANEL WITH GFR
AG Ratio: 2.1 (calc) (ref 1.0–2.5)
ALT: 30 U/L (ref 9–46)
AST: 38 U/L — ABNORMAL HIGH (ref 10–35)
Albumin: 4.6 g/dL (ref 3.6–5.1)
Alkaline phosphatase (APISO): 108 U/L (ref 35–144)
BUN: 21 mg/dL (ref 7–25)
CO2: 28 mmol/L (ref 20–32)
Calcium: 9.9 mg/dL (ref 8.6–10.3)
Chloride: 102 mmol/L (ref 98–110)
Creat: 1.21 mg/dL (ref 0.70–1.25)
GFR, Est African American: 74 mL/min/{1.73_m2} (ref >=60)
GFR, Est Non African American: 64 mL/min/{1.73_m2} (ref >=60)
Globulin: 2.2 g/dL (calc) (ref 1.9–3.7)
Glucose, Bld: 90 mg/dL (ref 65–99)
Potassium: 5 mmol/L (ref 3.5–5.3)
Sodium: 138 mmol/L (ref 135–146)
Total Bilirubin: 0.4 mg/dL (ref 0.2–1.2)
Total Protein: 6.8 g/dL (ref 6.1–8.1)

## 2018-05-13 LAB — CBC WITH DIFFERENTIAL/PLATELET
Absolute Monocytes: 670 cells/uL (ref 200–950)
Basophils Absolute: 68 cells/uL (ref 0–200)
Basophils Relative: 1.1 %
Eosinophils Absolute: 341 cells/uL (ref 15–500)
Eosinophils Relative: 5.5 %
HCT: 45.6 % (ref 38.5–50.0)
Hemoglobin: 15.2 g/dL (ref 13.2–17.1)
Lymphs Abs: 1321 cells/uL (ref 850–3900)
MCH: 28.7 pg (ref 27.0–33.0)
MCHC: 33.3 g/dL (ref 32.0–36.0)
MCV: 86.2 fL (ref 80.0–100.0)
MPV: 9.7 fL (ref 7.5–12.5)
Monocytes Relative: 10.8 %
Neutro Abs: 3801 cells/uL (ref 1500–7800)
Neutrophils Relative %: 61.3 %
Platelets: 334 10*3/uL (ref 140–400)
RBC: 5.29 10*6/uL (ref 4.20–5.80)
RDW: 13 % (ref 11.0–15.0)
Total Lymphocyte: 21.3 %
WBC: 6.2 10*3/uL (ref 3.8–10.8)

## 2018-05-13 LAB — LIPID PANEL
Cholesterol: 139 mg/dL (ref <200)
HDL: 45 mg/dL (ref >=40)
LDL Cholesterol (Calc): 71 mg/dL (calc)
Non-HDL Cholesterol (Calc): 94 mg/dL (calc) (ref <130)
Total CHOL/HDL Ratio: 3.1 (calc) (ref <5.0)
Triglycerides: 151 mg/dL — ABNORMAL HIGH (ref <150)

## 2018-05-13 LAB — HEMOGLOBIN A1C
Hgb A1c MFr Bld: 5.6 % of total Hgb (ref <5.7)
Mean Plasma Glucose: 114 (calc)
eAG (mmol/L): 6.3 (calc)

## 2018-05-13 LAB — TSH: TSH: 1.55 mIU/L (ref 0.40–4.50)

## 2018-05-17 DIAGNOSIS — G43719 Chronic migraine without aura, intractable, without status migrainosus: Secondary | ICD-10-CM | POA: Diagnosis not present

## 2018-05-17 DIAGNOSIS — G43019 Migraine without aura, intractable, without status migrainosus: Secondary | ICD-10-CM | POA: Diagnosis not present

## 2018-05-31 ENCOUNTER — Encounter: Payer: Self-pay | Admitting: *Deleted

## 2018-05-31 ENCOUNTER — Other Ambulatory Visit: Payer: Self-pay | Admitting: *Deleted

## 2018-05-31 ENCOUNTER — Telehealth: Payer: Self-pay | Admitting: *Deleted

## 2018-05-31 DIAGNOSIS — N400 Enlarged prostate without lower urinary tract symptoms: Secondary | ICD-10-CM | POA: Insufficient documentation

## 2018-05-31 MED ORDER — TADALAFIL 5 MG PO TABS: ORAL_TABLET | ORAL | 1 refills | Status: DC

## 2018-05-31 NOTE — Telephone Encounter (Signed)
A message was left to inform the patient that [facility] approved the request for his Tadalafil 5 mg tablet 1 daily for BPH for 36 months. Per the representative, no action is required on our part for the pharmacy to dispense the remaining amount of the medication.

## 2018-06-14 ENCOUNTER — Other Ambulatory Visit: Payer: Self-pay | Admitting: *Deleted

## 2018-06-14 MED ORDER — TADALAFIL 5 MG PO TABS: ORAL_TABLET | ORAL | 1 refills | Status: DC

## 2018-07-08 ENCOUNTER — Other Ambulatory Visit: Payer: Self-pay

## 2018-07-08 MED ORDER — MOEXIPRIL HCL 15 MG PO TABS: 15.0000 mg | ORAL_TABLET | Freq: Every day | ORAL | 1 refills | Status: DC

## 2018-08-02 ENCOUNTER — Other Ambulatory Visit: Payer: Self-pay | Admitting: Internal Medicine

## 2018-08-15 ENCOUNTER — Ambulatory Visit: Payer: BLUE CROSS/BLUE SHIELD | Admitting: Internal Medicine

## 2018-08-15 ENCOUNTER — Encounter: Payer: Self-pay | Admitting: Internal Medicine

## 2018-08-15 ENCOUNTER — Other Ambulatory Visit: Payer: Self-pay

## 2018-08-15 VITALS — BP 122/80 | HR 56 | Temp 97.0°F | Resp 16 | Ht 68.0 in | Wt 187.1 lb

## 2018-08-15 DIAGNOSIS — E559 Vitamin D deficiency, unspecified: Secondary | ICD-10-CM | POA: Diagnosis not present

## 2018-08-15 DIAGNOSIS — E782 Mixed hyperlipidemia: Secondary | ICD-10-CM

## 2018-08-15 DIAGNOSIS — Z79899 Other long term (current) drug therapy: Secondary | ICD-10-CM

## 2018-08-15 DIAGNOSIS — I10 Essential (primary) hypertension: Secondary | ICD-10-CM

## 2018-08-15 DIAGNOSIS — R7303 Prediabetes: Secondary | ICD-10-CM | POA: Diagnosis not present

## 2018-08-15 DIAGNOSIS — R7309 Other abnormal glucose: Secondary | ICD-10-CM

## 2018-08-15 DIAGNOSIS — E291 Testicular hypofunction: Secondary | ICD-10-CM

## 2018-08-15 NOTE — Patient Instructions (Signed)

## 2018-08-15 NOTE — Progress Notes (Signed)
History of Present Illness:      This very nice 63 y.o. [name] presents for 6 month follow up with HTN, HLD, Pre-Diabetes and Vitamin D Deficiency.       Patient is treated for HTN (1999) & BP has been controlled at home. Today's BP is at goal - 122/80.  In 2008, patient had a False Positive Myoview and a Negative Heart Cath. Patient has had no complaints of any cardiac type chest pain, palpitations, dyspnea / orthopnea / PND, dizziness, claudication, or dependent edema.      Hyperlipidemia is controlled with diet & Crestor. Patient denies myalgias or other med SE's. Last Lipids were  Lab Results  Component Value Date   CHOL 139 05/12/2018   HDL 45 05/12/2018   LDLCALC 71 05/12/2018   TRIG 151 (H) 05/12/2018   CHOLHDL 3.1 05/12/2018       Also, the patient has history of PreDiabetes (A1c 5.7% / 2011  & 5.4% / 2014)   and has had no symptoms of reactive hypoglycemia, diabetic polys, paresthesias or visual blurring.  Last A1c was Normal & at goal: Lab Results  Component Value Date   HGBA1C 5.6 05/12/2018      Patient has hx/o Low Testosterone & has been on gel replacement since 2005.      Further, the patient also has history of Vitamin D Deficiency and supplements vitamin D without any suspected side-effects. Last vitamin D was at goal: Lab Results  Component Value Date   VD25OH 58 02/08/2018   Current Outpatient Medications on File Prior to Visit  Medication Sig  . ALPRAZolam (XANAX) 0.5 MG tablet Take 1/2-1 tablet 2 - 3 x /day ONLY if needed for Anxiety Attack &  limit to 5 days /week to avoid addiction  . Ascorbic Acid (VITAMIN C) 1000 MG tablet Take 1,000 mg by mouth 2 (two) times daily.  . butalbital-acetaminophen-caffeine (FIORICET) 50-325-40 MG tablet Take 1 tablet every 4 to 6 hours ONLY if needed for severe Headache  . Cholecalciferol (VITAMIN D-3) 5000 UNITS TABS Take 5,000 Units by mouth daily.   . clobetasol cream (TEMOVATE) 0.05 % APPLY TO RASH TWICE DAILY AS NEEDED  .  cyclobenzaprine (FLEXERIL) 10 MG tablet Take 10 mg by mouth as needed for muscle spasms.  . Flaxseed, Linseed, (FLAX SEED OIL PO) Take 1,000 mg by mouth 3 (three) times daily.   . Glucosamine-Chondroitin (GLUCOSAMINE CHONDR COMPLEX PO) Take by mouth. Take 2 tablets daily  . Magnesium 500 MG TABS Take 2 tablets by mouth daily.  . Melatonin 10 MG CAPS Take by mouth Nightly.  . meloxicam (MOBIC) 15 MG tablet Take 1 tablet (15 mg total) by mouth as needed for pain.  . moexipril (UNIVASC) 15 MG tablet Take 1 tablet (15 mg total) by mouth daily. for blood pressure  . Omega-3 Fatty Acids (FISH OIL PO) Take 1,000 mg by mouth 3 (three) times daily.   . pregabalin (LYRICA) 200 MG capsule Take 200 mg by mouth 2 (two) times daily.  . Probiotic Product (ALIGN) 4 MG CAPS Take 1 capsule daily for Probiotic Benefit  . rosuvastatin (CRESTOR) 40 MG tablet Take 1 tablet (40 mg total) by mouth daily. (Patient taking differently: Take 40 mg by mouth daily. Takes 1/2 tablet daily)  . tadalafil (CIALIS) 5 MG tablet TAKE 1 TABLET DAILY FOR    PROSTATISM  . TAZORAC 0.05 % cream as needed.   . Testosterone 10 MG/ACT (2%) GEL Apply 4 pumps daily to Thighs  .  vitamin B-12 (CYANOCOBALAMIN) 100 MCG tablet Take 100 mcg by mouth daily.  Marland Kitchen zolmitriptan (ZOMIG) 5 MG tablet as needed.    No current facility-administered medications on file prior to visit.    PMHx:   Past Medical History:  Diagnosis Date  . Hyperlipidemia   . Hypertension   . Pre-diabetes    no meds  . Vitamin D deficiency    Immunization History  Administered Date(s) Administered  . Influenza Inj Mdck Quad With Preservative 12/27/2017  . Influenza Split 01/08/2014  . Influenza Whole 01/05/2013  . Influenza-Unspecified 01/05/2015  . PPD Test 01/08/2014, 01/14/2015, 12/12/2015, 01/12/2017, 02/08/2018  . Tdap 06/15/2008   Past Surgical History:  Procedure Laterality Date  . ESOPHAGOGASTRODUODENOSCOPY    . KNEE SURGERY     left  . ORIF ACETABULAR  FRACTURE     left arm 2 times   FHx:    Reviewed / unchanged SHx:    Reviewed / unchanged   Systems Review:  Constitutional: Denies fever, chills, wt changes, headaches, insomnia, fatigue, night sweats, change in appetite. Eyes: Denies redness, blurred vision, diplopia, discharge, itchy, watery eyes.  ENT: Denies discharge, congestion, post nasal drip, epistaxis, sore throat, earache, hearing loss, dental pain, tinnitus, vertigo, sinus pain, snoring.  CV: Denies chest pain, palpitations, irregular heartbeat, syncope, dyspnea, diaphoresis, orthopnea, PND, claudication or edema. Respiratory: denies cough, dyspnea, DOE, pleurisy, hoarseness, laryngitis, wheezing.  Gastrointestinal: Denies dysphagia, odynophagia, heartburn, reflux, water brash, abdominal pain or cramps, nausea, vomiting, bloating, diarrhea, constipation, hematemesis, melena, hematochezia  or hemorrhoids. Genitourinary: Denies dysuria, frequency, urgency, nocturia, hesitancy, discharge, hematuria or flank pain. Musculoskeletal: Denies arthralgias, myalgias, stiffness, jt. swelling, pain, limping or strain/sprain.  Skin: Denies pruritus, rash, hives, warts, acne, eczema or change in skin lesion(s). Neuro: No weakness, tremor, incoordination, spasms, paresthesia or pain. Psychiatric: Denies confusion, memory loss or sensory loss. Endo: Denies change in weight, skin or hair change.  Heme/Lymph: No excessive bleeding, bruising or enlarged lymph nodes.  Physical Exam  BP 122/80   Pulse (!) 56   Temp (!) 97 F (36.1 C)   Resp 16   Ht 5\' 8"  (1.727 m)   Wt 187 lb 1.6 oz (84.9 kg)   BMI 28.45 kg/m   Appears  well nourished, well groomed  and in no distress.  Eyes: PERRLA, EOMs, conjunctiva no swelling or erythema. Sinuses: No frontal/maxillary tenderness ENT/Mouth: EAC's clear, TM's nl w/o erythema, bulging. Nares clear w/o erythema, swelling, exudates. Oropharynx clear without erythema or exudates. Oral hygiene is good.  Tongue normal, non obstructing. Hearing intact.  Neck: Supple. Thyroid not palpable. Car 2+/2+ without bruits, nodes or JVD. Chest: Respirations nl with BS clear & equal w/o rales, rhonchi, wheezing or stridor.  Cor: Heart sounds normal w/ regular rate and rhythm without sig. murmurs, gallops, clicks or rubs. Peripheral pulses normal and equal  without edema.  Abdomen: Soft & bowel sounds normal. Non-tender w/o guarding, rebound, hernias, masses or organomegaly.  Lymphatics: Unremarkable.  Musculoskeletal: Full ROM all peripheral extremities, joint stability, 5/5 strength and normal gait.  Skin: Warm, dry without exposed rashes, lesions or ecchymosis apparent.  Neuro: Cranial nerves intact, reflexes equal bilaterally. Sensory-motor testing grossly intact. Tendon reflexes grossly intact.  Pysch: Alert & oriented x 3.  Insight and judgement nl & appropriate. No ideations.  Assessment and Plan:  1. Essential hypertension  - Continue medication, monitor blood pressure at home.  - Continue DASH diet.  Reminder to go to the ER if any CP,  SOB, nausea, dizziness,  severe HA, changes vision/speech.  - CBC with Differential/Platelet - COMPLETE METABOLIC PANEL WITH GFR - Magnesium - TSH  2. Hyperlipidemia, mixed  - Continue diet/meds, exercise,& lifestyle modifications.  - Continue monitor periodic cholesterol/liver & renal functions   - Lipid panel - TSH  3. Abnormal glucose  - Continue diet, exercise  - Lifestyle modifications.  - Monitor appropriate labs.  - Hemoglobin A1c - Insulin, random  4. Vitamin D deficiency  - Continue supplementation.   - VITAMIN D 25 Hydroxyl  5. Prediabetes  - Hemoglobin A1c - Insulin, random  6. Testosterone Deficiency  - Testosterone  7. Medication management  - CBC with Differential/Platelet - COMPLETE METABOLIC PANEL WITH GFR - Magnesium - Lipid panel - TSH - Hemoglobin A1c - Insulin, random - VITAMIN D 25 Hydroxyl) -  Testosterone      Discussed  regular exercise, BP monitoring, weight control to achieve/maintain BMI less than 25 and discussed med and SE's. Recommended labs to assess and monitor clinical status with further disposition pending results of labs. I discussed the assessment and treatment plan with the patient. The patient was provided an opportunity to ask questions and all were answered. The patient agreed with the plan and demonstrated an understanding of the instructions. Over 30 minutes of exam, counseling, chart review was performed.   Kirtland Bouchard, MD

## 2018-08-16 LAB — CBC WITH DIFFERENTIAL/PLATELET
Absolute Monocytes: 400 cells/uL (ref 200–950)
Basophils Absolute: 41 cells/uL (ref 0–200)
Basophils Relative: 1.1 %
Eosinophils Absolute: 207 cells/uL (ref 15–500)
Eosinophils Relative: 5.6 %
HCT: 42.4 % (ref 38.5–50.0)
Hemoglobin: 14.1 g/dL (ref 13.2–17.1)
Lymphs Abs: 1173 cells/uL (ref 850–3900)
MCH: 28.8 pg (ref 27.0–33.0)
MCHC: 33.3 g/dL (ref 32.0–36.0)
MCV: 86.7 fL (ref 80.0–100.0)
MPV: 9.7 fL (ref 7.5–12.5)
Monocytes Relative: 10.8 %
Neutro Abs: 1880 cells/uL (ref 1500–7800)
Neutrophils Relative %: 50.8 %
Platelets: 274 10*3/uL (ref 140–400)
RBC: 4.89 10*6/uL (ref 4.20–5.80)
RDW: 13.5 % (ref 11.0–15.0)
Total Lymphocyte: 31.7 %
WBC: 3.7 10*3/uL — ABNORMAL LOW (ref 3.8–10.8)

## 2018-08-16 LAB — COMPLETE METABOLIC PANEL WITH GFR
AG Ratio: 2.3 (calc) (ref 1.0–2.5)
ALT: 20 U/L (ref 9–46)
AST: 23 U/L (ref 10–35)
Albumin: 4.4 g/dL (ref 3.6–5.1)
Alkaline phosphatase (APISO): 83 U/L (ref 35–144)
BUN: 23 mg/dL (ref 7–25)
CO2: 27 mmol/L (ref 20–32)
Calcium: 9.3 mg/dL (ref 8.6–10.3)
Chloride: 105 mmol/L (ref 98–110)
Creat: 0.87 mg/dL (ref 0.70–1.25)
GFR, Est African American: 107 mL/min/{1.73_m2} (ref >=60)
GFR, Est Non African American: 92 mL/min/{1.73_m2} (ref >=60)
Globulin: 1.9 g/dL (calc) (ref 1.9–3.7)
Glucose, Bld: 73 mg/dL (ref 65–99)
Potassium: 4.5 mmol/L (ref 3.5–5.3)
Sodium: 139 mmol/L (ref 135–146)
Total Bilirubin: 0.3 mg/dL (ref 0.2–1.2)
Total Protein: 6.3 g/dL (ref 6.1–8.1)

## 2018-08-16 LAB — MAGNESIUM: Magnesium: 2.1 mg/dL (ref 1.5–2.5)

## 2018-08-16 LAB — HEMOGLOBIN A1C
Hgb A1c MFr Bld: 5.6 % of total Hgb (ref <5.7)
Mean Plasma Glucose: 114 (calc)
eAG (mmol/L): 6.3 (calc)

## 2018-08-16 LAB — LIPID PANEL
Cholesterol: 146 mg/dL (ref <200)
HDL: 50 mg/dL (ref >=40)
LDL Cholesterol (Calc): 76 mg/dL (calc)
Non-HDL Cholesterol (Calc): 96 mg/dL (calc) (ref <130)
Total CHOL/HDL Ratio: 2.9 (calc) (ref <5.0)
Triglycerides: 113 mg/dL (ref <150)

## 2018-08-16 LAB — VITAMIN D 25 HYDROXY (VIT D DEFICIENCY, FRACTURES): Vit D, 25-Hydroxy: 89 ng/mL (ref 30–100)

## 2018-08-16 LAB — INSULIN, RANDOM: Insulin: 4.4 u[IU]/mL

## 2018-08-16 LAB — TSH: TSH: 1.19 mIU/L (ref 0.40–4.50)

## 2018-08-16 LAB — TESTOSTERONE: Testosterone: 459 ng/dL (ref 250–827)

## 2018-09-14 ENCOUNTER — Other Ambulatory Visit: Payer: Self-pay | Admitting: Internal Medicine

## 2018-10-04 ENCOUNTER — Other Ambulatory Visit: Payer: Self-pay | Admitting: Internal Medicine

## 2018-10-04 DIAGNOSIS — E349 Endocrine disorder, unspecified: Secondary | ICD-10-CM

## 2018-10-04 MED ORDER — TESTOSTERONE 10 MG/ACT (2%) TD GEL: TRANSDERMAL | 1 refills | Status: DC

## 2018-10-05 ENCOUNTER — Other Ambulatory Visit: Payer: Self-pay | Admitting: Internal Medicine

## 2018-10-05 DIAGNOSIS — E349 Endocrine disorder, unspecified: Secondary | ICD-10-CM

## 2018-10-05 MED ORDER — TESTOSTERONE 10 MG/ACT (2%) TD GEL: TRANSDERMAL | 1 refills | Status: DC

## 2018-10-21 ENCOUNTER — Other Ambulatory Visit: Payer: Self-pay | Admitting: Internal Medicine

## 2018-11-03 ENCOUNTER — Other Ambulatory Visit: Payer: Self-pay | Admitting: Internal Medicine

## 2018-11-07 DIAGNOSIS — G43019 Migraine without aura, intractable, without status migrainosus: Secondary | ICD-10-CM | POA: Diagnosis not present

## 2018-11-07 DIAGNOSIS — G43719 Chronic migraine without aura, intractable, without status migrainosus: Secondary | ICD-10-CM | POA: Diagnosis not present

## 2018-11-10 ENCOUNTER — Other Ambulatory Visit: Payer: Self-pay | Admitting: *Deleted

## 2018-11-10 MED ORDER — ROSUVASTATIN CALCIUM 40 MG PO TABS: 40.0000 mg | ORAL_TABLET | Freq: Every day | ORAL | 1 refills | Status: DC

## 2018-11-16 NOTE — Progress Notes (Signed)
FOLLOW UP  Assessment and Plan:   Hypertension Well controlled with current medications  Monitor blood pressure at home; patient to call if consistently greater than 130/80 Continue DASH diet.   Reminder to go to the ER if any CP, SOB, nausea, dizziness, severe HA, changes vision/speech, left arm numbness and tingling and jaw pain.  Cholesterol Currently at goal; continue statin Continue low cholesterol diet and exercise.  Check lipid panel.   Prediabetes Discussed disease and risks Discussed diet/exercise, weight management  A1C  Overweight Long discussion about weight loss, diet, and exercise Recommended diet heavy in fruits and veggies and low in animal meats, cheeses, and dairy products, appropriate calorie intake Patient will work on increasing exercise; improved choices, cutting down on sweets after the holiday Will follow up in 3 months  Vitamin D Def Near goal at last visit; continue supplementation to maintain goal of 70-100 Defer Vit D level   Hypogonadism - continue to monitor, states medication is helping with symptoms of low T.   Migraine without aura and without status migrainosus, not intractable -     Rimegepant Sulfate (NURTEC) 75 MG TBDP; Take 75 mg by mouth as needed (migraine).  Screening, anemia, deficiency, iron -     Iron,Total/Total Iron Binding Cap -     Vitamin B12    Continue diet and meds as discussed. Further disposition pending results of labs. Discussed med's effects and SE's.   Over 30 minutes of exam, counseling, chart review, and critical decision making was performed.   Future Appointments  Date Time Provider [place]  02/24/2019  9:00 AM Unk [name], MD [facility] None    ----------------------------------------------------------------------------------------------------------------------  HPI 63 y.o. male  presents for 3 month follow up on hypertension, cholesterol, glucose management, weight and vitamin D  deficiency.   He has migraines; doing fairly, followed by Dr. Orie Rout at headache clinic; has had more with increased stress of wife's sister moving into NH in [place] from [place] and daughter married but typically well controlled, ~1/month. Has had some at night that have been more often, take HA medication.   He is prescribed xanax, takes 0.25 mg PRN at night for sleep, typically takes only 3-4 times per month, increased with stress.   BMI is Body mass index is 29.28 kg/m., he has been working on diet and exercise, he runs as weather allows. He monitors weights closely at home.  Wt Readings from Last 3 Encounters:  11/17/18 192 lb 9.6 oz (87.4 kg)  08/15/18 187 lb 1.6 oz (84.9 kg)  05/12/18 192 lb 12.8 oz (87.5 kg)   His blood pressure has been controlled at home, today their BP is BP: 114/74  He does workout. He denies chest pain, shortness of breath, dizziness.   He is on cholesterol medication (rosuvastatin 20 mg daily) and denies myalgias. His cholesterol is at goal. The cholesterol last visit was:   Lab Results  Component Value Date   CHOL 146 08/15/2018   HDL 50 08/15/2018   LDLCALC 76 08/15/2018   TRIG 113 08/15/2018   CHOLHDL 2.9 08/15/2018    He has been working on diet and exercise for hx of prediabetes, and denies increased appetite, nausea, paresthesia of the feet, polydipsia, polyuria, visual disturbances and vomiting. Last A1C in the office was:  Lab Results  Component Value Date   HGBA1C 5.6 08/15/2018   Patient is on Vitamin D supplement and near goal:   Lab Results  Component Value Date   VD25OH 89 08/15/2018  He has a history of testosterone deficiency and is on testosterone replacement (gel). He states that the testosterone helps with his energy, libido, muscle mass. Lab Results  Component Value Date   TESTOSTERONE 459 08/15/2018     Current Outpatient Medications on File Prior to Visit  Medication Sig Dispense Refill  . ALPRAZolam  (XANAX) 0.5 MG tablet TAKE 1/2-1 TABLET BY MOUTH 2-3 TIMES DAILY AS NEEDED FOR ANXIETY ATTACK. LIMIT TO 5 DAYS/WEEK. 90 tablet 0  . Ascorbic Acid (VITAMIN C) 1000 MG tablet Take 1,000 mg by mouth 2 (two) times daily.    . butalbital-acetaminophen-caffeine (FIORICET) 50-325-40 MG tablet TAKE 1 TABLET EVERY 4 TO 6 HOURS ONLY IF NEEDED FOR SEVERE HEADACHE 30 tablet 0  . Cholecalciferol (VITAMIN D-3) 5000 UNITS TABS Take 5,000 Units by mouth daily.     . clobetasol cream (TEMOVATE) 0.05 % APPLY TO RASH TWICE DAILY AS NEEDED 60 g 3  . cyclobenzaprine (FLEXERIL) 10 MG tablet Take 10 mg by mouth as needed for muscle spasms.    . Flaxseed, Linseed, (FLAX SEED OIL PO) Take 1,000 mg by mouth 3 (three) times daily.     . Glucosamine-Chondroitin (GLUCOSAMINE CHONDR COMPLEX PO) Take by mouth. Take 2 tablets daily    . Magnesium 500 MG TABS Take 2 tablets by mouth daily.    . Melatonin 10 MG CAPS Take by mouth Nightly.    . meloxicam (MOBIC) 15 MG tablet Take 1 tablet (15 mg total) by mouth as needed for pain. 90 tablet 1  . moexipril (UNIVASC) 15 MG tablet Take 1 tablet (15 mg total) by mouth daily. for blood pressure 90 tablet 1  . Omega-3 Fatty Acids (FISH OIL PO) Take 1,000 mg by mouth 3 (three) times daily.     . pregabalin (LYRICA) 200 MG capsule Take 200 mg by mouth 2 (two) times daily.    . Probiotic Product (ALIGN) 4 MG CAPS Take 1 capsule daily for Probiotic Benefit 90 capsule 1  . rosuvastatin (CRESTOR) 40 MG tablet Take 1 tablet (40 mg total) by mouth daily. 90 tablet 1  . tadalafil (CIALIS) 5 MG tablet TAKE 1 TABLET DAILY FOR    PROSTATISM 90 tablet 1  . TAZORAC 0.05 % cream as needed.     . Testosterone 10 MG/ACT (2%) GEL Apply 4 pumps daily to Thighs 180 g 1  . vitamin B-12 (CYANOCOBALAMIN) 100 MCG tablet Take 100 mcg by mouth daily.    Marland Kitchen zolmitriptan (ZOMIG) 5 MG tablet as needed.   0   No current facility-administered medications on file prior to visit.      Allergies: Not on File    Medical History:  Past Medical History:  Diagnosis Date  . Hyperlipidemia   . Hypertension   . Pre-diabetes    no meds  . Vitamin D deficiency    Family history- Reviewed and unchanged Social history- Reviewed and unchanged   Review of Systems:  Review of Systems  Constitutional: Negative for malaise/fatigue and weight loss.  HENT: Negative for congestion, hearing loss, sinus pain, sore throat and tinnitus.   Eyes: Negative for blurred vision and double vision.  Respiratory: Negative for cough, sputum production, shortness of breath and wheezing.   Cardiovascular: Negative for chest pain, palpitations, orthopnea, claudication and leg swelling.  Gastrointestinal: Negative for abdominal pain, blood in stool, constipation, diarrhea, heartburn, melena, nausea and vomiting.  Genitourinary: Negative.   Musculoskeletal: Negative for joint pain and myalgias.  Skin: Negative for rash.  Neurological: Negative  for dizziness, tingling, sensory change, weakness and headaches.  Endo/Heme/Allergies: Negative for environmental allergies and polydipsia.  Psychiatric/Behavioral: Negative.   All other systems reviewed and are negative.    Physical Exam: BP 114/74   Pulse 88   Temp (!) 97.5 F (36.4 C)   Ht 5\' 8"  (1.727 m)   Wt 192 lb 9.6 oz (87.4 kg)   SpO2 99%   BMI 29.28 kg/m  Wt Readings from Last 3 Encounters:  11/17/18 192 lb 9.6 oz (87.4 kg)  08/15/18 187 lb 1.6 oz (84.9 kg)  05/12/18 192 lb 12.8 oz (87.5 kg)   General Appearance: Well nourished, in no apparent distress. Eyes: PERRLA, EOMs, conjunctiva no swelling or erythema Sinuses: No Frontal/maxillary tenderness ENT/Mouth: Ext aud canals clear, TMs without erythema, bulging. No erythema, swelling, or exudate on post pharynx.  Tonsils not swollen or erythematous. Hearing normal.  Neck: Supple, thyroid normal.  Respiratory: Respiratory effort normal, BS equal bilaterally without rales, rhonchi, wheezing or stridor.   Cardio: RRR with no MRGs. Brisk peripheral pulses without edema.  Abdomen: Soft, + BS.  Non tender, no guarding, rebound, hernias, masses. Lymphatics: Non tender without lymphadenopathy.  Musculoskeletal: Full ROM, 5/5 strength, Normal gait Skin: Warm, dry without rashes, lesions, ecchymosis.  Neuro: Cranial nerves intact. No cerebellar symptoms.  Psych: Awake and oriented X 3, normal affect, Insight and Judgment appropriate.    Vicie Mutters, PA-C 12:11 PM [facility]

## 2018-11-17 ENCOUNTER — Encounter: Payer: Self-pay | Admitting: Physician Assistant

## 2018-11-17 ENCOUNTER — Ambulatory Visit: Payer: BLUE CROSS/BLUE SHIELD | Admitting: Physician Assistant

## 2018-11-17 ENCOUNTER — Other Ambulatory Visit: Payer: Self-pay

## 2018-11-17 VITALS — BP 114/74 | HR 88 | Temp 97.5°F | Ht 68.0 in | Wt 192.6 lb

## 2018-11-17 DIAGNOSIS — I10 Essential (primary) hypertension: Secondary | ICD-10-CM | POA: Diagnosis not present

## 2018-11-17 DIAGNOSIS — Z130 Encounter for screening for diseases of the blood and blood-forming organs and certain disorders involving the immune mechanism: Secondary | ICD-10-CM | POA: Diagnosis not present

## 2018-11-17 DIAGNOSIS — Z79899 Other long term (current) drug therapy: Secondary | ICD-10-CM

## 2018-11-17 DIAGNOSIS — E782 Mixed hyperlipidemia: Secondary | ICD-10-CM | POA: Diagnosis not present

## 2018-11-17 DIAGNOSIS — R7309 Other abnormal glucose: Secondary | ICD-10-CM

## 2018-11-17 DIAGNOSIS — E291 Testicular hypofunction: Secondary | ICD-10-CM

## 2018-11-17 DIAGNOSIS — G43009 Migraine without aura, not intractable, without status migrainosus: Secondary | ICD-10-CM

## 2018-11-17 DIAGNOSIS — E559 Vitamin D deficiency, unspecified: Secondary | ICD-10-CM | POA: Diagnosis not present

## 2018-11-17 MED ORDER — NURTEC 75 MG PO TBDP: 75.0000 mg | ORAL_TABLET | ORAL | 0 refills | Status: DC | PRN

## 2018-11-17 MED ORDER — BUTALBITAL-APAP-CAFFEINE 50-325-40 MG PO TABS: ORAL_TABLET | ORAL | 0 refills | Status: DC

## 2018-11-17 NOTE — Patient Instructions (Signed)
Take flexeril at night every night for a week to see if this helps prevent the migraines.  Can take nurtec as needed.   Can do zinc 40-50 mg a day Can try shake that is whey protein, almond milk, avocado oil, and spinach and strawberries in the morning  9 Ways to Naturally Increase Testosterone Levels  1.   Lose Weight If you're overweight, shedding the excess pounds may increase your testosterone levels, according to research presented at the Endocrine Society's 2012 meeting. Overweight men are more likely to have low testosterone levels to begin with, so this is an important trick to increase your body's testosterone production when you need it most.  2.   High-Intensity Exercise like Peak Fitness  Short intense exercise has a proven positive effect on increasing testosterone levels and preventing its decline. That's unlike aerobics or prolonged moderate exercise, which have shown to have negative or no effect on testosterone levels. Having a whey protein meal after exercise can further enhance the satiety/testosterone-boosting impact (hunger hormones cause the opposite effect on your testosterone and libido). Here's a summary of what a typical high-intensity Peak Fitness routine might look like: " Warm up for three minutes  " Exercise as hard and fast as you can for 30 seconds. You should feel like you couldn't possibly go on another few seconds  " Recover at a slow to moderate pace for 90 seconds  " Repeat the high intensity exercise and recovery 7 more times .  3.   Consume Plenty of Zinc The mineral zinc is important for testosterone production, and supplementing your diet for as little as six weeks has been shown to cause a marked improvement in testosterone among men with low levels.1 Likewise, research has shown that restricting dietary sources of zinc leads to a significant decrease in testosterone, while zinc supplementation increases it2 -- and even protects men from exercised-induced  reductions in testosterone levels.3 It's estimated that up to 41 percent of adults over the age of 60 may have lower than recommended zinc intakes; even when dietary supplements were added in, an estimated 20-25 percent of older adults still had inadequate zinc intakes, according to a [employer] and Nutrition Examination Survey.4 Your diet is the best source of zinc; along with protein-rich foods like meats and fish, other good dietary sources of zinc include raw milk, raw cheese, beans, and yogurt or kefir made from raw milk. It can be difficult to obtain enough dietary zinc if you're a vegetarian, and also for meat-eaters as well, largely because of conventional farming methods that rely heavily on chemical fertilizers and pesticides. These chemicals deplete the soil of nutrients ... nutrients like zinc that must be absorbed by plants in order to be passed on to you. In many cases, you may further deplete the nutrients in your food by the way you prepare it. For most food, cooking it will drastically reduce its levels of nutrients like zinc ... particularly over-cooking, which many people do. If you decide to use a zinc supplement, stick to a dosage of less than 40 mg a day, as this is the recommended adult upper limit. Taking too much zinc can interfere with your body's ability to absorb other minerals, especially copper, and may cause nausea as a side effect.  4.   Strength Training In addition to Peak Fitness, strength training is also known to boost testosterone levels, provided you are doing so intensely enough. When strength training to boost testosterone, you'll want to increase  the weight and lower your number of reps, and then focus on exercises that work a large number of muscles, such as dead lifts or squats.  You can "turbo-charge" your weight training by going slower. By slowing down your movement, you're actually turning it into a high-intensity exercise. Super Slow movement allows your  muscle, at the microscopic level, to access the maximum number of cross-bridges between the protein filaments that produce movement in the muscle.   5.   Optimize Your Vitamin D Levels Vitamin D, a steroid hormone, is essential for the healthy development of the nucleus of the sperm cell, and helps maintain semen quality and sperm count. Vitamin D also increases levels of testosterone, which may boost libido. In one study, overweight men who were given vitamin D supplements had a significant increase in testosterone levels after one year.5   6.   Reduce Stress When you're under a lot of stress, your body releases high levels of the stress hormone cortisol. This hormone actually blocks the effects of testosterone,6 presumably because, from a biological standpoint, testosterone-associated behaviors (mating, competing, aggression) may have lowered your chances of survival in an emergency (hence, the "fight or flight" response is dominant, courtesy of cortisol).  7.   Limit or Eliminate Sugar from Your Diet Testosterone levels decrease after you eat sugar, which is likely because the sugar leads to a high insulin level, another factor leading to low testosterone.7 Based on USDA estimates, the average American consumes 12 teaspoons of sugar a day, which equates to about TWO TONS of sugar during a lifetime.  8.   Eat Healthy Fats By healthy, this means not only mon- and polyunsaturated fats, like that found in avocadoes and nuts, but also saturated, as these are essential for building testosterone. Research shows that a diet with less than 40 percent of energy as fat (and that mainly from animal sources, i.e. saturated) lead to a decrease in testosterone levels.8 My personal diet is about 60-70 percent healthy fat, and other experts agree that the ideal diet includes somewhere between 50-70 percent fat.  It's important to understand that your body requires saturated fats from animal and vegetable sources  (such as meat, dairy, certain oils, and tropical plants like coconut) for optimal functioning, and if you neglect this important food group in favor of sugar, grains and other starchy carbs, your health and weight are almost guaranteed to suffer. Examples of healthy fats you can eat more of to give your testosterone levels a boost include: Olives and Olive oil  Coconuts and coconut oil Butter made from raw grass-fed organic milk Raw nuts, such as, almonds or pecans Organic pastured egg yolks Avocados Grass-fed meats Palm oil Unheated organic nut oils   9.   Boost Your Intake of Branch Chain Amino Acids (BCAA) from Foods Like [place] suggests that BCAAs result in higher testosterone levels, particularly when taken along with resistance training.9 While BCAAs are available in supplement form, you'll find the highest concentrations of BCAAs like leucine in dairy products - especially quality cheeses and whey protein. Even when getting leucine from your natural food supply, it's often wasted or used as a building block instead of an anabolic agent. So to create the correct anabolic environment, you need to boost leucine consumption way beyond mere maintenance levels. That said, keep in mind that using leucine as a free form amino acid can be highly counterproductive as when free form amino acids are artificially administrated, they rapidly enter your circulation  while disrupting insulin function, and impairing your body's glycemic control. Food-based leucine is really the ideal form that can benefit your muscles without side effects.

## 2018-11-18 LAB — CBC WITH DIFFERENTIAL/PLATELET
Absolute Monocytes: 429 cells/uL (ref 200–950)
Basophils Absolute: 61 cells/uL (ref 0–200)
Basophils Relative: 1.6 %
Eosinophils Absolute: 388 cells/uL (ref 15–500)
Eosinophils Relative: 10.2 %
HCT: 44.7 % (ref 38.5–50.0)
Hemoglobin: 14.9 g/dL (ref 13.2–17.1)
Lymphs Abs: 1357 cells/uL (ref 850–3900)
MCH: 28.8 pg (ref 27.0–33.0)
MCHC: 33.3 g/dL (ref 32.0–36.0)
MCV: 86.3 fL (ref 80.0–100.0)
MPV: 10 fL (ref 7.5–12.5)
Monocytes Relative: 11.3 %
Neutro Abs: 1566 cells/uL (ref 1500–7800)
Neutrophils Relative %: 41.2 %
Platelets: 241 10*3/uL (ref 140–400)
RBC: 5.18 10*6/uL (ref 4.20–5.80)
RDW: 13.1 % (ref 11.0–15.0)
Total Lymphocyte: 35.7 %
WBC: 3.8 10*3/uL (ref 3.8–10.8)

## 2018-11-18 LAB — IRON, TOTAL/TOTAL IRON BINDING CAP
%SAT: 36 % (calc) (ref 20–48)
Iron: 150 ug/dL (ref 50–180)
TIBC: 417 mcg/dL (calc) (ref 250–425)

## 2018-11-18 LAB — COMPLETE METABOLIC PANEL WITH GFR
AG Ratio: 2.3 (calc) (ref 1.0–2.5)
ALT: 23 U/L (ref 9–46)
AST: 24 U/L (ref 10–35)
Albumin: 4.6 g/dL (ref 3.6–5.1)
Alkaline phosphatase (APISO): 84 U/L (ref 35–144)
BUN: 17 mg/dL (ref 7–25)
CO2: 27 mmol/L (ref 20–32)
Calcium: 9.8 mg/dL (ref 8.6–10.3)
Chloride: 105 mmol/L (ref 98–110)
Creat: 0.92 mg/dL (ref 0.70–1.25)
GFR, Est African American: 103 mL/min/{1.73_m2} (ref >=60)
GFR, Est Non African American: 89 mL/min/{1.73_m2} (ref >=60)
Globulin: 2 g/dL (calc) (ref 1.9–3.7)
Glucose, Bld: 90 mg/dL (ref 65–99)
Potassium: 4.5 mmol/L (ref 3.5–5.3)
Sodium: 139 mmol/L (ref 135–146)
Total Bilirubin: 0.5 mg/dL (ref 0.2–1.2)
Total Protein: 6.6 g/dL (ref 6.1–8.1)

## 2018-11-18 LAB — LIPID PANEL
Cholesterol: 152 mg/dL (ref <200)
HDL: 51 mg/dL (ref >=40)
LDL Cholesterol (Calc): 79 mg/dL (calc)
Non-HDL Cholesterol (Calc): 101 mg/dL (calc) (ref <130)
Total CHOL/HDL Ratio: 3 (calc) (ref <5.0)
Triglycerides: 122 mg/dL (ref <150)

## 2018-11-18 LAB — TESTOSTERONE: Testosterone: 422 ng/dL (ref 250–827)

## 2018-11-18 LAB — VITAMIN D 25 HYDROXY (VIT D DEFICIENCY, FRACTURES): Vit D, 25-Hydroxy: 104 ng/mL — ABNORMAL HIGH (ref 30–100)

## 2018-11-18 LAB — TSH: TSH: 1.5 mIU/L (ref 0.40–4.50)

## 2018-11-18 LAB — VITAMIN B12: Vitamin B-12: 1060 pg/mL (ref 200–1100)

## 2018-11-18 LAB — MAGNESIUM: Magnesium: 2.2 mg/dL (ref 1.5–2.5)

## 2018-12-14 ENCOUNTER — Telehealth: Payer: Self-pay | Admitting: Physician Assistant

## 2018-12-14 DIAGNOSIS — G43009 Migraine without aura, not intractable, without status migrainosus: Secondary | ICD-10-CM

## 2018-12-14 MED ORDER — NURTEC 75 MG PO TBDP: 75.0000 mg | ORAL_TABLET | ORAL | 0 refills | Status: DC | PRN

## 2018-12-14 NOTE — Telephone Encounter (Signed)
-----   Message from Elenor Quinones, [place] sent at 12/13/2018  9:36 AM EDT ----- Regarding: OFFICE NOTE/ REQUEST MEDS FOR HEADACHES Contact: [phone] PER OFFICE patient has requested MEDS for headaches.      [facility]

## 2018-12-14 NOTE — Telephone Encounter (Signed)
Patient needs to have discount card available, can get online if he does not have for Nurtec We may have to get a PA

## 2019-01-09 ENCOUNTER — Other Ambulatory Visit: Payer: Self-pay | Admitting: Adult Health

## 2019-01-12 ENCOUNTER — Other Ambulatory Visit: Payer: Self-pay | Admitting: Adult Health

## 2019-01-25 DIAGNOSIS — H5213 Myopia, bilateral: Secondary | ICD-10-CM | POA: Diagnosis not present

## 2019-01-25 DIAGNOSIS — H31002 Unspecified chorioretinal scars, left eye: Secondary | ICD-10-CM | POA: Diagnosis not present

## 2019-02-02 ENCOUNTER — Other Ambulatory Visit: Payer: Self-pay | Admitting: Physician Assistant

## 2019-02-24 ENCOUNTER — Encounter: Payer: Self-pay | Admitting: Internal Medicine

## 2019-03-20 ENCOUNTER — Other Ambulatory Visit: Payer: Self-pay

## 2019-03-20 MED ORDER — MELOXICAM 15 MG PO TABS: 15.0000 mg | ORAL_TABLET | ORAL | 1 refills | Status: DC | PRN

## 2019-03-28 ENCOUNTER — Other Ambulatory Visit: Payer: Self-pay | Admitting: Adult Health

## 2019-04-26 ENCOUNTER — Encounter: Payer: Self-pay | Admitting: Internal Medicine

## 2019-04-26 NOTE — Progress Notes (Signed)
Annual  Screening/Preventative Visit  & Comprehensive Evaluation & Examination     This very nice 64 y.o. [name] presents for a Screening /Preventative Visit & comprehensive evaluation and management of multiple medical co-morbidities.  Patient has been followed for HTN, HLD, Prediabetes and Vitamin D Deficiency.     HTN predates since 1999. Patient's BP has been controlled at home.  Today's BP is at goal - 112/74.  Patient had a Negative Heart Cath in 2008 after a False Positive Myoview. Patient denies any cardiac symptoms as chest pain, palpitations, shortness of breath, dizziness or ankle swelling.     Patient's hyperlipidemia is controlled with diet and Rosuvastatin. Patient denies myalgias or other medication SE's. Last lipids were at goal:  Lab Results  Component Value Date   CHOL 150 04/27/2019   HDL 55 04/27/2019   LDLCALC 77 04/27/2019   TRIG 98 04/27/2019   CHOLHDL 2.7 04/27/2019       Patient has hx/o prediabetes (A1c 5.7% / 2011 &5.4%/2014) and patient denies reactive hypoglycemic symptoms, visual blurring, diabetic polys or paresthesias. Last A1c was Normal & at goal:  Lab Results  Component Value Date   HGBA1C 5.6 04/27/2019        Patient has hx/o Testosterone Deficiency since 2005 & has been on gel replacement ever since & reports improved stamina & sense of well being.      Finally, patient has history of Vitamin D Deficiency ("48" on Tx in 2008)  and last vitamin D was at goal:  Lab Results  Component Value Date   VD25OH 54 04/27/2019    Current Outpatient Medications on File Prior to Visit  Medication Sig  . ALPRAZolam (XANAX) 0.5 MG tablet TAKE 1/2-1 TABLET BY MOUTH 2-3 TIMES DAILY AS NEEDED FOR ANXIETY ATTACK. LIMIT TO 5 DAYS/WEEK.  . Ascorbic Acid (VITAMIN C) 1000 MG tablet Take 1,000 mg by mouth 2 (two) times daily.  . butalbital-acetaminophen-caffeine (FIORICET) 50-325-40 MG tablet Take 1 tablet every 4 to 6 hours ONLY if needed for Severe Headache    . Cholecalciferol (VITAMIN D-3) 5000 UNITS TABS Take 5,000 Units by mouth daily.   . clobetasol cream (TEMOVATE) 0.05 % APPLY TO RASH TWICE DAILY AS NEEDED  . cyclobenzaprine (FLEXERIL) 10 MG tablet Take 10 mg by mouth as needed for muscle spasms.  . Flaxseed, Linseed, (FLAX SEED OIL PO) Take 1,000 mg by mouth 3 (three) times daily.   . Glucosamine-Chondroitin (GLUCOSAMINE CHONDR COMPLEX PO) Take by mouth. Take 2 tablets daily  . Magnesium 500 MG TABS Take 2 tablets by mouth daily.  . Melatonin 10 MG CAPS Take by mouth Nightly.  . meloxicam (MOBIC) 15 MG tablet Take 1 tablet (15 mg total) by mouth as needed for pain.  . moexipril (UNIVASC) 15 MG tablet TAKE 1 TABLET DAILY FOR    BLOOD PRESSURE  . Omega-3 Fatty Acids (FISH OIL PO) Take 1,000 mg by mouth 3 (three) times daily.   . pregabalin (LYRICA) 200 MG capsule Take 200 mg by mouth 2 (two) times daily.  . Probiotic Product (ALIGN) 4 MG CAPS Take 1 capsule daily for Probiotic Benefit  . Rimegepant Sulfate (NURTEC) 75 MG TBDP Take 75 mg by mouth as needed (migraine).  . rosuvastatin (CRESTOR) 40 MG tablet Take 1 tablet (40 mg total) by mouth daily.  . tadalafil (CIALIS) 5 MG tablet TAKE 1 TABLET DAILY FOR    PROSTATISM  . TAZORAC 0.05 % cream as needed.   . Testosterone 10 MG/ACT (2%)  GEL Apply 4 pumps daily to Thighs  . vitamin B-12 (CYANOCOBALAMIN) 100 MCG tablet Take 100 mcg by mouth daily.  Marland Kitchen zolmitriptan (ZOMIG) 5 MG tablet as needed.    No current facility-administered medications on file prior to visit.   Past Medical History:  Diagnosis Date  . Hyperlipidemia   . Hypertension   . Pre-diabetes    no meds  . Vitamin D deficiency    Health Maintenance  Topic Date Due  . Hepatitis C Screening  Apr 27, 1955  . HIV Screening  05/11/1971  . TETANUS/TDAP  06/16/2018  . COLONOSCOPY  08/01/2024  . INFLUENZA VACCINE  Completed   Immunization History  Administered Date(s) Administered  . Influenza Inj Mdck Quad Pf 01/04/2019  .  Influenza Inj Mdck Quad With Preservative 12/27/2017  . Influenza Split 01/08/2014  . Influenza Whole 01/05/2013  . Influenza-Unspecified 01/05/2015  . PPD Test 01/08/2014, 01/14/2015, 12/12/2015, 01/12/2017, 02/08/2018, 04/27/2019  . Tdap 06/15/2008   Last Colon - 08/02/2014 - dr Fuller Plan - Recc 10 year f/u due May 2026  Past Surgical History:  Procedure Laterality Date  . ESOPHAGOGASTRODUODENOSCOPY    . KNEE SURGERY     left  . ORIF ACETABULAR FRACTURE     left arm 2 times   Family History  Problem Relation Age of Onset  . Hypertension Mother   . Hyperlipidemia Mother   . Diabetes Mother   . Hypertension Father    Social History   Socioeconomic History  . Marital status: Married    Spouse name: Sharee Pimple  . Number of children: 1  Daughter & 2 step-children  Occupational History  . Systems analyst at [employer]  Tobacco Use  . Smoking status: Never Smoker  . Smokeless tobacco: Never Used  Substance and Sexual Activity  . Alcohol use: Yes    Alcohol/week: 2.0 - 5.0 standard drinks    Types: 2 - 5 Standard drinks or equivalent per week  . Drug use: No  . Sexual activity: Active    ROS Constitutional: Denies fever, chills, weight loss/gain, headaches, insomnia,  night sweats or change in appetite. Does c/o fatigue. Eyes: Denies redness, blurred vision, diplopia, discharge, itchy or watery eyes.  ENT: Denies discharge, congestion, post nasal drip, epistaxis, sore throat, earache, hearing loss, dental pain, Tinnitus, Vertigo, Sinus pain or snoring.  Cardio: Denies chest pain, palpitations, irregular heartbeat, syncope, dyspnea, diaphoresis, orthopnea, PND, claudication or edema Respiratory: denies cough, dyspnea, DOE, pleurisy, hoarseness, laryngitis or wheezing.  Gastrointestinal: Denies dysphagia, heartburn, reflux, water brash, pain, cramps, nausea, vomiting, bloating, diarrhea, constipation, hematemesis, melena, hematochezia, jaundice or hemorrhoids Genitourinary: Denies  dysuria, frequency, urgency, nocturia, hesitancy, discharge, hematuria or flank pain Musculoskeletal: Denies arthralgia, myalgia, stiffness, Jt. Swelling, pain, limp or strain/sprain. Denies Falls. Skin: Denies puritis, rash, hives, warts, acne, eczema or change in skin lesion Neuro: No weakness, tremor, incoordination, spasms, paresthesia or pain Psychiatric: Denies confusion, memory loss or sensory loss. Denies Depression. Endocrine: Denies change in weight, skin, hair change, nocturia, and paresthesia, diabetic polys, visual blurring or hyper / hypo glycemic episodes.  Heme/Lymph: No excessive bleeding, bruising or enlarged lymph nodes.  Physical Exam  BP 112/74   Pulse 72   Temp (!) 97 F (36.1 C)   Resp 16   Ht 5\' 8"  (1.727 m)   Wt 194 lb 9.6 oz (88.3 kg)   BMI 29.59 kg/m   General Appearance: Well nourished and well groomed and in no apparent distress.  Eyes: PERRLA, EOMs, conjunctiva no swelling or erythema, normal fundi  and vessels. Sinuses: No frontal/maxillary tenderness ENT/Mouth: EACs patent / TMs  nl. Nares clear without erythema, swelling, mucoid exudates. Oral hygiene is good. No erythema, swelling, or exudate. Tongue normal, non-obstructing. Tonsils not swollen or erythematous. Hearing normal.  Neck: Supple, thyroid not palpable. No bruits, nodes or JVD. Respiratory: Respiratory effort normal.  BS equal and clear bilateral without rales, rhonci, wheezing or stridor. Cardio: Heart sounds are normal with regular rate and rhythm and no murmurs, rubs or gallops. Peripheral pulses are normal and equal bilaterally without edema. No aortic or femoral bruits. Chest: symmetric with normal excursions and percussion.  Abdomen: Soft, with Nl bowel sounds. Nontender, no guarding, rebound, hernias, masses, or organomegaly.  Lymphatics: Non tender without lymphadenopathy.  Musculoskeletal: Full ROM all peripheral extremities, joint stability, 5/5 strength, and normal gait. Skin: Warm  and dry without rashes, lesions, cyanosis, clubbing or  ecchymosis.  Neuro: Cranial nerves intact, reflexes equal bilaterally. Normal muscle tone, no cerebellar symptoms. Sensation intact.  Pysch: Alert and oriented X 3 with normal affect, insight and judgment appropriate.   Assessment and Plan  1. Annual Preventative/Screening Exam   2. Essential hypertension  - EKG 12-Lead - [place], RETROPERITNL ABD,  LTD - Urinalysis, Routine w reflex microscopic - Microalbumin / creatinine urine ratio - CBC with Differential/Platelet - COMPLETE METABOLIC PANEL WITH GFR - Magnesium - TSH  3. Hyperlipidemia, mixed  - EKG 12-Lead - [place], RETROPERITNL ABD,  LTD - Lipid panel - TSH  4. Abnormal glucose  - EKG 12-Lead - [place], RETROPERITNL ABD,  LTD - Hemoglobin A1c - Insulin, random  5. Vitamin D deficiency  - VITAMIN D 25 Hydroxy   6. Testosterone deficiency  - Testosterone  7. Prediabetes  - EKG 12-Lead - [place], RETROPERITNL ABD,  LTD - Hemoglobin A1c - Insulin, random  8. BPH with obstruction/lower urinary tract symptoms  - PSA  9. Screening for colorectal cancer  - POC Hemoccult Bld/Stl   10. Screening examination for pulmonary tuberculosis  - TB Skin Test  11. Prostate cancer screening  - PSA  12. Screening for ischemic heart disease  - EKG 12-Lead  13. FH: hypertension  - EKG 12-Lead - [place], RETROPERITNL ABD,  LTD  14. Screening for AAA (aortic abdominal aneurysm)  - [place], RETROPERITNL ABD,  LTD  15. Fatigu  - Iron,Total/Total Iron Binding Cap - Vitamin B12 - CBC with Differential/Platelet - TSH  16. Medication management  - Urinalysis, Routine w reflex microscopic - Microalbumin / creatinine urine ratio - Testosterone - CBC with Differential/Platelet - COMPLETE METABOLIC PANEL WITH GFR - Magnesium - Lipid panel - TSH - Hemoglobin A1c - Insulin, random - VITAMIN D 25 Hydroxy         Patient was counseled in prudent diet, weight control to  achieve/maintain BMI less than 25, BP monitoring, regular exercise and medications as discussed.  Discussed med effects and SE's. Routine screening labs and tests as requested with regular follow-up as recommended. Over 40 minutes of exam, counseling, chart review and high complex critical decision making was performed   Kirtland Bouchard, MD

## 2019-04-26 NOTE — Patient Instructions (Signed)

## 2019-04-27 ENCOUNTER — Ambulatory Visit: Payer: BLUE CROSS/BLUE SHIELD | Admitting: Internal Medicine

## 2019-04-27 ENCOUNTER — Other Ambulatory Visit: Payer: Self-pay

## 2019-04-27 VITALS — BP 112/74 | HR 72 | Temp 97.0°F | Resp 16 | Ht 68.0 in | Wt 194.6 lb

## 2019-04-27 DIAGNOSIS — Z130 Encounter for screening for diseases of the blood and blood-forming organs and certain disorders involving the immune mechanism: Secondary | ICD-10-CM

## 2019-04-27 DIAGNOSIS — Z111 Encounter for screening for respiratory tuberculosis: Secondary | ICD-10-CM | POA: Diagnosis not present

## 2019-04-27 DIAGNOSIS — Z79899 Other long term (current) drug therapy: Secondary | ICD-10-CM

## 2019-04-27 DIAGNOSIS — N401 Enlarged prostate with lower urinary tract symptoms: Secondary | ICD-10-CM | POA: Diagnosis not present

## 2019-04-27 DIAGNOSIS — Z131 Encounter for screening for diabetes mellitus: Secondary | ICD-10-CM | POA: Diagnosis not present

## 2019-04-27 DIAGNOSIS — Z1329 Encounter for screening for other suspected endocrine disorder: Secondary | ICD-10-CM | POA: Diagnosis not present

## 2019-04-27 DIAGNOSIS — Z8249 Family history of ischemic heart disease and other diseases of the circulatory system: Secondary | ICD-10-CM | POA: Diagnosis not present

## 2019-04-27 DIAGNOSIS — R350 Frequency of micturition: Secondary | ICD-10-CM | POA: Diagnosis not present

## 2019-04-27 DIAGNOSIS — R5383 Other fatigue: Secondary | ICD-10-CM

## 2019-04-27 DIAGNOSIS — N138 Other obstructive and reflux uropathy: Secondary | ICD-10-CM

## 2019-04-27 DIAGNOSIS — Z0000 Encounter for general adult medical examination without abnormal findings: Secondary | ICD-10-CM

## 2019-04-27 DIAGNOSIS — Z136 Encounter for screening for cardiovascular disorders: Secondary | ICD-10-CM | POA: Diagnosis not present

## 2019-04-27 DIAGNOSIS — E782 Mixed hyperlipidemia: Secondary | ICD-10-CM

## 2019-04-27 DIAGNOSIS — E559 Vitamin D deficiency, unspecified: Secondary | ICD-10-CM

## 2019-04-27 DIAGNOSIS — Z1211 Encounter for screening for malignant neoplasm of colon: Secondary | ICD-10-CM

## 2019-04-27 DIAGNOSIS — Z125 Encounter for screening for malignant neoplasm of prostate: Secondary | ICD-10-CM | POA: Diagnosis not present

## 2019-04-27 DIAGNOSIS — Z13220 Encounter for screening for lipoid disorders: Secondary | ICD-10-CM

## 2019-04-27 DIAGNOSIS — E349 Endocrine disorder, unspecified: Secondary | ICD-10-CM

## 2019-04-27 DIAGNOSIS — R7303 Prediabetes: Secondary | ICD-10-CM

## 2019-04-27 DIAGNOSIS — I10 Essential (primary) hypertension: Secondary | ICD-10-CM | POA: Diagnosis not present

## 2019-04-27 DIAGNOSIS — Z0001 Encounter for general adult medical examination with abnormal findings: Secondary | ICD-10-CM

## 2019-04-27 DIAGNOSIS — Z1212 Encounter for screening for malignant neoplasm of rectum: Secondary | ICD-10-CM

## 2019-04-27 DIAGNOSIS — Z1389 Encounter for screening for other disorder: Secondary | ICD-10-CM

## 2019-04-27 DIAGNOSIS — R7309 Other abnormal glucose: Secondary | ICD-10-CM

## 2019-04-28 LAB — CBC WITH DIFFERENTIAL/PLATELET
Absolute Monocytes: 504 {cells}/uL (ref 200–950)
Basophils Absolute: 62 {cells}/uL (ref 0–200)
Basophils Relative: 1.5 %
Eosinophils Absolute: 234 {cells}/uL (ref 15–500)
Eosinophils Relative: 5.7 %
HCT: 42.5 % (ref 38.5–50.0)
Hemoglobin: 13.8 g/dL (ref 13.2–17.1)
Lymphs Abs: 1091 {cells}/uL (ref 850–3900)
MCH: 27.8 pg (ref 27.0–33.0)
MCHC: 32.5 g/dL (ref 32.0–36.0)
MCV: 85.7 fL (ref 80.0–100.0)
MPV: 9.9 fL (ref 7.5–12.5)
Monocytes Relative: 12.3 %
Neutro Abs: 2210 {cells}/uL (ref 1500–7800)
Neutrophils Relative %: 53.9 %
Platelets: 292 Thousand/uL (ref 140–400)
RBC: 4.96 Million/uL (ref 4.20–5.80)
RDW: 13.2 % (ref 11.0–15.0)
Total Lymphocyte: 26.6 %
WBC: 4.1 Thousand/uL (ref 3.8–10.8)

## 2019-04-28 LAB — COMPLETE METABOLIC PANEL WITH GFR
AG Ratio: 2.1 (calc) (ref 1.0–2.5)
ALT: 22 U/L (ref 9–46)
AST: 29 U/L (ref 10–35)
Albumin: 4.4 g/dL (ref 3.6–5.1)
Alkaline phosphatase (APISO): 77 U/L (ref 35–144)
BUN/Creatinine Ratio: 28 (calc) — ABNORMAL HIGH (ref 6–22)
BUN: 31 mg/dL — ABNORMAL HIGH (ref 7–25)
CO2: 29 mmol/L (ref 20–32)
Calcium: 9.6 mg/dL (ref 8.6–10.3)
Chloride: 107 mmol/L (ref 98–110)
Creat: 1.09 mg/dL (ref 0.70–1.25)
GFR, Est African American: 84 mL/min/{1.73_m2} (ref >=60)
GFR, Est Non African American: 72 mL/min/{1.73_m2} (ref >=60)
Globulin: 2.1 g/dL (calc) (ref 1.9–3.7)
Glucose, Bld: 67 mg/dL (ref 65–99)
Potassium: 4.6 mmol/L (ref 3.5–5.3)
Sodium: 141 mmol/L (ref 135–146)
Total Bilirubin: 0.4 mg/dL (ref 0.2–1.2)
Total Protein: 6.5 g/dL (ref 6.1–8.1)

## 2019-04-28 LAB — INSULIN, RANDOM: Insulin: 20.2 u[IU]/mL — ABNORMAL HIGH

## 2019-04-28 LAB — URINALYSIS, ROUTINE W REFLEX MICROSCOPIC
Bacteria, UA: NONE SEEN /HPF
Bilirubin Urine: NEGATIVE
Glucose, UA: NEGATIVE
Hgb urine dipstick: NEGATIVE
Hyaline Cast: NONE SEEN /LPF
Leukocytes,Ua: NEGATIVE
Nitrite: NEGATIVE
RBC / HPF: NONE SEEN /HPF (ref 0–2)
Specific Gravity, Urine: 1.027 (ref 1.001–1.03)
Squamous Epithelial / HPF: NONE SEEN /HPF (ref <=5)
WBC, UA: NONE SEEN /HPF (ref 0–5)
pH: 6.5 (ref 5.0–8.0)

## 2019-04-28 LAB — IRON, TOTAL/TOTAL IRON BINDING CAP
%SAT: 15 % (calc) — ABNORMAL LOW (ref 20–48)
Iron: 65 ug/dL (ref 50–180)
TIBC: 446 mcg/dL (calc) — ABNORMAL HIGH (ref 250–425)

## 2019-04-28 LAB — LIPID PANEL
Cholesterol: 150 mg/dL (ref <200)
HDL: 55 mg/dL (ref >=40)
LDL Cholesterol (Calc): 77 mg/dL
Non-HDL Cholesterol (Calc): 95 mg/dL (ref <130)
Total CHOL/HDL Ratio: 2.7 (calc) (ref <5.0)
Triglycerides: 98 mg/dL (ref <150)

## 2019-04-28 LAB — TSH: TSH: 0.95 m[IU]/L (ref 0.40–4.50)

## 2019-04-28 LAB — HEMOGLOBIN A1C
Hgb A1c MFr Bld: 5.6 %{Hb} (ref <5.7)
Mean Plasma Glucose: 114 (calc)
eAG (mmol/L): 6.3 (calc)

## 2019-04-28 LAB — VITAMIN B12: Vitamin B-12: 790 pg/mL (ref 200–1100)

## 2019-04-28 LAB — MICROALBUMIN / CREATININE URINE RATIO
Creatinine, Urine: 185 mg/dL (ref 20–320)
Microalb Creat Ratio: 27 mcg/mg creat (ref <30)
Microalb, Ur: 5 mg/dL

## 2019-04-28 LAB — VITAMIN D 25 HYDROXY (VIT D DEFICIENCY, FRACTURES): Vit D, 25-Hydroxy: 54 ng/mL (ref 30–100)

## 2019-04-28 LAB — TESTOSTERONE: Testosterone: 761 ng/dL (ref 250–827)

## 2019-04-28 LAB — MAGNESIUM: Magnesium: 2.5 mg/dL (ref 1.5–2.5)

## 2019-04-28 LAB — PSA: PSA: 3.2 ng/mL (ref <=4.0)

## 2019-05-02 LAB — TB SKIN TEST
Induration: 0 mm
TB Skin Test: NEGATIVE

## 2019-05-09 DIAGNOSIS — G43719 Chronic migraine without aura, intractable, without status migrainosus: Secondary | ICD-10-CM | POA: Diagnosis not present

## 2019-05-09 DIAGNOSIS — G43019 Migraine without aura, intractable, without status migrainosus: Secondary | ICD-10-CM | POA: Diagnosis not present

## 2019-05-31 ENCOUNTER — Other Ambulatory Visit: Payer: Self-pay | Admitting: Internal Medicine

## 2019-05-31 DIAGNOSIS — E349 Endocrine disorder, unspecified: Secondary | ICD-10-CM

## 2019-06-01 ENCOUNTER — Telehealth: Payer: Self-pay | Admitting: *Deleted

## 2019-06-01 NOTE — Telephone Encounter (Signed)
Per [facility], Testosterone Gel Pump 2 % approved for 3 years.

## 2019-06-12 ENCOUNTER — Other Ambulatory Visit: Payer: Self-pay

## 2019-06-12 DIAGNOSIS — Z1212 Encounter for screening for malignant neoplasm of rectum: Secondary | ICD-10-CM | POA: Diagnosis not present

## 2019-06-12 DIAGNOSIS — Z1211 Encounter for screening for malignant neoplasm of colon: Secondary | ICD-10-CM | POA: Diagnosis not present

## 2019-06-12 LAB — POC HEMOCCULT BLD/STL (HOME/3-CARD/SCREEN)
Card #2 Fecal Occult Blod, POC: NEGATIVE
Card #3 Fecal Occult Blood, POC: NEGATIVE
Fecal Occult Blood, POC: NEGATIVE

## 2019-06-22 ENCOUNTER — Other Ambulatory Visit: Payer: Self-pay | Admitting: *Deleted

## 2019-06-22 MED ORDER — MOEXIPRIL HCL 15 MG PO TABS: 15.0000 mg | ORAL_TABLET | Freq: Every day | ORAL | 1 refills | Status: DC

## 2019-06-27 ENCOUNTER — Other Ambulatory Visit: Payer: Self-pay | Admitting: Adult Health

## 2019-07-20 ENCOUNTER — Other Ambulatory Visit: Payer: Self-pay | Admitting: Internal Medicine

## 2019-07-26 NOTE — Progress Notes (Signed)
FOLLOW UP  Assessment and Plan:   Hypertension Well controlled with current medications  Monitor blood pressure at home; patient to call if consistently greater than 130/80 Continue DASH diet.   Reminder to go to the ER if any CP, SOB, nausea, dizziness, severe HA, changes vision/speech, left arm numbness and tingling and jaw pain.  Cholesterol Currently at goal; continue statin Continue low cholesterol diet and exercise.  Check lipid panel.   Hx of Prediabetes Recent A1Cs at goal Discussed diet/exercise, weight management  Defer A1C; check CMP  Overweight Long discussion about weight loss, diet, and exercise Recommended diet heavy in fruits and veggies and low in animal meats, cheeses, and dairy products, appropriate calorie intake Patient will work on increasing exercise; Will follow up in 3 months  Vitamin D Def Near goal at last visit; continue supplementation to maintain goal of 60-100 Defer Vit D level   Hypogonadism - continue to monitor, states medication is helping with symptoms of low T.   Migraine without aura and without status migrainosus, not intractable -    Ubrelvy 100 mg tabs; take 1 tab with onset of migraine; repeat in 2 hours if needed for max dose of 200 mg/24 hours. Sample.    Continue diet and meds as discussed. Further disposition pending results of labs. Discussed med's effects and SE's.   Over 30 minutes of exam, counseling, chart review, and critical decision making was performed.   Future Appointments  Date Time Provider [place]  10/30/2019 11:30 AM Unk [name], MD [facility] None  05/08/2020 11:00 AM Unk [name], MD [facility] None    ----------------------------------------------------------------------------------------------------------------------  HPI 64 y.o. male  presents for 3 month follow up on hypertension, cholesterol, glucose management, weight and vitamin D deficiency.   He has migraines; doing fairly,  followed by Dr. Orie Rout at headache clinic; reports migraines in clusters/spurts, variable. Will have several then none for 3-4 months. Takes zimig up to twice per week and supplements which fioricet.   He is prescribed xanax, takes 0.25 mg PRN at night for sleep, typically takes only 3-4 times per month, increased with stress. Has some stress r/t 67 year old mother.  BMI is Body mass index is 28.98 kg/m., he has been working on diet and exercise, he runs as weather allows. He monitors weights closely at home. Follows good diet 80+% of the time.  Wt Readings from Last 3 Encounters:  07/27/19 190 lb 9.6 oz (86.5 kg)  04/27/19 194 lb 9.6 oz (88.3 kg)  11/17/18 192 lb 9.6 oz (87.4 kg)   His blood pressure has been controlled at home, today their BP is BP: 126/84  He does workout. He denies chest pain, shortness of breath, dizziness.   He is on cholesterol medication (rosuvastatin 20 mg daily) and denies myalgias. His cholesterol is at goal. The cholesterol last visit was:   Lab Results  Component Value Date   CHOL 150 04/27/2019   HDL 55 04/27/2019   LDLCALC 77 04/27/2019   TRIG 98 04/27/2019   CHOLHDL 2.7 04/27/2019    He has been working on diet and exercise for hx of prediabetes, and denies increased appetite, nausea, paresthesia of the feet, polydipsia, polyuria, visual disturbances and vomiting. Last A1C in the office was:  Lab Results  Component Value Date   HGBA1C 5.6 04/27/2019   Patient is on Vitamin D supplement and near goal:   Lab Results  Component Value Date   VD25OH 54 04/27/2019     He has  a history of testosterone deficiency and is on testosterone replacement (gel). He states that the testosterone helps with his energy, libido, muscle mass. Lab Results  Component Value Date   TESTOSTERONE 761 04/27/2019   He reports mild intermittent LUTs, slow stream, nocturia typically 1-2, rarely up to 4.  Lab Results  Component Value Date   PSA 3.2 04/27/2019   PSA  3.8 02/08/2018   PSA 3.5 01/12/2017     Current Outpatient Medications on File Prior to Visit  Medication Sig Dispense Refill  . ALPRAZolam (XANAX) 0.5 MG tablet Take 1/2 to 1 tablet 2 to 3 x / day  ONLY if needed for Anxiety Attack & please try to limit to 5 days /week to avoid Addiction & Dementia 90 tablet 0  . Ascorbic Acid (VITAMIN C) 1000 MG tablet Take 1,000 mg by mouth 2 (two) times daily.    . butalbital-acetaminophen-caffeine (FIORICET) 50-325-40 MG tablet TAKE 1 TABLET EVERY 4 TO 6 HOURS ONLY IF NEEDED FOR SEVERE HEADACHE 30 tablet 0  . Cholecalciferol (VITAMIN D-3) 5000 UNITS TABS Take 5,000 Units by mouth daily.     . clobetasol cream (TEMOVATE) 0.05 % APPLY TO RASH TWICE DAILY AS NEEDED 60 g 3  . cyclobenzaprine (FLEXERIL) 10 MG tablet Take 10 mg by mouth as needed for muscle spasms.    . Flaxseed, Linseed, (FLAX SEED OIL PO) Take 1,000 mg by mouth 3 (three) times daily.     . Glucosamine-Chondroitin (GLUCOSAMINE CHONDR COMPLEX PO) Take by mouth. Take 2 tablets daily    . Magnesium 500 MG TABS Take 2 tablets by mouth daily.    . Melatonin 10 MG CAPS Take by mouth Nightly.    . meloxicam (MOBIC) 15 MG tablet Take 1 tablet (15 mg total) by mouth as needed for pain. 90 tablet 1  . moexipril (UNIVASC) 15 MG tablet Take 1 tablet (15 mg total) by mouth daily. for blood pressure 90 tablet 1  . Omega-3 Fatty Acids (FISH OIL PO) Take 1,000 mg by mouth 3 (three) times daily.     . pregabalin (LYRICA) 200 MG capsule Take 200 mg by mouth 2 (two) times daily.    . Probiotic Product (ALIGN) 4 MG CAPS Take 1 capsule daily for Probiotic Benefit 90 capsule 1  . rosuvastatin (CRESTOR) 40 MG tablet Take 1 tablet (40 mg total) by mouth daily. 90 tablet 1  . tadalafil (CIALIS) 5 MG tablet TAKE 1 TABLET DAILY FOR    PROSTATISM 90 tablet 3  . TAZORAC 0.05 % cream as needed.     . Testosterone 10 MG/ACT (2%) GEL Apply 4 pumps Daily to Thighs 180 g 1  . vitamin B-12 (CYANOCOBALAMIN) 100 MCG tablet  Take 100 mcg by mouth daily.    Marland Kitchen zolmitriptan (ZOMIG) 5 MG tablet as needed.   0   No current facility-administered medications on file prior to visit.     Allergies: Not on File   Medical History:  Past Medical History:  Diagnosis Date  . Hyperlipidemia   . Hypertension   . Pre-diabetes    no meds  . Vitamin D deficiency    Family history- Reviewed and unchanged Social history- Reviewed and unchanged   Review of Systems:  Review of Systems  Constitutional: Negative for malaise/fatigue and weight loss.  HENT: Negative for congestion, hearing loss, sinus pain, sore throat and tinnitus.   Eyes: Negative for blurred vision and double vision.  Respiratory: Negative for cough, sputum production, shortness of breath and  wheezing.   Cardiovascular: Negative for chest pain, palpitations, orthopnea, claudication and leg swelling.  Gastrointestinal: Negative for abdominal pain, blood in stool, constipation, diarrhea, heartburn, melena, nausea and vomiting.  Genitourinary: Negative.   Musculoskeletal: Negative for joint pain and myalgias.  Skin: Negative for rash.  Neurological: Positive for headaches. Negative for dizziness, tingling, sensory change and weakness.  Endo/Heme/Allergies: Negative for environmental allergies and polydipsia.  Psychiatric/Behavioral: Negative.   All other systems reviewed and are negative.    Physical Exam: BP 126/84   Pulse 74   Temp 97.7 F (36.5 C)   Wt 190 lb 9.6 oz (86.5 kg)   SpO2 97%   BMI 28.98 kg/m  Wt Readings from Last 3 Encounters:  07/27/19 190 lb 9.6 oz (86.5 kg)  04/27/19 194 lb 9.6 oz (88.3 kg)  11/17/18 192 lb 9.6 oz (87.4 kg)   General Appearance: Well nourished, in no apparent distress. Eyes: PERRLA, EOMs, conjunctiva no swelling or erythema Sinuses: No Frontal/maxillary tenderness ENT/Mouth: Ext aud canals clear, TMs without erythema, bulging. No erythema, swelling, or exudate on post pharynx.  Tonsils not swollen or  erythematous. Hearing normal.  Neck: Supple, thyroid normal.  Respiratory: Respiratory effort normal, BS equal bilaterally without rales, rhonchi, wheezing or stridor.  Cardio: RRR with no MRGs. Brisk peripheral pulses without edema.  Abdomen: Soft, + BS.  Non tender, no guarding, rebound, hernias, masses. Lymphatics: Non tender without lymphadenopathy.  Musculoskeletal: Full ROM, 5/5 strength, Normal gait Skin: Warm, dry without rashes, lesions, ecchymosis.  Neuro: Cranial nerves intact. No cerebellar symptoms.  Psych: Awake and oriented X 3, normal affect, Insight and Judgment appropriate.    Izora Ribas, NP 11:34 AM [facility]

## 2019-07-27 ENCOUNTER — Encounter: Payer: Self-pay | Admitting: Adult Health

## 2019-07-27 ENCOUNTER — Other Ambulatory Visit: Payer: Self-pay

## 2019-07-27 ENCOUNTER — Ambulatory Visit: Payer: BLUE CROSS/BLUE SHIELD | Admitting: Adult Health

## 2019-07-27 VITALS — BP 126/84 | HR 74 | Temp 97.7°F | Wt 190.6 lb

## 2019-07-27 DIAGNOSIS — E782 Mixed hyperlipidemia: Secondary | ICD-10-CM

## 2019-07-27 DIAGNOSIS — E291 Testicular hypofunction: Secondary | ICD-10-CM | POA: Diagnosis not present

## 2019-07-27 DIAGNOSIS — I10 Essential (primary) hypertension: Secondary | ICD-10-CM

## 2019-07-27 DIAGNOSIS — N400 Enlarged prostate without lower urinary tract symptoms: Secondary | ICD-10-CM

## 2019-07-27 DIAGNOSIS — R7303 Prediabetes: Secondary | ICD-10-CM

## 2019-07-27 DIAGNOSIS — E663 Overweight: Secondary | ICD-10-CM

## 2019-07-27 DIAGNOSIS — E559 Vitamin D deficiency, unspecified: Secondary | ICD-10-CM

## 2019-07-27 DIAGNOSIS — Z79899 Other long term (current) drug therapy: Secondary | ICD-10-CM

## 2019-07-27 DIAGNOSIS — G43009 Migraine without aura, not intractable, without status migrainosus: Secondary | ICD-10-CM | POA: Diagnosis not present

## 2019-07-27 MED ORDER — UBRELVY 100 MG PO TABS: ORAL_TABLET | ORAL | 0 refills | Status: DC

## 2019-07-27 NOTE — Patient Instructions (Signed)
Goals    . DIET - EAT MORE FRUITS AND VEGETABLES     7+ servings daily     . Exercise 150 min/wk Moderate Activity    . HEMOGLOBIN A1C < 5.7    . LDL CALC < 100      Take 1 tab with onset of migraine; can repeat in 2 hours if needed, max 200 mg in 24 hours    Ubrogepant tablets What is this medicine? UBROGEPANT (ue BROE je pant) is used to treat migraine headaches with or without aura. An aura is a strange feeling or visual disturbance that warns you of an attack. It is not used to prevent migraines. This medicine may be used for other purposes; ask your health care provider or pharmacist if you have questions. COMMON BRAND NAME(S): Roselyn Meier What should I tell my health care provider before I take this medicine? They need to know if you have any of these conditions:  kidney disease  liver disease  an unusual or allergic reaction to ubrogepant, other medicines, foods, dyes, or preservatives  pregnant or trying to get pregnant  breast-feeding How should I use this medicine? Take this medicine by mouth with a glass of water. Follow the directions on the prescription label. You can take it with or without food. If it upsets your stomach, take it with food. Take your medicine at regular intervals. Do not take it more often than directed. Do not stop taking except on your doctor's advice. Talk to your pediatrician about the use of this medicine in children. Special care may be needed. Overdosage: If you think you have taken too much of this medicine contact a poison control center or emergency room at once. NOTE: This medicine is only for you. Do not share this medicine with others. What if I miss a dose? This does not apply. This medicine is not for regular use. What may interact with this medicine? Do not take this medicine with any of the following medicines:  ceritinib  certain antibiotics like chloramphenicol, clarithromycin, telithromycin  certain antivirals for HIV like  atazanavir, cobicistat, darunavir, delavirdine, fosamprenavir, indinavir, ritonavir  certain medicines for fungal infections like itraconazole, ketoconazole, posaconazole, voriconazole  conivaptan  grapefruit  idelalisib  mifepristone  nefazodone  ribociclib This medicine may also interact with the following medications:  carvedilol  certain medicines for seizures like phenobarbital, phenytoin  ciprofloxacin  cyclosporine  eltrombopag  fluconazole  fluvoxamine  quinidine  rifampin  St. John's wort  verapamil This list may not describe all possible interactions. Give your health care provider a list of all the medicines, herbs, non-prescription drugs, or dietary supplements you use. Also tell them if you smoke, drink alcohol, or use illegal drugs. Some items may interact with your medicine. What should I watch for while using this medicine? Visit your health care professional for regular checks on your progress. Tell your health care professional if your symptoms do not start to get better or if they get worse. Your mouth may get dry. Chewing sugarless gum or sucking hard candy and drinking plenty of water may help. Contact your health care professional if the problem does not go away or is severe. What side effects may I notice from receiving this medicine? Side effects that you should report to your doctor or health care professional as soon as possible:  allergic reactions like skin rash, itching or hives; swelling of the face, lips, or tongue Side effects that usually do not require medical attention (report these  to your doctor or health care professional if they continue or are bothersome):  drowsiness  dry mouth  nausea  tiredness This list may not describe all possible side effects. Call your doctor for medical advice about side effects. You may report side effects to FDA at 1-800-FDA-1088. Where should I keep my medicine? Keep out of the reach of  children. Store at room temperature between 15 and 30 degrees C (59 and 86 degrees F). Throw away any unused medicine after the expiration date. NOTE: This sheet is a summary. It may not cover all possible information. If you have questions about this medicine, talk to your doctor, pharmacist, or health care provider.  2020 Elsevier/Gold Standard (2018-06-09 08:50:55)

## 2019-07-28 ENCOUNTER — Other Ambulatory Visit: Payer: Self-pay | Admitting: Adult Health

## 2019-07-28 LAB — COMPLETE METABOLIC PANEL WITH GFR
AG Ratio: 1.9 (calc) (ref 1.0–2.5)
ALT: 23 U/L (ref 9–46)
AST: 27 U/L (ref 10–35)
Albumin: 4.4 g/dL (ref 3.6–5.1)
Alkaline phosphatase (APISO): 92 U/L (ref 35–144)
BUN/Creatinine Ratio: 31 (calc) — ABNORMAL HIGH (ref 6–22)
BUN: 33 mg/dL — ABNORMAL HIGH (ref 7–25)
CO2: 26 mmol/L (ref 20–32)
Calcium: 9.9 mg/dL (ref 8.6–10.3)
Chloride: 107 mmol/L (ref 98–110)
Creat: 1.05 mg/dL (ref 0.70–1.25)
GFR, Est African American: 87 mL/min/{1.73_m2} (ref >=60)
GFR, Est Non African American: 75 mL/min/{1.73_m2} (ref >=60)
Globulin: 2.3 g/dL (calc) (ref 1.9–3.7)
Glucose, Bld: 107 mg/dL — ABNORMAL HIGH (ref 65–99)
Potassium: 4.6 mmol/L (ref 3.5–5.3)
Sodium: 142 mmol/L (ref 135–146)
Total Bilirubin: 0.4 mg/dL (ref 0.2–1.2)
Total Protein: 6.7 g/dL (ref 6.1–8.1)

## 2019-07-28 LAB — CBC WITH DIFFERENTIAL/PLATELET
Absolute Monocytes: 542 cells/uL (ref 200–950)
Basophils Absolute: 72 cells/uL (ref 0–200)
Basophils Relative: 1.5 %
Eosinophils Absolute: 202 cells/uL (ref 15–500)
Eosinophils Relative: 4.2 %
HCT: 42.9 % (ref 38.5–50.0)
Hemoglobin: 13.8 g/dL (ref 13.2–17.1)
Lymphs Abs: 1152 cells/uL (ref 850–3900)
MCH: 27.1 pg (ref 27.0–33.0)
MCHC: 32.2 g/dL (ref 32.0–36.0)
MCV: 84.3 fL (ref 80.0–100.0)
MPV: 9.5 fL (ref 7.5–12.5)
Monocytes Relative: 11.3 %
Neutro Abs: 2832 cells/uL (ref 1500–7800)
Neutrophils Relative %: 59 %
Platelets: 308 10*3/uL (ref 140–400)
RBC: 5.09 10*6/uL (ref 4.20–5.80)
RDW: 13.8 % (ref 11.0–15.0)
Total Lymphocyte: 24 %
WBC: 4.8 10*3/uL (ref 3.8–10.8)

## 2019-07-28 LAB — LIPID PANEL
Cholesterol: 143 mg/dL (ref <200)
HDL: 47 mg/dL (ref >=40)
LDL Cholesterol (Calc): 76 mg/dL (calc)
Non-HDL Cholesterol (Calc): 96 mg/dL (calc) (ref <130)
Total CHOL/HDL Ratio: 3 (calc) (ref <5.0)
Triglycerides: 123 mg/dL (ref <150)

## 2019-07-28 LAB — MAGNESIUM: Magnesium: 2.6 mg/dL — ABNORMAL HIGH (ref 1.5–2.5)

## 2019-07-28 LAB — TSH: TSH: 0.83 mIU/L (ref 0.40–4.50)

## 2019-07-28 MED ORDER — MAGNESIUM 500 MG PO TABS: 1.0000 | ORAL_TABLET | Freq: Every day | ORAL | 0 refills | Status: AC

## 2019-08-11 ENCOUNTER — Other Ambulatory Visit: Payer: Self-pay | Admitting: Internal Medicine

## 2019-09-29 ENCOUNTER — Other Ambulatory Visit: Payer: Self-pay | Admitting: Physician Assistant

## 2019-10-03 ENCOUNTER — Ambulatory Visit
Admission: EM | Admit: 2019-10-03 | Discharge: 2019-10-03 | Disposition: A | Discharge status: Home / Self Care | Payer: BLUE CROSS/BLUE SHIELD | Attending: Emergency Medicine | Admitting: Emergency Medicine

## 2019-10-03 ENCOUNTER — Telehealth: Payer: Self-pay | Admitting: Emergency Medicine

## 2019-10-03 ENCOUNTER — Encounter: Payer: Self-pay | Admitting: Emergency Medicine

## 2019-10-03 ENCOUNTER — Other Ambulatory Visit: Payer: Self-pay

## 2019-10-03 DIAGNOSIS — J0140 Acute pansinusitis, unspecified: Secondary | ICD-10-CM

## 2019-10-03 MED ORDER — AMOXICILLIN-POT CLAVULANATE 875-125 MG PO TABS
1.0000 | ORAL_TABLET | Freq: Two times a day (BID) | ORAL | 0 refills | Status: DC
Start: 2019-10-03 — End: 2019-10-30

## 2019-10-03 MED ORDER — METHYLPREDNISOLONE 4 MG PO TBPK
ORAL_TABLET | Freq: Every day | ORAL | 0 refills | Status: DC
Start: 2019-10-03 — End: 2019-10-30

## 2019-10-03 MED ORDER — IBUPROFEN 600 MG PO TABS: 600.0000 mg | ORAL_TABLET | Freq: Four times a day (QID) | ORAL | 0 refills | Status: AC | PRN

## 2019-10-03 MED ORDER — FLUTICASONE PROPIONATE 50 MCG/ACT NA SUSP
2.0000 | Freq: Every day | NASAL | 0 refills | Status: DC
Start: 2019-10-03 — End: 2020-05-08

## 2019-10-03 NOTE — Discharge Instructions (Addendum)
Finish the Augmentin, even if you feel better.  Flonase as written.  This may take several days for it to really start working.  Start Mucinex to keep the mucous thin and to decongest you.  Return to the ER if you get worse, have a fever >100.4, or for any concerns. You may take 600 mg of motrin with 1 gram of tylenol up to 3-4 times a day as needed for pain. Use a NeilMed sinus rinse as often as you want to to reduce nasal congestion. Follow the directions on the box.   Go to www.goodrx.com to look up your medications. This will give you a list of where you can find your prescriptions at the most affordable prices. Or you can ask the pharmacist what the cash price is. This is frequently cheaper than going through insurance.

## 2019-10-03 NOTE — ED Provider Notes (Signed)
HPI  SUBJECTIVE:  Gerald [name] is a 64 y.o. male who presents with 2 weeks of nasal congestion, clear rhinorrhea, postnasal drip, upper dental pain, cough productive of yellowish-gray/greenish sputum.  He reports constant sinus pain and pressure, bilateral ear pain.  He reports increase of frequency of his migraine headaches.  States that he is getting worse.  No body aches, fevers, facial swelling, sore throat, loss of sense of smell or taste, chest pain, wheezing, shortness of breath, nausea, vomiting, diarrhea, abdominal pain.  No known exposure to Covid.  He got both doses of the Covid vaccine.  No antibiotics in the past month.  He took an antipyretic within 6 hours of evaluation.  He has tried cold and sinus medication, Mucinex, and his migraine medications.  The migraine medication seemed to help temporarily.  Symptoms are worse with lying down.  He states this started off as allergy type symptoms, but this has since resolved.  He has a past medical history of migraines, childhood asthma, hypertension.  No history of frequent sinusitis, allergies, diabetes.  PMD: Unk [name], MD   Past Medical History:  Diagnosis Date  . Hyperlipidemia   . Hypertension   . Pre-diabetes    no meds  . Vitamin D deficiency     Past Surgical History:  Procedure Laterality Date  . ESOPHAGOGASTRODUODENOSCOPY    . KNEE SURGERY     left  . ORIF ACETABULAR FRACTURE     left arm 2 times    Family History  Problem Relation Age of Onset  . Hypertension Mother   . Hyperlipidemia Mother   . Diabetes Mother   . Hypertension Father     Social History   Tobacco Use  . Smoking status: Never Smoker  . Smokeless tobacco: Never Used  Substance Use Topics  . Alcohol use: Yes    Alcohol/week: 2.0 - 5.0 standard drinks    Types: 2 - 5 Standard drinks or equivalent per week  . Drug use: No    No current facility-administered medications for this encounter.  Current Outpatient Medications:  .   ALPRAZolam (XANAX) 0.5 MG tablet, Take 1/2 to 1 tablet 2 to 3 x / day  ONLY if needed for Anxiety Attack & please try to limit to 5 days /week to avoid Addiction & Dementia, Disp: 90 tablet, Rfl: 0 .  Ascorbic Acid (VITAMIN C) 1000 MG tablet, Take 1,000 mg by mouth 2 (two) times daily., Disp: , Rfl:  .  butalbital-acetaminophen-caffeine (FIORICET) 50-325-40 MG tablet, TAKE 1 TABLET EVERY 4 TO 6 HOURS ONLY IF NEEDED FOR SEVERE HEADACHE, Disp: 30 tablet, Rfl: 0 .  Cholecalciferol (VITAMIN D-3) 5000 UNITS TABS, Take 5,000 Units by mouth daily. , Disp: , Rfl:  .  clobetasol cream (TEMOVATE) 0.05 %, APPLY TO RASH TWICE DAILY AS NEEDED, Disp: 60 g, Rfl: 3 .  cyclobenzaprine (FLEXERIL) 10 MG tablet, Take 10 mg by mouth as needed for muscle spasms., Disp: , Rfl:  .  Flaxseed, Linseed, (FLAX SEED OIL PO), Take 1,000 mg by mouth 3 (three) times daily. , Disp: , Rfl:  .  Glucosamine-Chondroitin (GLUCOSAMINE CHONDR COMPLEX PO), Take by mouth. Take 2 tablets daily, Disp: , Rfl:  .  Magnesium 500 MG TABS, Take 1 tablet (500 mg total) by mouth daily., Disp: 30 tablet, Rfl: 0 .  Melatonin 10 MG CAPS, Take by mouth Nightly., Disp: , Rfl:  .  moexipril (UNIVASC) 15 MG tablet, Take 1 tablet (15 mg total) by mouth daily. for  blood pressure, Disp: 90 tablet, Rfl: 1 .  Omega-3 Fatty Acids (FISH OIL PO), Take 1,000 mg by mouth 3 (three) times daily. , Disp: , Rfl:  .  pregabalin (LYRICA) 200 MG capsule, Take 200 mg by mouth 2 (two) times daily., Disp: , Rfl:  .  Probiotic Product (ALIGN) 4 MG CAPS, Take 1 capsule daily for Probiotic Benefit, Disp: 90 capsule, Rfl: 1 .  rosuvastatin (CRESTOR) 40 MG tablet, Take 1 tablet (40 mg total) by mouth daily., Disp: 90 tablet, Rfl: 1 .  amoxicillin-clavulanate (AUGMENTIN) 875-125 MG tablet, Take 1 tablet by mouth 2 (two) times daily. X 7 days, Disp: 14 tablet, Rfl: 0 .  fluticasone (FLONASE) 50 MCG/ACT nasal spray, Place 2 sprays into both nostrils daily., Disp: 16 g, Rfl: 0 .   ibuprofen (ADVIL) 600 MG tablet, Take 1 tablet (600 mg total) by mouth every 6 (six) hours as needed., Disp: 30 tablet, Rfl: 0 .  tadalafil (CIALIS) 5 MG tablet, TAKE ONE TABLET BY MOUTH DAILY FOR PROSTATISM, Disp: 90 tablet, Rfl: 0 .  TAZORAC 0.05 % cream, as needed. , Disp: , Rfl:  .  Testosterone 10 MG/ACT (2%) GEL, Apply 4 pumps Daily to Thighs, Disp: 180 g, Rfl: 1 .  Ubrogepant (UBRELVY) 100 MG TABS, Take 1 tab by mouth with onset of migraine; repeat in 2 hours if needed for max 200 mg/24 hours., Disp: 2 tablet, Rfl: 0 .  vitamin B-12 (CYANOCOBALAMIN) 100 MCG tablet, Take 100 mcg by mouth daily., Disp: , Rfl:  .  zolmitriptan (ZOMIG) 5 MG tablet, as needed. , Disp: , Rfl: 0  Not on File   ROS  As noted in HPI.   Physical Exam  BP (!) 131/91 (BP Location: Right Arm)   Pulse 60   Temp 98 F (36.7 C) (Oral)   Resp 18   Ht 5\' 9"  (1.753 m)   Wt 83.9 kg   SpO2 98%   BMI 27.32 kg/m   Constitutional: Well developed, well nourished, no acute distress Eyes:  EOMI, conjunctiva normal bilaterally HENT: Normocephalic, atraumatic,mucus membranes moist. TMs normal bilaterally. +  clear nasal congestion.  red turbinates. +  maxillary sinus tenderness, +  frontal sinus tenderness. Oropharynx normal + postnasal drip.  Respiratory: Normal inspiratory effort, lungs clear bilaterally Cardiovascular: Normal rate regular rhythm no murmurs rubs or gallops GI: nondistended skin: No rash, skin intact Musculoskeletal: no deformities Neurologic: Alert & oriented x 3, no focal neuro deficits Psychiatric: Speech and behavior appropriate   ED Course   Medications - No data to display  No orders of the defined types were placed in this encounter.   No results found for this or any previous visit (from the past 24 hour(s)). No results found.  ED Clinical Impression  Acute non-recurrent pansinusitis   ED Assessment/Plan  Patient with a sinusitis.  Covid low in the differential.  Suspect  the migraines are being triggered off by the sinusitis. Pt with indications for abx due to duration of symptoms and general worsening condition.. Will start augmentin,  in addition to nasal steriods, mucinex , saline nasal irrigation, increase fluids, tylenol/motrin prn pain. Discussed MDM and plan with pt. Discussed sn/sx that should prompt return to the UC or PMD. Pt agrees with plan and will f/u with PMD prn.  *This clinic note was created using Dragon dictation software. Therefore, there may be occasional mistakes despite careful proofreading.  ?   Melynda Ripple, MD 10/03/19 1115

## 2019-10-03 NOTE — ED Triage Notes (Addendum)
Patient c/o nasal congestion and facial pressure that started 2 weeks ago. He does report a cough mainly at night but some during the day. Patient reports no concerns for COVID. States he has had both vaccines.

## 2019-10-03 NOTE — Telephone Encounter (Signed)
Patient's wife called stating that patient has a headache, that steroids usually help.  I sent him home with Tylenol/ibuprofen for pain, not sure if he is taking this, however, will send in a Medrol dose pack to the pharmacy on record.  He is to continue Tylenol/ibuprofen, and if his headache gets worse despite taking the steroids, and then he needs to go to the emergency department.  Melynda Ripple, MD

## 2019-10-04 ENCOUNTER — Ambulatory Visit: Payer: BLUE CROSS/BLUE SHIELD | Admitting: Physician Assistant

## 2019-10-26 ENCOUNTER — Other Ambulatory Visit: Payer: Self-pay | Admitting: Internal Medicine

## 2019-10-29 ENCOUNTER — Encounter: Payer: Self-pay | Admitting: Internal Medicine

## 2019-10-29 NOTE — Patient Instructions (Signed)

## 2019-10-29 NOTE — Progress Notes (Signed)
History of Present Illness:       This very nice 64 y.o.  [name] presents for 6 month follow up with HTN, HLD, Pre-Diabetes and Vitamin D Deficiency.       Patient is treated for HTN (1999)  & BP has been controlled at home. Today's BP is at goal - 110/73. Patient had a negative Heart Cath in 2008 has had no complaints of any cardiac type chest pain, palpitations, dyspnea / orthopnea / PND, dizziness, claudication, or dependent edema.      Hyperlipidemia is controlled with diet & Rosuvastatin. Patient denies myalgias or other med SE's. Last Lipids were at goal:  Lab Results  Component Value Date   CHOL 143 07/27/2019   HDL 47 07/27/2019   LDLCALC 76 07/27/2019   TRIG 123 07/27/2019   CHOLHDL 3.0 07/27/2019    Also, the patient has history of  PreDiabetes (A1c 5.7% / 2011) and has had no symptoms of reactive hypoglycemia, diabetic polys, paresthesias or visual blurring.  Last A1c was Normal & at goal:  Lab Results  Component Value Date   HGBA1C 5.6 04/27/2019                                            Patient has hx/o Low T  since 2005 &has been on replacement with Testosterone gel ever since & reports improved stamina & sense of well being.         Further, the patient also has history of Vitamin D Deficiency ("48" on Tx in 2008)  and supplements vitamin D without any suspected side-effects. Last vitamin D was near goal (70-100):  Lab Results  Component Value Date   VD25OH 54 04/27/2019    Current Outpatient Medications on File Prior to Visit  Medication Sig  . ALPRAZolam (XANAX) 0.5 MG tablet Take 1/2 to 1 tablet 2 to 3 x / day  ONLY if needed for Anxiety Attack & please try to limit to 5 days /week to avoid Addiction & Dementia  . Ascorbic Acid (VITAMIN C) 1000 MG tablet Take 1,000 mg by mouth 2 (two) times daily.  . butalbital-acetaminophen-caffeine (FIORICET) 50-325-40 MG tablet TAKE 1 TABLET EVERY 4 TO 6 HOURS ONLY IF NEEDED FOR SEVERE HEADACHE  . Cholecalciferol  (VITAMIN D-3) 5000 UNITS TABS Take 5,000 Units by mouth daily.   . clobetasol cream (TEMOVATE) 0.05 % APPLY TO RASH TWICE DAILY AS NEEDED  . cyclobenzaprine (FLEXERIL) 10 MG tablet Take 10 mg by mouth as needed for muscle spasms.  . Flaxseed, Linseed, (FLAX SEED OIL PO) Take 1,000 mg by mouth 3 (three) times daily.   . fluticasone (FLONASE) 50 MCG/ACT nasal spray Place 2 sprays into both nostrils daily.  . Glucosamine-Chondroitin (GLUCOSAMINE CHONDR COMPLEX PO) Take by mouth. Take 2 tablets daily  . ibuprofen (ADVIL) 600 MG tablet Take 1 tablet (600 mg total) by mouth every 6 (six) hours as needed.  . Magnesium 500 MG TABS Take 1 tablet (500 mg total) by mouth daily.  . Melatonin 10 MG CAPS Take by mouth Nightly.  . moexipril (UNIVASC) 15 MG tablet Take 1 tablet (15 mg total) by mouth daily. for blood pressure  . Omega-3 Fatty Acids (FISH OIL PO) Take 1,000 mg by mouth 3 (three) times daily.   . pregabalin (LYRICA) 200 MG capsule Take 200 mg by mouth 2 (two)  times daily.  . Probiotic Product (ALIGN) 4 MG CAPS Take 1 capsule daily for Probiotic Benefit  . rosuvastatin (CRESTOR) 40 MG tablet TAKE 1 TABLET DAILY  . tadalafil (CIALIS) 5 MG tablet TAKE ONE TABLET BY MOUTH DAILY FOR PROSTATISM  . TAZORAC 0.05 % cream as needed.   . Testosterone 10 MG/ACT (2%) GEL Apply 4 pumps Daily to Thighs  . Ubrogepant (UBRELVY) 100 MG TABS Take 1 tab by mouth with onset of migraine; repeat in 2 hours if needed for max 200 mg/24 hours.  . vitamin B-12 (CYANOCOBALAMIN) 100 MCG tablet Take 100 mcg by mouth daily.  Marland Kitchen zolmitriptan (ZOMIG) 5 MG tablet as needed.    No current facility-administered medications on file prior to visit.    PMHx:   Past Medical History:  Diagnosis Date  . Hyperlipidemia   . Hypertension   . Pre-diabetes    no meds  . Vitamin D deficiency     Immunization History  Administered Date(s) Administered  . Influenza Inj Mdck Quad Pf 01/04/2019  . Influenza Inj Mdck Quad With  Preservative 12/27/2017  . Influenza Split 01/08/2014  . Influenza Whole 01/05/2013  . Influenza-Unspecified 01/05/2015  . PPD Test 01/08/2014, 01/14/2015, 12/12/2015, 01/12/2017, 02/08/2018, 04/27/2019  . Tdap 06/15/2008    Past Surgical History:  Procedure Laterality Date  . ESOPHAGOGASTRODUODENOSCOPY    . KNEE SURGERY     left  . ORIF ACETABULAR FRACTURE     left arm 2 times    FHx:    Reviewed / unchanged  SHx:    Reviewed / unchanged   Systems Review:  Constitutional: Denies fever, chills, wt changes, headaches, insomnia, fatigue, night sweats, change in appetite. Eyes: Denies redness, blurred vision, diplopia, discharge, itchy, watery eyes.  ENT: Denies discharge, congestion, post nasal drip, epistaxis, sore throat, earache, hearing loss, dental pain, tinnitus, vertigo, sinus pain, snoring.  CV: Denies chest pain, palpitations, irregular heartbeat, syncope, dyspnea, diaphoresis, orthopnea, PND, claudication or edema. Respiratory: denies cough, dyspnea, DOE, pleurisy, hoarseness, laryngitis, wheezing.  Gastrointestinal: Denies dysphagia, odynophagia, heartburn, reflux, water brash, abdominal pain or cramps, nausea, vomiting, bloating, diarrhea, constipation, hematemesis, melena, hematochezia  or hemorrhoids. Genitourinary: Denies dysuria, frequency, urgency, nocturia, hesitancy, discharge, hematuria or flank pain. Musculoskeletal: Denies arthralgias, myalgias, stiffness, jt. swelling, pain, limping or strain/sprain.  Skin: Denies pruritus, rash, hives, warts, acne, eczema or change in skin lesion(s). Neuro: No weakness, tremor, incoordination, spasms, paresthesia or pain. Psychiatric: Denies confusion, memory loss or sensory loss. Endo: Denies change in weight, skin or hair change.  Heme/Lymph: No excessive bleeding, bruising or enlarged lymph nodes.  Physical Exam  BP 110/73   Pulse 65   Temp (!) 97 F (36.1 C)   Resp 16   Ht 5\' 8"  (1.727 m)   Wt 188 lb 12.8 oz  (85.6 kg)   BMI 28.71 kg/m   Appears  well nourished, well groomed  and in no distress.  Eyes: PERRLA, EOMs, conjunctiva no swelling or erythema. Sinuses: No frontal/maxillary tenderness ENT/Mouth: EAC's clear, TM's nl w/o erythema, bulging. Nares clear w/o erythema, swelling, exudates. Oropharynx clear without erythema or exudates. Oral hygiene is good. Tongue normal, non obstructing. Hearing intact.  Neck: Supple. Thyroid not palpable. Car 2+/2+ without bruits, nodes or JVD. Chest: Respirations nl with BS clear & equal w/o rales, rhonchi, wheezing or stridor.  Cor: Heart sounds normal w/ regular rate and rhythm without sig. murmurs, gallops, clicks or rubs. Peripheral pulses normal and equal  without edema.  Abdomen: Soft & bowel  sounds normal. Non-tender w/o guarding, rebound, hernias, masses or organomegaly.  Lymphatics: Unremarkable.  Musculoskeletal: Full ROM all peripheral extremities, joint stability, 5/5 strength and normal gait.  Skin: Warm, dry without exposed rashes, lesions or ecchymosis apparent.  Neuro: Cranial nerves intact, reflexes equal bilaterally. Sensory-motor testing grossly intact. Tendon reflexes grossly intact.  Pysch: Alert & oriented x 3.  Insight and judgement nl & appropriate. No ideations.  Assessment and Plan:  1. Essential hypertension  - Continue medication, monitor blood pressure at home.  - Continue DASH diet.  Reminder to go to the ER if any CP,  SOB, nausea, dizziness, severe HA, changes vision/speech.  - CBC with Differential/Platelet - COMPLETE METABOLIC PANEL WITH GFR - Magnesium - TSH  2. Hyperlipidemia, mixed  - Continue diet/meds, exercise,& lifestyle modifications.  - Continue monitor periodic cholesterol/liver & renal functions   - Lipid panel - TSH  3. Abnormal glucose  - Continue diet, exercise  - Lifestyle modifications.  - Monitor appropriate labs.  - Hemoglobin A1c - Insulin, random  4. Vitamin D deficiency  -  Continue supplementation.  - VITAMIN D 25 Hydroxy  5. Testosterone Deficiency  - Testosterone  6. Medication management  - CBC with Differential/Platelet - COMPLETE METABOLIC PANEL WITH GFR - Magnesium - Lipid panel - TSH - Hemoglobin A1c - Insulin, random - VITAMIN D 25 Hydroxy - Testosterone        Discussed  regular exercise, BP monitoring, weight control to achieve/maintain BMI less than 25 and discussed med and SE's. Recommended labs to assess and monitor clinical status with further disposition pending results of labs.  I discussed the assessment and treatment plan with the patient. The patient was provided an opportunity to ask questions and all were answered. The patient agreed with the plan and demonstrated an understanding of the instructions.  I provided over 30 minutes of exam, counseling, chart review and  complex critical decision making.   Kirtland Bouchard, MD

## 2019-10-30 ENCOUNTER — Other Ambulatory Visit: Payer: Self-pay

## 2019-10-30 ENCOUNTER — Encounter: Payer: Self-pay | Admitting: Internal Medicine

## 2019-10-30 ENCOUNTER — Ambulatory Visit: Payer: BLUE CROSS/BLUE SHIELD | Admitting: Internal Medicine

## 2019-10-30 VITALS — BP 110/73 | HR 65 | Temp 97.0°F | Resp 16 | Ht 68.0 in | Wt 188.8 lb

## 2019-10-30 DIAGNOSIS — E782 Mixed hyperlipidemia: Secondary | ICD-10-CM | POA: Diagnosis not present

## 2019-10-30 DIAGNOSIS — I10 Essential (primary) hypertension: Secondary | ICD-10-CM | POA: Diagnosis not present

## 2019-10-30 DIAGNOSIS — E559 Vitamin D deficiency, unspecified: Secondary | ICD-10-CM | POA: Diagnosis not present

## 2019-10-30 DIAGNOSIS — R7309 Other abnormal glucose: Secondary | ICD-10-CM | POA: Diagnosis not present

## 2019-10-30 DIAGNOSIS — E291 Testicular hypofunction: Secondary | ICD-10-CM | POA: Diagnosis not present

## 2019-10-30 DIAGNOSIS — Z79899 Other long term (current) drug therapy: Secondary | ICD-10-CM

## 2019-10-31 LAB — COMPLETE METABOLIC PANEL WITH GFR
AG Ratio: 2 (calc) (ref 1.0–2.5)
ALT: 23 U/L (ref 9–46)
AST: 26 U/L (ref 10–35)
Albumin: 4.3 g/dL (ref 3.6–5.1)
Alkaline phosphatase (APISO): 92 U/L (ref 35–144)
BUN: 21 mg/dL (ref 7–25)
CO2: 27 mmol/L (ref 20–32)
Calcium: 9.9 mg/dL (ref 8.6–10.3)
Chloride: 106 mmol/L (ref 98–110)
Creat: 1.07 mg/dL (ref 0.70–1.25)
GFR, Est African American: 85 mL/min/{1.73_m2} (ref >=60)
GFR, Est Non African American: 73 mL/min/{1.73_m2} (ref >=60)
Globulin: 2.2 g/dL (calc) (ref 1.9–3.7)
Glucose, Bld: 112 mg/dL — ABNORMAL HIGH (ref 65–99)
Potassium: 4 mmol/L (ref 3.5–5.3)
Sodium: 140 mmol/L (ref 135–146)
Total Bilirubin: 0.3 mg/dL (ref 0.2–1.2)
Total Protein: 6.5 g/dL (ref 6.1–8.1)

## 2019-10-31 LAB — LIPID PANEL
Cholesterol: 150 mg/dL (ref <200)
HDL: 53 mg/dL (ref >=40)
LDL Cholesterol (Calc): 75 mg/dL (calc)
Non-HDL Cholesterol (Calc): 97 mg/dL (calc) (ref <130)
Total CHOL/HDL Ratio: 2.8 (calc) (ref <5.0)
Triglycerides: 138 mg/dL (ref <150)

## 2019-10-31 LAB — CBC WITH DIFFERENTIAL/PLATELET
Absolute Monocytes: 449 cells/uL (ref 200–950)
Basophils Absolute: 41 cells/uL (ref 0–200)
Basophils Relative: 0.8 %
Eosinophils Absolute: 199 cells/uL (ref 15–500)
Eosinophils Relative: 3.9 %
HCT: 42.3 % (ref 38.5–50.0)
Hemoglobin: 13.5 g/dL (ref 13.2–17.1)
Lymphs Abs: 1030 cells/uL (ref 850–3900)
MCH: 26.8 pg — ABNORMAL LOW (ref 27.0–33.0)
MCHC: 31.9 g/dL — ABNORMAL LOW (ref 32.0–36.0)
MCV: 83.9 fL (ref 80.0–100.0)
MPV: 10 fL (ref 7.5–12.5)
Monocytes Relative: 8.8 %
Neutro Abs: 3381 cells/uL (ref 1500–7800)
Neutrophils Relative %: 66.3 %
Platelets: 244 10*3/uL (ref 140–400)
RBC: 5.04 10*6/uL (ref 4.20–5.80)
RDW: 14 % (ref 11.0–15.0)
Total Lymphocyte: 20.2 %
WBC: 5.1 10*3/uL (ref 3.8–10.8)

## 2019-10-31 LAB — HEMOGLOBIN A1C
Hgb A1c MFr Bld: 5.5 % of total Hgb (ref <5.7)
Mean Plasma Glucose: 111 (calc)
eAG (mmol/L): 6.2 (calc)

## 2019-10-31 LAB — INSULIN, RANDOM: Insulin: 21 u[IU]/mL — ABNORMAL HIGH

## 2019-10-31 LAB — TESTOSTERONE: Testosterone: 1043 ng/dL — ABNORMAL HIGH (ref 250–827)

## 2019-10-31 LAB — TSH: TSH: 1.27 mIU/L (ref 0.40–4.50)

## 2019-10-31 LAB — VITAMIN D 25 HYDROXY (VIT D DEFICIENCY, FRACTURES): Vit D, 25-Hydroxy: 72 ng/mL (ref 30–100)

## 2019-10-31 LAB — MAGNESIUM: Magnesium: 2 mg/dL (ref 1.5–2.5)

## 2019-10-31 NOTE — Progress Notes (Signed)
==================================================== -   Total Chol = 150 and LDL Chol = 75 - Both  Excellent   - Very low risk for Heart Attack  / Stroke ====================================================  - A1c - Normal - Great - No Diabetes ====================================================  - Vitamin D = 72 - Excellent  ! ====================================================  - Testosterone elevated from supplement - suggest   decrease from 4 pumps to 3 pumps /day  =====================================================  - All Else - CBC - Kidneys - Electrolytes - Liver - Magnesium & Thyroid    - all  Normal / OK ====================================================   - Keep up the [place] Work ! ===================================================== ====================================================  - Total Chol = 150 and LDL Chol = 75 - Both  Excellent   - Very low risk for Heart Attack  / Stroke ====================================================  - A1c - Normal - Great - No Diabetes ====================================================  - Vitamin D = 72 - Excellent  ! ====================================================  - Testosterone elevated from supplement - suggest   decrease from 4 pumps to 3 pumps /day  ====================================================  - All Else - CBC - Kidneys - Electrolytes - Liver - Magnesium & Thyroid    - all  Normal / OK =====================================================   - Keep up the [place] Work ! =====================================================

## 2019-11-14 ENCOUNTER — Other Ambulatory Visit: Payer: Self-pay | Admitting: Adult Health

## 2019-11-14 DIAGNOSIS — G43719 Chronic migraine without aura, intractable, without status migrainosus: Secondary | ICD-10-CM | POA: Diagnosis not present

## 2019-11-14 DIAGNOSIS — G43019 Migraine without aura, intractable, without status migrainosus: Secondary | ICD-10-CM | POA: Diagnosis not present

## 2019-11-16 ENCOUNTER — Other Ambulatory Visit: Payer: Self-pay | Admitting: Physician Assistant

## 2019-12-18 ENCOUNTER — Other Ambulatory Visit: Payer: Self-pay | Admitting: Physician Assistant

## 2019-12-22 ENCOUNTER — Other Ambulatory Visit: Payer: Self-pay | Admitting: Internal Medicine

## 2019-12-27 ENCOUNTER — Ambulatory Visit: Payer: BLUE CROSS/BLUE SHIELD | Admitting: Dermatology

## 2019-12-27 ENCOUNTER — Other Ambulatory Visit: Payer: Self-pay

## 2019-12-27 ENCOUNTER — Encounter: Payer: Self-pay | Admitting: Dermatology

## 2019-12-27 DIAGNOSIS — L570 Actinic keratosis: Secondary | ICD-10-CM

## 2019-12-27 DIAGNOSIS — Q828 Other specified congenital malformations of skin: Secondary | ICD-10-CM | POA: Diagnosis not present

## 2019-12-27 DIAGNOSIS — L304 Erythema intertrigo: Secondary | ICD-10-CM | POA: Diagnosis not present

## 2019-12-27 DIAGNOSIS — L821 Other seborrheic keratosis: Secondary | ICD-10-CM

## 2019-12-27 DIAGNOSIS — D225 Melanocytic nevi of trunk: Secondary | ICD-10-CM

## 2019-12-27 DIAGNOSIS — Z1283 Encounter for screening for malignant neoplasm of skin: Secondary | ICD-10-CM

## 2019-12-27 DIAGNOSIS — M67472 Ganglion, left ankle and foot: Secondary | ICD-10-CM | POA: Diagnosis not present

## 2019-12-27 DIAGNOSIS — D229 Melanocytic nevi, unspecified: Secondary | ICD-10-CM

## 2019-12-27 NOTE — Progress Notes (Signed)
LN2 x 1 on right forearm

## 2019-12-27 NOTE — Progress Notes (Signed)
   Follow-Up Visit   Subjective  Gerald [name] is a 64 y.o. male who presents for the following: Annual Exam (SKIN CK + White bumps left groin tried clobetasol on them per PCP and they are much better).  General skin examination Location:  Duration:  Quality:  Associated Signs/Symptoms: Modifying Factors:  Severity:  Timing: Context:   Objective  Well appearing patient in no apparent distress; mood and affect are within normal limits.  A full examination was performed including scalp, head, eyes, ears, nose, lips, neck, chest, axillae, abdomen, back, buttocks, bilateral upper extremities, bilateral lower extremities, hands, feet, fingers, toes, fingernails, and toenails. All findings within normal limits unless otherwise noted below.   Assessment & Plan    Digital mucous cyst of toe of left foot Left 3rd Distal Interphalangeal Joint of Toe  Gerald [name] is ready quite knowledgeable about this recurrent cyst and is comfortable for now leaving it be.  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  Discouraged freezing or removal.  Seborrheic keratosis (2) Left Upper Arm - Posterior; Right Upper Arm - Posterior  Leave if stable  Nevus Mid Back  Annual skin examination  Erythema intertrigo Left Medial Thigh  Return if recur  Skin cancer screening performed today.  Multiple issues  discussed today with Gerald [name].  His initial primary concern was noticing 3 bumps above the left inner leg crease which he stated had notable bad odor.  He thinks these might have peeled off recently, and on examination neither he nor I could find such lesions.  We did a general skin examination and there were no atypical moles, melanoma, or nonmobile skin cancer.  A precancer on the right proximal forearm was treated with liquid nitrogen freeze.  Multiple benign keratoses particularly on the tricep areas as well as acral keratoses on the feet require no intervention.  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  Routine reexamination 1 year.  I, Gerald Monarch, MD, have reviewed all documentation for this visit.  The documentation on 12/27/19 for the exam, diagnosis, procedures, and orders are all accurate and complete.

## 2019-12-29 ENCOUNTER — Other Ambulatory Visit: Payer: Self-pay | Admitting: Internal Medicine

## 2019-12-29 DIAGNOSIS — E349 Endocrine disorder, unspecified: Secondary | ICD-10-CM

## 2020-01-22 ENCOUNTER — Other Ambulatory Visit: Payer: Self-pay | Admitting: Adult Health

## 2020-01-22 ENCOUNTER — Other Ambulatory Visit: Payer: Self-pay | Admitting: Internal Medicine

## 2020-01-30 DIAGNOSIS — H5213 Myopia, bilateral: Secondary | ICD-10-CM | POA: Diagnosis not present

## 2020-01-30 DIAGNOSIS — H31002 Unspecified chorioretinal scars, left eye: Secondary | ICD-10-CM | POA: Diagnosis not present

## 2020-01-31 ENCOUNTER — Ambulatory Visit: Payer: BLUE CROSS/BLUE SHIELD | Admitting: Adult Health Nurse Practitioner

## 2020-01-31 ENCOUNTER — Encounter: Payer: Self-pay | Admitting: Adult Health Nurse Practitioner

## 2020-01-31 ENCOUNTER — Other Ambulatory Visit: Payer: Self-pay

## 2020-01-31 VITALS — BP 118/76 | HR 70 | Temp 97.3°F | Wt 190.0 lb

## 2020-01-31 DIAGNOSIS — K5900 Constipation, unspecified: Secondary | ICD-10-CM

## 2020-01-31 DIAGNOSIS — E782 Mixed hyperlipidemia: Secondary | ICD-10-CM | POA: Diagnosis not present

## 2020-01-31 DIAGNOSIS — E291 Testicular hypofunction: Secondary | ICD-10-CM | POA: Diagnosis not present

## 2020-01-31 DIAGNOSIS — E559 Vitamin D deficiency, unspecified: Secondary | ICD-10-CM | POA: Diagnosis not present

## 2020-01-31 DIAGNOSIS — R7309 Other abnormal glucose: Secondary | ICD-10-CM | POA: Diagnosis not present

## 2020-01-31 DIAGNOSIS — M25551 Pain in right hip: Secondary | ICD-10-CM

## 2020-01-31 DIAGNOSIS — Z79899 Other long term (current) drug therapy: Secondary | ICD-10-CM

## 2020-01-31 DIAGNOSIS — I10 Essential (primary) hypertension: Secondary | ICD-10-CM | POA: Diagnosis not present

## 2020-01-31 DIAGNOSIS — G43009 Migraine without aura, not intractable, without status migrainosus: Secondary | ICD-10-CM

## 2020-01-31 DIAGNOSIS — N400 Enlarged prostate without lower urinary tract symptoms: Secondary | ICD-10-CM

## 2020-01-31 DIAGNOSIS — E663 Overweight: Secondary | ICD-10-CM

## 2020-01-31 NOTE — Progress Notes (Signed)
FOLLOW UP 3 MONTH  Assessment and Plan:   Hypertension Well controlled with current medications  Monitor blood pressure at home; patient to call if consistently greater than 130/80 Continue DASH diet.   Reminder to go to the ER if any CP, SOB, nausea, dizziness, severe HA, changes vision/speech, left arm numbness and tingling and jaw pain.  Cholesterol Currently at goal; continue statin Continue low cholesterol diet and exercise.  Check lipid panel.   Hx of Prediabetes,/ abnormal glucose Recent A1Cs at goal Discussed diet/exercise, weight management  Defer A1C; check CMP  Overweight BMI 28 Long discussion about weight loss, diet, and exercise Recommended diet heavy in fruits and veggies and low in animal meats, cheeses, and dairy products, appropriate calorie intake Patient will work on increasing exercise; Will follow up in 3 months  Vitamin D Def Near goal at last visit; continue supplementation to maintain goal of 60-100 Defer Vit D level   Testosterone deficency - continue to monitor, states medication is helping with symptoms of low T.  Androgel 3 pumps a day.  Constipation Takes probiotic daily Also take metamucil  Migraine without aura and without status migrainosus, not intractable -    Ubrelvy 100 mg tabs; take 1 tab with onset of migraine; repeat in 2 hours if needed for max dose of 200 mg/24 hours. Sample.   Medication Management Continued Right Hip Pain Hip flexor? Provided stretching exercises Discussed meloxicam 15mg  once a day, take with dinner Monitor blood pressure Consider mild muscle relaxer?   Further disposition pending results if labs check today. Discussed med's effects and SE's.   Over 30 minutes of face to face interview, exam, counseling, chart review, and critical decision making was performed.    Future Appointments  Date Time Provider [place]  05/08/2020 11:00 AM Unk [name], MD [facility] None  12/31/2020 11:30 AM  Lavonna Monarch, MD [facility]    ----------------------------------------------------------------------------------------------------------------------  HPI 64 y.o. male  presents for 3 month follow up on hypertension, HTN, HLD, glucose management, weight and vitamin D deficiency.   He has history of migraines; doing fairly, followed by Dr. Orie Rout at headache clinic; reports migraines in clusters/spurts, variable. Will have several then none for 3-4 months. Takes zomig up to twice per week and supplements which fioricet.   He is prescribed xanax, takes 0.25 mg PRN at night for sleep, typically takes only 3-4 times per month, increased with stress. Has some stress r/t 83 year old mother. He goes to the [facility] 2-3 times a week, also works out in the yard frequently.  He reports he used to run but he has not been doing this recently.  Reports he things he hurt it 1.5 year ago.  Right hip has been sore.  Does not bother with day to activity, main with sleeping.    BMI is Body mass index is 28.89 kg/m., he has been working on diet and exercise, he runs as weather allows. He monitors weights closely at home. Follows good diet 80+% of the time.  Wt Readings from Last 3 Encounters:  01/31/20 190 lb (86.2 kg)  10/30/19 188 lb 12.8 oz (85.6 kg)  10/03/19 185 lb (83.9 kg)   HTN predates 1999.  Negative heart cath in 2008. His blood pressure has been controlled at home, today their BP is BP: 118/76  He does workout. He denies chest pain, shortness of breath, dizziness.   He is on cholesterol medication (rosuvastatin 20 mg daily) and denies myalgias. His cholesterol is at  goal. The cholesterol last visit was:   Lab Results  Component Value Date   CHOL 150 10/30/2019   HDL 53 10/30/2019   LDLCALC 75 10/30/2019   TRIG 138 10/30/2019   CHOLHDL 2.8 10/30/2019    He has been working on diet and exercise for hx of prediabetes (A1c 5.7% / 2011), and denies increased appetite, nausea,  paresthesia of the feet, polydipsia, polyuria, visual disturbances and vomiting. Last A1C in the office was:  Lab Results  Component Value Date   HGBA1C 5.5 10/30/2019   Patient is on Vitamin D supplement for deficiency (48, 2008) and at goal last check:   Lab Results  Component Value Date   VD25OH 72 10/30/2019     He has a history of testosterone deficiency (2005) and is on testosterone replacement (gel). He states that the testosterone helps with his energy, libido, muscle mass. Lab Results  Component Value Date   TESTOSTERONE 1,043 (H) 10/30/2019   He reports mild intermittent LUTs, slow stream, nocturia typically 1-2, rarely up to 4.  Lab Results  Component Value Date   PSA 3.2 04/27/2019   PSA 3.8 02/08/2018   PSA 3.5 01/12/2017     Current Outpatient Medications on File Prior to Visit  Medication Sig Dispense Refill  . ALPRAZolam (XANAX) 0.5 MG tablet Take 1/2 to 1 tablet 2 to 3 x / day  ONLY if needed for Anxiety Attack & please try to limit to 5 days /week to avoid Addiction & Dementia 90 tablet 0  . Ascorbic Acid (VITAMIN C) 1000 MG tablet Take 1,000 mg by mouth 2 (two) times daily.    . butalbital-acetaminophen-caffeine (FIORICET) 50-325-40 MG tablet TAKE 1 TABLET EVERY 4 TO 6 HOURS ONLY IF NEEDED FOR SEVERE HEADACHE 30 tablet 0  . Cholecalciferol (VITAMIN D-3) 5000 UNITS TABS Take 5,000 Units by mouth daily.     . clobetasol cream (TEMOVATE) 0.05 % APPLY TO RASH TWICE DAILY AS NEEDED 60 g 3  . cyclobenzaprine (FLEXERIL) 10 MG tablet Take 10 mg by mouth as needed for muscle spasms.    . Flaxseed, Linseed, (FLAX SEED OIL PO) Take 1,000 mg by mouth 3 (three) times daily.     . fluticasone (FLONASE) 50 MCG/ACT nasal spray Place 2 sprays into both nostrils daily. 16 g 0  . Glucosamine-Chondroitin (GLUCOSAMINE CHONDR COMPLEX PO) Take by mouth. Take 2 tablets daily    . ibuprofen (ADVIL) 600 MG tablet Take 1 tablet (600 mg total) by mouth every 6 (six) hours as needed. 30  tablet 0  . Magnesium 500 MG TABS Take 1 tablet (500 mg total) by mouth daily. 30 tablet 0  . Melatonin 10 MG CAPS Take by mouth Nightly.    . moexipril (UNIVASC) 15 MG tablet TAKE 1 TABLET (15 MG TOTAL) BY MOUTH DAILY. FOR BLOOD PRESSURE 90 tablet 1  . Omega-3 Fatty Acids (FISH OIL PO) Take 1,000 mg by mouth 3 (three) times daily.     . pregabalin (LYRICA) 200 MG capsule Take 200 mg by mouth 2 (two) times daily.    . Probiotic Product (ALIGN) 4 MG CAPS Take 1 capsule daily for Probiotic Benefit 90 capsule 1  . rosuvastatin (CRESTOR) 40 MG tablet TAKE 1 TABLET DAILY 90 tablet 1  . tadalafil (CIALIS) 5 MG tablet TAKE 1 TABLET DAILY FOR    PROSTATISM 90 tablet 3  . TAZORAC 0.05 % cream as needed.     . Testosterone 10 MG/ACT (2%) GEL Apply  4 pumps    Daily     to the thighs 180 g 1  . Ubrogepant (UBRELVY) 100 MG TABS Take 1 tab by mouth with onset of migraine; repeat in 2 hours if needed for max 200 mg/24 hours. 2 tablet 0  . vitamin B-12 (CYANOCOBALAMIN) 100 MCG tablet Take 100 mcg by mouth daily.    Marland Kitchen zolmitriptan (ZOMIG) 5 MG tablet as needed.   0   No current facility-administered medications on file prior to visit.     Allergies: No Known Allergies   Medical History:  Past Medical History:  Diagnosis Date  . Hyperlipidemia   . Hypertension   . Pre-diabetes    no meds  . Vitamin D deficiency    Family history- Reviewed and unchanged Social history- Reviewed and unchanged   Review of Systems:  Review of Systems  Constitutional: Negative for chills, fever, malaise/fatigue and weight loss.  HENT: Negative for congestion, hearing loss, sinus pain, sore throat and tinnitus.   Eyes: Negative for blurred vision and double vision.  Respiratory: Negative for cough, sputum production, shortness of breath and wheezing.   Cardiovascular: Negative for chest pain, palpitations, orthopnea, claudication and leg swelling.  Gastrointestinal: Negative for abdominal pain, blood in stool,  constipation, diarrhea, heartburn, melena, nausea and vomiting.  Genitourinary: Negative.  Negative for dysuria and urgency.  Musculoskeletal: Positive for myalgias. Negative for back pain, falls and joint pain.       Hip Pain, right  Skin: Negative for rash.  Neurological: Negative for dizziness, tingling, sensory change, weakness and headaches.  Endo/Heme/Allergies: Negative for environmental allergies and polydipsia.  Psychiatric/Behavioral: Negative.  Negative for depression and suicidal ideas.  All other systems reviewed and are negative.    Physical Exam: BP 118/76   Pulse 70   Temp (!) 97.3 F (36.3 C)   Wt 190 lb (86.2 kg)   SpO2 99%   BMI 28.89 kg/m  Wt Readings from Last 3 Encounters:  01/31/20 190 lb (86.2 kg)  10/30/19 188 lb 12.8 oz (85.6 kg)  10/03/19 185 lb (83.9 kg)   General Appearance: Well nourished, in no apparent distress. Eyes: PERRLA, EOMs, conjunctiva no swelling or erythema Sinuses: No Frontal/maxillary tenderness ENT/Mouth: Ext aud canals clear, TMs without erythema, bulging. No erythema, swelling, or exudate on post pharynx.  Tonsils not swollen or erythematous. Hearing normal.  Neck: Supple, thyroid normal.  Respiratory: Respiratory effort normal, BS equal bilaterally without rales, rhonchi, wheezing or stridor.  Cardio: RRR with no MRGs. Brisk peripheral pulses without edema.  Abdomen: Soft, + BS.  Non tender, no guarding, rebound, hernias, masses. Lymphatics: Non tender without lymphadenopathy.  Musculoskeletal: Full ROM, 5/5 strength, Normal gait. Skin: Warm, dry without rashes, lesions, ecchymosis.  Neuro: Cranial nerves intact. No cerebellar symptoms.  Psych: Awake and oriented X 3, normal affect, Insight and Judgment appropriate.      Garnet Sierras, Laqueta Jean, DNP [facility] 01/31/2020  11:47 AM

## 2020-01-31 NOTE — Patient Instructions (Signed)
      Hip Flexor Strengthening    The hip flexor muscle group is comprised of the psoas major & minor, iliacus, and rectus femoris. These originate at your spine and connect your hips to the joint, down your thigh (rectus femoris) connecting to femor bone in your thigh and ending in the knee joint.  People weak or tight hip flexors can experience back or hip pain as well as pain down their thigh.  These muscles let you to walk, kick, bend, and swivel your hips  Below are stretches / exercises to strengthen/stretch these muscles. *The information is from [facility] in [place], [place].   Supine Hip Flexor Activation  This exercise can be done using either a squat rack or doorway. 1. Lie down on your back and position yourself with one foot resting on the rack or doorway and the other leg straight. 2. Your thigh should be positioned so your hip is just past a 90 degree angle towards your chest. 3. Begin to contract your deep hip flexors by lightly driving your knee towards your chest. This will lift your foot away from the squat rack or doorway. 4. At the same time, your other leg should be counteracting the motion by straightening and pressing down hard into the ground. Keep the back straight.  5. Hold for 3 seconds and then release to starting position.  6. Perform 5-10 reps for 1-3 sets per side or until fatigue.   Psoas March  Lay on your back with your knees bent in at a right angle and a neutral spine. 1. Wrap a tension band around your feet, keeping tension throughout the exercise. 2. Extend one leg and slowly bring the leg back, then repeat on the other side.  3. Complete 1-3 sets of 8 to 12 reps.    Straight leg raise   Sit on the floor with on leg extended and back straight. 1. Hug the other knee to your chest. 2. Engage your core and turn the other leg slightly outwards. 3. Begin to slowly lift your leg off the ground. 4. Hold for one second and then  slowly lower leg to the ground. 5. Perform 2-4 sets per side until failure. These exercises are deceptively challenging, especially if you have weak/tight hip flexors. Start off slow and if you feel a pinch or sharp feeling in the front of the hip, stop immediately and see a rehab practitioner to assess your hips.    Below are stretches for low back pain but also for Core Strengthening.  Everything is connected    

## 2020-02-01 LAB — CBC WITH DIFFERENTIAL/PLATELET
Absolute Monocytes: 451 cells/uL (ref 200–950)
Basophils Absolute: 41 cells/uL (ref 0–200)
Basophils Relative: 1 %
Eosinophils Absolute: 168 cells/uL (ref 15–500)
Eosinophils Relative: 4.1 %
HCT: 44.6 % (ref 38.5–50.0)
Hemoglobin: 14 g/dL (ref 13.2–17.1)
Lymphs Abs: 869 cells/uL (ref 850–3900)
MCH: 26.3 pg — ABNORMAL LOW (ref 27.0–33.0)
MCHC: 31.4 g/dL — ABNORMAL LOW (ref 32.0–36.0)
MCV: 83.7 fL (ref 80.0–100.0)
MPV: 10.1 fL (ref 7.5–12.5)
Monocytes Relative: 11 %
Neutro Abs: 2571 cells/uL (ref 1500–7800)
Neutrophils Relative %: 62.7 %
Platelets: 260 10*3/uL (ref 140–400)
RBC: 5.33 10*6/uL (ref 4.20–5.80)
RDW: 14.4 % (ref 11.0–15.0)
Total Lymphocyte: 21.2 %
WBC: 4.1 10*3/uL (ref 3.8–10.8)

## 2020-02-01 LAB — COMPLETE METABOLIC PANEL WITH GFR
AG Ratio: 1.9 (calc) (ref 1.0–2.5)
ALT: 20 U/L (ref 9–46)
AST: 24 U/L (ref 10–35)
Albumin: 4.4 g/dL (ref 3.6–5.1)
Alkaline phosphatase (APISO): 82 U/L (ref 35–144)
BUN: 25 mg/dL (ref 7–25)
CO2: 28 mmol/L (ref 20–32)
Calcium: 10.1 mg/dL (ref 8.6–10.3)
Chloride: 105 mmol/L (ref 98–110)
Creat: 1.12 mg/dL (ref 0.70–1.25)
GFR, Est African American: 81 mL/min/{1.73_m2} (ref >=60)
GFR, Est Non African American: 70 mL/min/{1.73_m2} (ref >=60)
Globulin: 2.3 g/dL (calc) (ref 1.9–3.7)
Glucose, Bld: 87 mg/dL (ref 65–99)
Potassium: 4.6 mmol/L (ref 3.5–5.3)
Sodium: 140 mmol/L (ref 135–146)
Total Bilirubin: 0.4 mg/dL (ref 0.2–1.2)
Total Protein: 6.7 g/dL (ref 6.1–8.1)

## 2020-02-01 LAB — LIPID PANEL
Cholesterol: 139 mg/dL (ref <200)
HDL: 49 mg/dL (ref >=40)
LDL Cholesterol (Calc): 69 mg/dL (calc)
Non-HDL Cholesterol (Calc): 90 mg/dL (calc) (ref <130)
Total CHOL/HDL Ratio: 2.8 (calc) (ref <5.0)
Triglycerides: 131 mg/dL (ref <150)

## 2020-02-01 LAB — TESTOSTERONE: Testosterone: 422 ng/dL (ref 250–827)

## 2020-02-26 ENCOUNTER — Other Ambulatory Visit: Payer: Self-pay | Admitting: Internal Medicine

## 2020-03-04 ENCOUNTER — Other Ambulatory Visit: Payer: Self-pay | Admitting: Internal Medicine

## 2020-04-11 ENCOUNTER — Other Ambulatory Visit: Payer: Self-pay | Admitting: Internal Medicine

## 2020-04-11 MED ORDER — TADALAFIL 5 MG PO TABS
ORAL_TABLET | ORAL | 3 refills | Status: DC
Start: 2020-04-11 — End: 2020-10-10

## 2020-04-19 ENCOUNTER — Other Ambulatory Visit: Payer: Self-pay | Admitting: Adult Health

## 2020-05-08 ENCOUNTER — Other Ambulatory Visit: Payer: Self-pay

## 2020-05-08 ENCOUNTER — Ambulatory Visit: Payer: BLUE CROSS/BLUE SHIELD | Admitting: Internal Medicine

## 2020-05-08 ENCOUNTER — Encounter: Payer: Self-pay | Admitting: Internal Medicine

## 2020-05-08 VITALS — BP 128/80 | HR 53 | Temp 97.0°F | Resp 16 | Ht 68.0 in | Wt 195.4 lb

## 2020-05-08 DIAGNOSIS — Z8249 Family history of ischemic heart disease and other diseases of the circulatory system: Secondary | ICD-10-CM

## 2020-05-08 DIAGNOSIS — Z1329 Encounter for screening for other suspected endocrine disorder: Secondary | ICD-10-CM | POA: Diagnosis not present

## 2020-05-08 DIAGNOSIS — Z1389 Encounter for screening for other disorder: Secondary | ICD-10-CM | POA: Diagnosis not present

## 2020-05-08 DIAGNOSIS — Z111 Encounter for screening for respiratory tuberculosis: Secondary | ICD-10-CM

## 2020-05-08 DIAGNOSIS — Z130 Encounter for screening for diseases of the blood and blood-forming organs and certain disorders involving the immune mechanism: Secondary | ICD-10-CM

## 2020-05-08 DIAGNOSIS — Z13220 Encounter for screening for lipoid disorders: Secondary | ICD-10-CM

## 2020-05-08 DIAGNOSIS — Z1212 Encounter for screening for malignant neoplasm of rectum: Secondary | ICD-10-CM

## 2020-05-08 DIAGNOSIS — Z0001 Encounter for general adult medical examination with abnormal findings: Secondary | ICD-10-CM

## 2020-05-08 DIAGNOSIS — N138 Other obstructive and reflux uropathy: Secondary | ICD-10-CM

## 2020-05-08 DIAGNOSIS — R5383 Other fatigue: Secondary | ICD-10-CM

## 2020-05-08 DIAGNOSIS — Z136 Encounter for screening for cardiovascular disorders: Secondary | ICD-10-CM | POA: Diagnosis not present

## 2020-05-08 DIAGNOSIS — I10 Essential (primary) hypertension: Secondary | ICD-10-CM | POA: Diagnosis not present

## 2020-05-08 DIAGNOSIS — E559 Vitamin D deficiency, unspecified: Secondary | ICD-10-CM | POA: Diagnosis not present

## 2020-05-08 DIAGNOSIS — E782 Mixed hyperlipidemia: Secondary | ICD-10-CM

## 2020-05-08 DIAGNOSIS — N401 Enlarged prostate with lower urinary tract symptoms: Secondary | ICD-10-CM

## 2020-05-08 DIAGNOSIS — G43009 Migraine without aura, not intractable, without status migrainosus: Secondary | ICD-10-CM

## 2020-05-08 DIAGNOSIS — Z125 Encounter for screening for malignant neoplasm of prostate: Secondary | ICD-10-CM

## 2020-05-08 DIAGNOSIS — R7309 Other abnormal glucose: Secondary | ICD-10-CM

## 2020-05-08 DIAGNOSIS — Z79899 Other long term (current) drug therapy: Secondary | ICD-10-CM

## 2020-05-08 DIAGNOSIS — Z0000 Encounter for general adult medical examination without abnormal findings: Secondary | ICD-10-CM

## 2020-05-08 DIAGNOSIS — Z1211 Encounter for screening for malignant neoplasm of colon: Secondary | ICD-10-CM

## 2020-05-08 DIAGNOSIS — E291 Testicular hypofunction: Secondary | ICD-10-CM

## 2020-05-08 NOTE — Progress Notes (Signed)
Annual  Screening/Preventative Visit  & Comprehensive Evaluation & Examination      This very nice 65 y.o. [name] presents for a Screening /Preventative Visit & comprehensive evaluation and management of multiple medical co-morbidities.  Patient has been followed for HTN, HLD, Prediabetes and Vitamin D Deficiency.     HTN predates circa 1999. Patient's BP has been controlled and today's BP is at goal - 128/80.  In 2008, patient had a false Myoview vindicated by a Negative Heart Cath. Patient denies any cardiac symptoms as chest pain, palpitations, shortness of breath, dizziness or ankle swelling.      Patient's hyperlipidemia is controlled with diet and medications. Patient denies myalgias or other medication SE's. Last lipids were at goal:  Lab Results  Component Value Date   CHOL 139 01/31/2020   HDL 49 01/31/2020   LDLCALC 69 01/31/2020   TRIG 131 01/31/2020   CHOLHDL 2.8 01/31/2020       Patient has hx/o prediabetes (A1c 5.7%/2011)and patient denies reactive hypoglycemic symptoms, visual blurring, diabetic polys or paresthesias. Last A1c was normal & at goal:  Lab Results  Component Value Date   HGBA1C 5.5 10/30/2019                                            Patient is on gel  Testosterone replacement for Testosterone Deficiency since 2005 &reports improved stamina & sense of well being.       Finally, patient has history of Vitamin D Deficiency ("48"on Tx/2008) and last vitamin D was at goal:   Lab Results  Component Value Date   VD25OH 72 10/30/2019    Current Outpatient Medications on File Prior to Visit  Medication Sig  . ALPRAZolam 0.5 MG tablet Take 1/2 - 1 tablet 2 - 3 x /day ONLYif needed   . VITAMIN C 1000 MG tablet Take  2  times daily.  Marland Kitchen FIORICET Take 1 tablet every 4 to 6 hours  ONLY if needed   . VITAMIN D 5000 UNITS  Take  daily.   . clobetasol cream  0.05 % APPLY TO RASH TWICE DAILY A  . cyclobenzaprine 10 MG tablet Take 3 x/day as needed for muscle  spasms.  Marland Kitchen FLAX SEED OIL  1,000 mg Take   3 times daily.   . Glucosamine-Chondroitin   Take 2 tablets daily  . ibuprofen  600 MG tablet Take 1 tablet  every 6 (six) hours as needed.  . Magnesium 500 MG TABS Take 1 tablet  daily.  . Melatonin 10 MG CAPS TakeNightly.  . moexipril  15 MG tablet TAKE 1 TABLET DAILY  . Omega-3 FISH OI L  1,000 mg  Take  3  times daily.   . pregabalin  200 MG capsule Take  2 times daily.  . Probiotic Product  4 MG CAPS Take 1 capsule daily for Probiotic Benefit  . rosuvastatin  40 MG tablet TAKE 1 TABLET DAILY  . tadalafil  5 MG tablet TAKE 1 TABLET DAILY   . TAZORAC 0.05 % cream as needed.   . Testosterone 10 MG (2%) GEL Apply    4 pumps    Daily     to the thighs  . vitamin B-12 100 MCG tablet Take 100 mcg  daily.  Marland Kitchen zolmitriptan  5 MG tablet as needed.     No Known Allergies  Past  Medical History:  Diagnosis Date  . Hyperlipidemia   . Hypertension   . Pre-diabetes    no meds  . Vitamin D deficiency    Health Maintenance  Topic Date Due  . Hepatitis C Screening  Never done  . HIV Screening  Never done  . TETANUS/TDAP  06/16/2018  . INFLUENZA VACCINE  11/05/2019  . COVID-19 Vaccine (2 - Pfizer risk 4-dose series) 03/30/2020  . COLONOSCOPY (Pts 45-4yrs Insurance coverage will need to be confirmed)  08/01/2024   Immunization History  Administered Date(s) Administered  . Influenza Inj Mdck Quad Pf 01/04/2019  . Influenza Inj Mdck Quad With Preservative 12/27/2017  . Influenza Split 01/08/2014  . Influenza Whole 01/05/2013  . Influenza-Unspecified 01/05/2015  . PFIZER(Purple Top)SARS-COV-2 Vaccination 03/09/2020  . PPD Test 01/08/2014, 01/14/2015, 12/12/2015, 01/12/2017, 02/08/2018, 04/27/2019  . Tdap 06/15/2008   Last Colon - 08/02/2014 - Dr Fuller Plan - Recc 10 yr f/u due May 2026  Past Surgical History:  Procedure Laterality Date  . ESOPHAGOGASTRODUODENOSCOPY    . KNEE SURGERY     left  . ORIF ACETABULAR FRACTURE     left arm 2 times    Family History  Problem Relation Age of Onset  . Hypertension Mother   . Hyperlipidemia Mother   . Diabetes Mother   . Hypertension Father    Social History   Socioeconomic History  . Marital status: Married    Spouse name: Sharee Pimple  . Number of children: 1 daughter & 2 step children  Occupational History  . Systems analyst at [employer]  Tobacco Use  . Smoking status: Never Smoker  . Smokeless tobacco: Never Used  Vaping Use  . Vaping Use: Never used  Substance and Sexual Activity  . Alcohol use: Yes    Alcohol/week: 2.0 - 5.0 standard drinks    Types: 2 - 5 Standard drinks or equivalent per week  . Drug use: No  . Sexual activity: Not on file    ROS Constitutional: Denies fever, chills, weight loss/gain, headaches, insomnia,  night sweats or change in appetite. Does c/o fatigue. Eyes: Denies redness, blurred vision, diplopia, discharge, itchy or watery eyes.  ENT: Denies discharge, congestion, post nasal drip, epistaxis, sore throat, earache, hearing loss, dental pain, Tinnitus, Vertigo, Sinus pain or snoring.  Cardio: Denies chest pain, palpitations, irregular heartbeat, syncope, dyspnea, diaphoresis, orthopnea, PND, claudication or edema Respiratory: denies cough, dyspnea, DOE, pleurisy, hoarseness, laryngitis or wheezing.  Gastrointestinal: Denies dysphagia, heartburn, reflux, water brash, pain, cramps, nausea, vomiting, bloating, diarrhea, constipation, hematemesis, melena, hematochezia, jaundice or hemorrhoids Genitourinary: Denies dysuria, frequency, urgency, nocturia, hesitancy, discharge, hematuria or flank pain Musculoskeletal: Denies arthralgia, myalgia, stiffness, Jt. Swelling, pain, limp or strain/sprain. Denies Falls. Skin: Denies puritis, rash, hives, warts, acne, eczema or change in skin lesion Neuro: No weakness, tremor, incoordination, spasms, paresthesia or pain Psychiatric: Denies confusion, memory loss or sensory loss. Denies Depression. Endocrine: Denies  change in weight, skin, hair change, nocturia, and paresthesia, diabetic polys, visual blurring or hyper / hypo glycemic episodes.  Heme/Lymph: No excessive bleeding, bruising or enlarged lymph nodes.  Physical Exam  BP 128/80   Pulse (!) 53   Temp (!) 97 F (36.1 C)   Resp 16   Ht 5\' 8"  (1.727 m)   Wt 195 lb 6.4 oz (88.6 kg)   SpO2 97%   BMI 29.71 kg/m   General Appearance: Well nourished and well groomed and in no apparent distress.  Eyes: PERRLA, EOMs, conjunctiva no swelling or erythema, normal fundi and  vessels. Sinuses: No frontal/maxillary tenderness ENT/Mouth: EACs patent / TMs  nl. Nares clear without erythema, swelling, mucoid exudates. Oral hygiene is good. No erythema, swelling, or exudate. Tongue normal, non-obstructing. Tonsils not swollen or erythematous. Hearing normal.  Neck: Supple, thyroid not palpable. No bruits, nodes or JVD. Respiratory: Respiratory effort normal.  BS equal and clear bilateral without rales, rhonci, wheezing or stridor. Cardio: Heart sounds are normal with regular rate and rhythm and no murmurs, rubs or gallops. Peripheral pulses are normal and equal bilaterally without edema. No aortic or femoral bruits. Chest: symmetric with normal excursions and percussion.  Abdomen: Soft, with Nl bowel sounds. Nontender, no guarding, rebound, hernias, masses, or organomegaly.  Lymphatics: Non tender without lymphadenopathy.  Musculoskeletal: Full ROM all peripheral extremities, joint stability, 5/5 strength, and normal gait. Skin: Warm and dry without rashes, lesions, cyanosis, clubbing or  ecchymosis.  Neuro: Cranial nerves intact, reflexes equal bilaterally. Normal muscle tone, no cerebellar symptoms. Sensation intact.  Pysch: Alert and oriented X 3 with normal affect, insight and judgment appropriate.   Assessment and Plan  1. Annual Preventative/Screening Exam    2. Essential hypertension  - EKG 12-Lead - [place], RETROPERITNL ABD,  LTD - Urinalysis,  Routine w reflex microscopic - Microalbumin / creatinine urine ratio - COMPLETE METABOLIC PANEL WITH GFR - Magnesium - TSH  3. Hyperlipidemia, mixed  - EKG 12-Lead - [place], RETROPERITNL ABD,  LTD - Lipid panel - TSH  4. Abnormal glucose  - EKG 12-Lead - [place], RETROPERITNL ABD,  LTD - Hemoglobin A1c - Insulin, random  5. Vitamin D deficiency  - VITAMIN D 25 Hydroxy (Vit-D Deficiency, Fractures)  6. Testosterone Deficiency  - Testosterone  7. BPH with obstruction/lower urinary tract symptoms  - PSA  8. Migraine without aura and without status migrainosus, not intractable   9. Screening for colorectal cancer  - POC Hemoccult Bld/Stl (3-Cd Home Screen); Future  10. Prostate cancer screening  - PSA  11. Screening examination for pulmonary tuberculosis  - TB Skin Test  12. Screening for ischemic heart disease  - EKG 12-Lead  13. FH: hypertension  - EKG 12-Lead - [place], RETROPERITNL ABD,  LTD  14. Screening for AAA (aortic abdominal aneurysm)  - [place], RETROPERITNL ABD,  LTD  15. Fatigue, unspecified type  - Iron,Total/Total Iron Binding Cap - Vitamin B12 - Testosterone - CBC with Differential/Platelet - TSH  16. Medication management  - Urinalysis, Routine w reflex microscopic - Microalbumin / creatinine urine ratio          Patient was counseled in prudent diet, weight control to achieve/maintain BMI less than 25, BP monitoring, regular exercise and medications as discussed.  Discussed med effects and SE's. Routine screening labs and tests as requested with regular follow-up as recommended. Over 40 minutes of exam, counseling, chart review and high complex critical decision making was performed   Kirtland Bouchard, MD \

## 2020-05-08 NOTE — Patient Instructions (Signed)

## 2020-05-09 ENCOUNTER — Telehealth: Payer: Self-pay | Admitting: Internal Medicine

## 2020-05-09 LAB — URINALYSIS, ROUTINE W REFLEX MICROSCOPIC
Bacteria, UA: NONE SEEN /HPF
Bilirubin Urine: NEGATIVE
Glucose, UA: NEGATIVE
Hgb urine dipstick: NEGATIVE
Hyaline Cast: NONE SEEN /LPF
Ketones, ur: NEGATIVE
Leukocytes,Ua: NEGATIVE
Nitrite: NEGATIVE
RBC / HPF: NONE SEEN /HPF (ref 0–2)
Specific Gravity, Urine: 1.015 (ref 1.001–1.03)
Squamous Epithelial / HPF: NONE SEEN /HPF (ref <=5)
WBC, UA: NONE SEEN /HPF (ref 0–5)
pH: 6.5 (ref 5.0–8.0)

## 2020-05-09 LAB — IRON, TOTAL/TOTAL IRON BINDING CAP
%SAT: 13 % (calc) — ABNORMAL LOW (ref 20–48)
Iron: 64 ug/dL (ref 50–180)
TIBC: 480 mcg/dL (calc) — ABNORMAL HIGH (ref 250–425)

## 2020-05-09 LAB — COMPLETE METABOLIC PANEL WITH GFR
AG Ratio: 2.2 (calc) (ref 1.0–2.5)
ALT: 25 U/L (ref 9–46)
AST: 28 U/L (ref 10–35)
Albumin: 4.8 g/dL (ref 3.6–5.1)
Alkaline phosphatase (APISO): 101 U/L (ref 35–144)
BUN: 17 mg/dL (ref 7–25)
CO2: 31 mmol/L (ref 20–32)
Calcium: 10.4 mg/dL — ABNORMAL HIGH (ref 8.6–10.3)
Chloride: 103 mmol/L (ref 98–110)
Creat: 1.08 mg/dL (ref 0.70–1.25)
GFR, Est African American: 84 mL/min/{1.73_m2} (ref >=60)
GFR, Est Non African American: 73 mL/min/{1.73_m2} (ref >=60)
Globulin: 2.2 g/dL (calc) (ref 1.9–3.7)
Glucose, Bld: 80 mg/dL (ref 65–99)
Potassium: 5.2 mmol/L (ref 3.5–5.3)
Sodium: 141 mmol/L (ref 135–146)
Total Bilirubin: 0.4 mg/dL (ref 0.2–1.2)
Total Protein: 7 g/dL (ref 6.1–8.1)

## 2020-05-09 LAB — HEMOGLOBIN A1C
Hgb A1c MFr Bld: 5.7 % of total Hgb — ABNORMAL HIGH (ref <5.7)
Mean Plasma Glucose: 117 mg/dL
eAG (mmol/L): 6.5 mmol/L

## 2020-05-09 LAB — LIPID PANEL
Cholesterol: 148 mg/dL (ref <200)
HDL: 58 mg/dL (ref >=40)
LDL Cholesterol (Calc): 71 mg/dL (calc)
Non-HDL Cholesterol (Calc): 90 mg/dL (calc) (ref <130)
Total CHOL/HDL Ratio: 2.6 (calc) (ref <5.0)
Triglycerides: 102 mg/dL (ref <150)

## 2020-05-09 LAB — CBC WITH DIFFERENTIAL/PLATELET
Absolute Monocytes: 496 cells/uL (ref 200–950)
Basophils Absolute: 59 cells/uL (ref 0–200)
Basophils Relative: 1.4 %
Eosinophils Absolute: 189 cells/uL (ref 15–500)
Eosinophils Relative: 4.5 %
HCT: 44.6 % (ref 38.5–50.0)
Hemoglobin: 14.3 g/dL (ref 13.2–17.1)
Lymphs Abs: 1134 cells/uL (ref 850–3900)
MCH: 26.8 pg — ABNORMAL LOW (ref 27.0–33.0)
MCHC: 32.1 g/dL (ref 32.0–36.0)
MCV: 83.7 fL (ref 80.0–100.0)
MPV: 9.7 fL (ref 7.5–12.5)
Monocytes Relative: 11.8 %
Neutro Abs: 2323 cells/uL (ref 1500–7800)
Neutrophils Relative %: 55.3 %
Platelets: 325 10*3/uL (ref 140–400)
RBC: 5.33 10*6/uL (ref 4.20–5.80)
RDW: 13.5 % (ref 11.0–15.0)
Total Lymphocyte: 27 %
WBC: 4.2 10*3/uL (ref 3.8–10.8)

## 2020-05-09 LAB — VITAMIN B12: Vitamin B-12: 1226 pg/mL — ABNORMAL HIGH (ref 200–1100)

## 2020-05-09 LAB — TESTOSTERONE: Testosterone: 680 ng/dL (ref 250–827)

## 2020-05-09 LAB — MICROALBUMIN / CREATININE URINE RATIO
Creatinine, Urine: 110 mg/dL (ref 20–320)
Microalb Creat Ratio: 74 mcg/mg creat — ABNORMAL HIGH (ref <30)
Microalb, Ur: 8.1 mg/dL

## 2020-05-09 LAB — INSULIN, RANDOM: Insulin: 15.7 u[IU]/mL

## 2020-05-09 LAB — MAGNESIUM: Magnesium: 2.4 mg/dL (ref 1.5–2.5)

## 2020-05-09 LAB — VITAMIN D 25 HYDROXY (VIT D DEFICIENCY, FRACTURES): Vit D, 25-Hydroxy: 62 ng/mL (ref 30–100)

## 2020-05-09 LAB — PSA: PSA: 3.94 ng/mL (ref <=4.0)

## 2020-05-09 LAB — TSH: TSH: 1.25 mIU/L (ref 0.40–4.50)

## 2020-05-09 NOTE — Telephone Encounter (Signed)
Pt had pfizer vaccine and pfizer booster pt is driving and will bring vaccination card to md office to scan in his chart

## 2020-05-09 NOTE — Progress Notes (Signed)
========================================================== -   Test results slightly outside the reference range are not unusual. If there is anything important, I will review this with you,  otherwise it is considered normal test values.  If you have further questions,  please do not hesitate to contact me at the office or via My Chart.  ========================================================== ==========================================================  -  Iron level is low  -  so recc take an OTC iron pill from the drug store  - Also  Recommend eat more Veggies with Iron as Carrots, Beets , all leafy green veggies as Spinach, Collards,  Turnip - Mustard or Mixed Greens, Kale, Asparagus, Broccoli, Brussel Sprouts, Green Beans / peas, Soybeans, Lentils, Sweet Potatoes   ========================================================== ==========================================================  -  PSA - still low - Great  ========================================================== ==========================================================  -  Total Chol = 148      and        LDL Chol = 71        - Both  Excellent   - Very low risk for Heart Attack  / Stroke ========================================================  - A1c = 5.7%  Blood sugar and A1c are elevated in the borderline and  early or pre-diabetes range which has the same   300% increased risk for heart attack, stroke, cancer and   alzheimer- type vascular dementia as full blown diabetes.   But the good news is that diet, exercise with  weight loss can cure the early diabetes at this point. ========================================================== ==========================================================  -  Vitamin D = 62 - Excellent  ========================================================== ==========================================================  -  All Else - CBC - Kidneys - Electrolytes - Liver - Magnesium & Thyroid     - all  Normal / OK ===========================================================  ===========================================================

## 2020-05-13 LAB — TB SKIN TEST
Induration: 0 mm
TB Skin Test: NEGATIVE

## 2020-05-14 DIAGNOSIS — G43719 Chronic migraine without aura, intractable, without status migrainosus: Secondary | ICD-10-CM | POA: Diagnosis not present

## 2020-05-14 DIAGNOSIS — G43019 Migraine without aura, intractable, without status migrainosus: Secondary | ICD-10-CM | POA: Diagnosis not present

## 2020-05-21 ENCOUNTER — Other Ambulatory Visit: Payer: Self-pay

## 2020-05-21 DIAGNOSIS — Z1211 Encounter for screening for malignant neoplasm of colon: Secondary | ICD-10-CM

## 2020-05-21 DIAGNOSIS — Z1212 Encounter for screening for malignant neoplasm of rectum: Secondary | ICD-10-CM | POA: Diagnosis not present

## 2020-05-21 LAB — POC HEMOCCULT BLD/STL (HOME/3-CARD/SCREEN)
Card #2 Fecal Occult Blod, POC: NEGATIVE
Card #3 Fecal Occult Blood, POC: NEGATIVE
Fecal Occult Blood, POC: NEGATIVE

## 2020-06-17 ENCOUNTER — Other Ambulatory Visit: Payer: Self-pay | Admitting: Internal Medicine

## 2020-07-28 ENCOUNTER — Other Ambulatory Visit: Payer: Self-pay | Admitting: Internal Medicine

## 2020-07-28 DIAGNOSIS — E349 Endocrine disorder, unspecified: Secondary | ICD-10-CM

## 2020-08-07 DIAGNOSIS — G4700 Insomnia, unspecified: Secondary | ICD-10-CM | POA: Insufficient documentation

## 2020-08-07 NOTE — Progress Notes (Signed)
FOLLOW UP 3 MONTH  Assessment and Plan:   Hypertension Atypically elevated today; has been well controlled when checked sporadiacally Reduce stress, salt, animal protein; keep daily log rotating times at home and return for recheck NV in 2-4 weeks Monitor blood pressure at home; patient to call if consistently greater than 130/80 Continue DASH diet.   Reminder to go to the ER if any CP, SOB, nausea, dizziness, severe HA, changes vision/speech, left arm numbness and tingling and jaw pain.  Cholesterol Currently at goal; continue statin Continue low cholesterol diet and exercise.  Check lipid panel.   Hx of Prediabetes/ abnormal glucose Discussed diet/exercise, weight management  Check A1C q62m; check CMP  Overweight BMI 29 Long discussion about weight loss, diet, and exercise Recommended diet heavy in fruits and veggies and low in animal meats, cheeses, and dairy products, appropriate calorie intake Patient will work on increasing exercise; Will follow up in 3 months  Vitamin D Def At goal at last visit; continue supplementation to maintain goal of 60-100 Defer Vit D level   Testosterone deficency - continue to monitor, states medication is helping with symptoms of low T.  Androgel 3 pumps a day.  Medication Management CBC, CMP/GFR, magnesium     Further disposition pending results if labs check today. Discussed med's effects and SE's.   Over 30 minutes of face to face interview, exam, counseling, chart review, and critical decision making was performed.    Future Appointments  Date Time Provider [place]  12/31/2020 11:30 AM Lavonna Monarch, MD [facility]  05/15/2021  2:00 PM Unk [name], MD [facility] None    ----------------------------------------------------------------------------------------------------------------------  HPI 65 y.o. male  presents for 3 month follow up on hypertension, HTN, HLD, glucose management, weight and vitamin D  deficiency.   He has history of migraines; doing well, was followed by Dr. Orie Rout at headache clinic but no longer in network; reports migraines in clusters/spurts, variable. Will have several then none for 3-4 months. Takes lyria 200 mg BID as prophylactic (reduced from 25/month to current 5-8). Will take ibuprofen for mild which does work 75% of the time. Takes zomig up to twice per week and supplements which fioricet. Also has onzetra for severe that works very well but expensive with insurance. Nurtec didn't help. Has been on current regimen for 4-5 years, no changes and works fairly well, requests we take over prescribing meds for now.   He is prescribed xanax, takes 0.25 mg PRN at night for sleep, typically takes only 3-4 times per month, increased with stress. Has some stress r/t 72 year old mother, does have in home help.   BMI is Body mass index is 29.5 kg/m., he has been working on diet and exercise, He goes to the [facility]-3 times a [facility], also works out in the yard frequently. He monitors weights closely at home. Follows good diet 80+% of the time.  Wt Readings from Last 3 Encounters:  08/08/20 194 lb (88 kg)  05/08/20 195 lb 6.4 oz (88.6 kg)  01/31/20 190 lb (86.2 kg)   HTN predates 1999.  Negative heart cath in 2008. His blood pressure has been controlled at home (110-120s/70-80s), today their BP is BP: (!) 138/98, recheck by provider was 160/98, reports stressful day today, has been eating ribs daily with wife out of town  He does workout. He denies chest pain, shortness of breath, dizziness.   He is on cholesterol medication (rosuvastatin 20 mg daily) and denies myalgias. His cholesterol is  at goal. The cholesterol last visit was:   Lab Results  Component Value Date   CHOL 148 05/08/2020   HDL 58 05/08/2020   LDLCALC 71 05/08/2020   TRIG 102 05/08/2020   CHOLHDL 2.6 05/08/2020    He has been working on diet and exercise for hx of intermittent prediabetes (A1c 5.7% /  2011), and denies increased appetite, nausea, paresthesia of the feet, polydipsia, polyuria, visual disturbances and vomiting. Last A1C in the office was:  Lab Results  Component Value Date   HGBA1C 5.7 (H) 05/08/2020    Patient is on Vitamin D supplement for deficiency and at goal last check:   Lab Results  Component Value Date   VD25OH 17 05/08/2020     He has a history of testosterone deficiency (2005) and is on testosterone replacement (gel). He states that the testosterone helps with his energy, libido, muscle mass. Lab Results  Component Value Date   TESTOSTERONE 680 05/08/2020    Current Outpatient Medications on File Prior to Visit  Medication Sig Dispense Refill  . SUMAtriptan Succinate (ONZETRA XSAIL NA) Place 1 application into the nose See admin instructions. Once as needed with onset of migraine, up to twice per episode, 2 hours apart.    . ALPRAZolam (XANAX) 0.5 MG tablet Take      1/2 - 1 tablet      2 - 3 x /day      ONLY       if needed for Anxiety Attack &  limit to 5 days /week to avoid Addiction & Dementia 90 tablet 0  . Ascorbic Acid (VITAMIN C) 1000 MG tablet Take 1,000 mg by mouth 2 (two) times daily.    . butalbital-acetaminophen-caffeine (FIORICET) 50-325-40 MG tablet Take    1 tablet    every 4 to 6 hours     ONLY     if needed for Severe Headache 30 tablet 0  . Cholecalciferol (VITAMIN D-3) 5000 UNITS TABS Take 5,000 Units by mouth daily.     . clobetasol cream (TEMOVATE) 0.05 % APPLY TO RASH TWICE DAILY AS NEEDED 60 g 3  . cyclobenzaprine (FLEXERIL) 10 MG tablet Take 10 mg by mouth as needed for muscle spasms.    . Flaxseed, Linseed, (FLAX SEED OIL PO) Take 1,000 mg by mouth 3 (three) times daily.     . Glucosamine-Chondroitin (GLUCOSAMINE CHONDR COMPLEX PO) Take by mouth. Take 2 tablets daily    . ibuprofen (ADVIL) 600 MG tablet Take 1 tablet (600 mg total) by mouth every 6 (six) hours as needed. 30 tablet 0  . Magnesium 500 MG TABS Take 1 tablet (500 mg  total) by mouth daily. 30 tablet 0  . Melatonin 10 MG CAPS Take by mouth Nightly.    . moexipril (UNIVASC) 15 MG tablet TAKE 1 TABLET (15 MG TOTAL) BY MOUTH DAILY. FOR BLOOD PRESSURE 90 tablet 1  . Omega-3 Fatty Acids (FISH OIL PO) Take 1,000 mg by mouth 3 (three) times daily.     . pregabalin (LYRICA) 200 MG capsule Take 200 mg by mouth 2 (two) times daily.    . Probiotic Product (ALIGN) 4 MG CAPS Take 1 capsule daily for Probiotic Benefit 90 capsule 1  . rosuvastatin (CRESTOR) 40 MG tablet TAKE 1 TABLET DAILY 90 tablet 1  . tadalafil (CIALIS) 5 MG tablet TAKE 1 TABLET DAILY FOR    PROSTATISM 90 tablet 3  . TAZORAC 0.05 % cream as needed.     . Testosterone  10 MG/ACT (2%) GEL APPLY THE CONTENTS OF 4    PUMP ACTUATIONS DAILY TO   THIGHS 180 g 1  . vitamin B-12 (CYANOCOBALAMIN) 100 MCG tablet Take 100 mcg by mouth daily.    Marland Kitchen zolmitriptan (ZOMIG) 5 MG tablet as needed.   0   No current facility-administered medications on file prior to visit.     Allergies: No Known Allergies   Medical History:  Past Medical History:  Diagnosis Date  . Hyperlipidemia   . Hypertension   . Pre-diabetes    no meds  . Vitamin D deficiency    Family history- Reviewed and unchanged Social history- Reviewed and unchanged   Review of Systems:  Review of Systems  Constitutional: Negative for chills, fever, malaise/fatigue and weight loss.  HENT: Negative for congestion, hearing loss, sinus pain, sore throat and tinnitus.   Eyes: Negative for blurred vision and double vision.  Respiratory: Negative for cough, sputum production, shortness of breath and wheezing.   Cardiovascular: Negative for chest pain, palpitations, orthopnea, claudication and leg swelling.  Gastrointestinal: Negative for abdominal pain, blood in stool, constipation, diarrhea, heartburn, melena, nausea and vomiting.  Genitourinary: Negative.  Negative for dysuria and urgency.  Musculoskeletal: Negative for back pain, falls, joint pain  and myalgias.  Skin: Negative for rash.  Neurological: Positive for headaches (improved, stable). Negative for dizziness, tingling, sensory change and weakness.  Endo/Heme/Allergies: Negative for environmental allergies and polydipsia.  Psychiatric/Behavioral: Negative for depression and suicidal ideas. The patient is not nervous/anxious and does not have insomnia (occasional when stressed).   All other systems reviewed and are negative.    Physical Exam: BP (!) 138/98   Pulse 65   Temp (!) 97.5 F (36.4 C)   Wt 194 lb (88 kg)   SpO2 98%   BMI 29.50 kg/m  Wt Readings from Last 3 Encounters:  08/08/20 194 lb (88 kg)  05/08/20 195 lb 6.4 oz (88.6 kg)  01/31/20 190 lb (86.2 kg)   General Appearance: Well nourished, in no apparent distress. Eyes: PERRLA, EOMs, conjunctiva no swelling or erythema Sinuses: No Frontal/maxillary tenderness ENT/Mouth: Ext aud canals clear, TMs without erythema, bulging. No erythema, swelling, or exudate on post pharynx.  Tonsils not swollen or erythematous. Hearing normal.  Neck: Supple, thyroid normal.  Respiratory: Respiratory effort normal, BS equal bilaterally without rales, rhonchi, wheezing or stridor.  Cardio: RRR with no MRGs. Brisk peripheral pulses without edema.  Abdomen: Soft, + BS.  Non tender, no guarding, rebound, hernias, masses. Lymphatics: Non tender without lymphadenopathy.  Musculoskeletal: Full ROM, 5/5 strength, Normal gait.  Skin: Warm, dry without rashes, lesions, ecchymosis.  Neuro: Cranial nerves intact. No cerebellar symptoms.  Psych: Awake and oriented X 3, normal affect, Insight and Judgment appropriate.    Gerald Ribas, Gerald [name] 12:00 PM [facility]

## 2020-08-08 ENCOUNTER — Ambulatory Visit (INDEPENDENT_AMBULATORY_CARE_PROVIDER_SITE_OTHER): Payer: 59 | Admitting: Adult Health

## 2020-08-08 ENCOUNTER — Other Ambulatory Visit: Payer: Self-pay

## 2020-08-08 ENCOUNTER — Encounter: Payer: Self-pay | Admitting: Adult Health

## 2020-08-08 VITALS — BP 138/98 | HR 65 | Temp 97.5°F | Wt 194.0 lb

## 2020-08-08 DIAGNOSIS — D649 Anemia, unspecified: Secondary | ICD-10-CM

## 2020-08-08 DIAGNOSIS — R7303 Prediabetes: Secondary | ICD-10-CM

## 2020-08-08 DIAGNOSIS — E663 Overweight: Secondary | ICD-10-CM

## 2020-08-08 DIAGNOSIS — E782 Mixed hyperlipidemia: Secondary | ICD-10-CM | POA: Diagnosis not present

## 2020-08-08 DIAGNOSIS — G4700 Insomnia, unspecified: Secondary | ICD-10-CM

## 2020-08-08 DIAGNOSIS — E291 Testicular hypofunction: Secondary | ICD-10-CM | POA: Diagnosis not present

## 2020-08-08 DIAGNOSIS — I10 Essential (primary) hypertension: Secondary | ICD-10-CM

## 2020-08-08 DIAGNOSIS — Z79899 Other long term (current) drug therapy: Secondary | ICD-10-CM

## 2020-08-08 DIAGNOSIS — E559 Vitamin D deficiency, unspecified: Secondary | ICD-10-CM

## 2020-08-08 NOTE — Patient Instructions (Addendum)
Goals    . Blood Pressure < 130/80    . DIET - EAT MORE FRUITS AND VEGETABLES     5+ 1/2 cup servings daily     . Exercise 150 min/wk Moderate Activity    . HEMOGLOBIN A1C < 5.7    . LDL CALC < 100      Try to reduce animal fats/protein and salt, and increase potassium (fresh fruits/veggies)   Check blood pressure daily for a while, different times of the day, keep a log to bring next visit, goal is <130/80    HYPERTENSION INFORMATION  Monitor your blood pressure at home, please keep a record and bring that in with you to your next office visit.   Go to the ER if any CP, SOB, nausea, dizziness, severe HA, changes vision/speech  Testing/Procedures: HOW TO TAKE YOUR BLOOD PRESSURE:  Rest 5 minutes before taking your blood pressure.  Don't smoke or drink caffeinated beverages for at least 30 minutes before.  Take your blood pressure before (not after) you eat.  Sit comfortably with your back supported and both feet on the floor (don't cross your legs).  Elevate your arm to heart level on a table or a desk.  Use the proper sized cuff. It should fit smoothly and snugly around your bare upper arm. There should be enough room to slip a fingertip under the cuff. The bottom edge of the cuff should be 1 inch above the crease of the elbow.   Your most recent BP: BP: (!) 138/98   Take your medications faithfully as instructed. Maintain a healthy weight. Get at least 150 minutes of aerobic exercise per week. Minimize salt intake. Minimize alcohol intake  DASH Eating Plan DASH stands for "Dietary Approaches to Stop Hypertension." The DASH eating plan is a healthy eating plan that has been shown to reduce high blood pressure (hypertension). Additional health benefits may include reducing the risk of type 2 diabetes mellitus, heart disease, and stroke. The DASH eating plan may also help with weight loss. WHAT DO I NEED TO KNOW ABOUT THE DASH EATING PLAN? For the DASH eating plan,  you will follow these general guidelines:  Choose foods with a percent daily value for sodium of less than 5% (as listed on the food label).  Use salt-free seasonings or herbs instead of table salt or sea salt.  Check with your health care provider or pharmacist before using salt substitutes.  Eat lower-sodium products, often labeled as "lower sodium" or "no salt added."  Eat fresh foods.  Eat more vegetables, fruits, and low-fat dairy products.  Choose whole grains. Look for the word "whole" as the first word in the ingredient list.  Choose fish and skinless chicken or [place] more often than red meat. Limit fish, poultry, and meat to 6 oz (170 g) each day.  Limit sweets, desserts, sugars, and sugary drinks.  Choose heart-healthy fats.  Limit cheese to 1 oz (28 g) per day.  Eat more home-cooked food and less restaurant, buffet, and fast food.  Limit fried foods.  Cook foods using methods other than frying.  Limit canned vegetables. If you do use them, rinse them well to decrease the sodium.  When eating at a restaurant, ask that your food be prepared with less salt, or no salt if possible. WHAT FOODS CAN I EAT? Seek help from a dietitian for individual calorie needs. Grains Whole grain or whole wheat bread. Brown rice. Whole grain or whole wheat pasta. Quinoa, bulgur, and  whole grain cereals. Low-sodium cereals. Corn or whole wheat flour tortillas. Whole grain cornbread. Whole grain crackers. Low-sodium crackers. Vegetables Fresh or frozen vegetables (raw, steamed, roasted, or grilled). Low-sodium or reduced-sodium tomato and vegetable juices. Low-sodium or reduced-sodium tomato sauce and paste. Low-sodium or reduced-sodium canned vegetables.  Fruits All fresh, canned (in natural juice), or frozen fruits. Meat and Other Protein Products Ground beef (85% or leaner), grass-fed beef, or beef trimmed of fat. Skinless chicken or [place]. Ground chicken or [place]. Pork trimmed of  fat. All fish and seafood. Eggs. Dried beans, peas, or lentils. Unsalted nuts and seeds. Unsalted canned beans. Dairy Low-fat dairy products, such as skim or 1% milk, 2% or reduced-fat cheeses, low-fat ricotta or cottage cheese, or plain low-fat yogurt. Low-sodium or reduced-sodium cheeses. Fats and Oils Tub margarines without trans fats. Light or reduced-fat mayonnaise and salad dressings (reduced sodium). Avocado. Safflower, olive, or canola oils. Natural peanut or almond butter. Other Unsalted popcorn and pretzels. The items listed above may not be a complete list of recommended foods or beverages. Contact your dietitian for more options. WHAT FOODS ARE NOT RECOMMENDED? Grains White bread. White pasta. White rice. Refined cornbread. Bagels and croissants. Crackers that contain trans fat. Vegetables Creamed or fried vegetables. Vegetables in a cheese sauce. Regular canned vegetables. Regular canned tomato sauce and paste. Regular tomato and vegetable juices. Fruits Dried fruits. Canned fruit in light or heavy syrup. Fruit juice. Meat and Other Protein Products Fatty cuts of meat. Ribs, chicken wings, bacon, sausage, bologna, salami, chitterlings, fatback, hot dogs, bratwurst, and packaged luncheon meats. Salted nuts and seeds. Canned beans with salt. Dairy Whole or 2% milk, cream, half-and-half, and cream cheese. Whole-fat or sweetened yogurt. Full-fat cheeses or blue cheese. Nondairy creamers and whipped toppings. Processed cheese, cheese spreads, or cheese curds. Condiments Onion and garlic salt, seasoned salt, table salt, and sea salt. Canned and packaged gravies. Worcestershire sauce. Tartar sauce. Barbecue sauce. Teriyaki sauce. Soy sauce, including reduced sodium. Steak sauce. Fish sauce. Oyster sauce. Cocktail sauce. Horseradish. Ketchup and mustard. Meat flavorings and tenderizers. Bouillon cubes. Hot sauce. Tabasco sauce. Marinades. Taco seasonings. Relishes. Fats and Oils Butter,  stick margarine, lard, shortening, ghee, and bacon fat. Coconut, palm kernel, or palm oils. Regular salad dressings. Other Pickles and olives. Salted popcorn and pretzels. The items listed above may not be a complete list of foods and beverages to avoid. Contact your dietitian for more information. WHERE CAN I FIND MORE INFORMATION? National Heart, Lung, and Blood Institute: travelstabloid.com Document Released: 03/12/2011 Document Revised: 08/07/2013 Document Reviewed: 01/25/2013 [facility] Patient Information 2015 [place], [place]. This information is not intended to replace advice given to you by your health care provider. Make sure you discuss any questions you have with your health care provider.

## 2020-08-09 ENCOUNTER — Encounter: Payer: Self-pay | Admitting: Adult Health

## 2020-08-09 ENCOUNTER — Other Ambulatory Visit: Payer: Self-pay | Admitting: Adult Health

## 2020-08-09 DIAGNOSIS — D649 Anemia, unspecified: Secondary | ICD-10-CM

## 2020-08-09 DIAGNOSIS — D509 Iron deficiency anemia, unspecified: Secondary | ICD-10-CM | POA: Insufficient documentation

## 2020-08-10 ENCOUNTER — Other Ambulatory Visit: Payer: Self-pay | Admitting: Adult Health

## 2020-08-10 LAB — CBC WITH DIFFERENTIAL/PLATELET
Absolute Monocytes: 533 cells/uL (ref 200–950)
Basophils Absolute: 52 cells/uL (ref 0–200)
Basophils Relative: 0.8 %
Eosinophils Absolute: 117 cells/uL (ref 15–500)
Eosinophils Relative: 1.8 %
HCT: 41.2 % (ref 38.5–50.0)
Hemoglobin: 12.5 g/dL — ABNORMAL LOW (ref 13.2–17.1)
Lymphs Abs: 904 cells/uL (ref 850–3900)
MCH: 24.9 pg — ABNORMAL LOW (ref 27.0–33.0)
MCHC: 30.3 g/dL — ABNORMAL LOW (ref 32.0–36.0)
MCV: 82.1 fL (ref 80.0–100.0)
MPV: 10.1 fL (ref 7.5–12.5)
Monocytes Relative: 8.2 %
Neutro Abs: 4895 cells/uL (ref 1500–7800)
Neutrophils Relative %: 75.3 %
Platelets: 278 10*3/uL (ref 140–400)
RBC: 5.02 10*6/uL (ref 4.20–5.80)
RDW: 14.3 % (ref 11.0–15.0)
Total Lymphocyte: 13.9 %
WBC: 6.5 10*3/uL (ref 3.8–10.8)

## 2020-08-10 LAB — IRON,TIBC AND FERRITIN PANEL
%SAT: 11 % (calc) — ABNORMAL LOW (ref 20–48)
Ferritin: 7 ng/mL — ABNORMAL LOW (ref 24–380)
Iron: 48 ug/dL — ABNORMAL LOW (ref 50–180)
TIBC: 457 mcg/dL (calc) — ABNORMAL HIGH (ref 250–425)

## 2020-08-10 LAB — TEST AUTHORIZATION

## 2020-08-10 LAB — COMPLETE METABOLIC PANEL WITH GFR
AG Ratio: 2 (calc) (ref 1.0–2.5)
ALT: 22 U/L (ref 9–46)
AST: 26 U/L (ref 10–35)
Albumin: 4.5 g/dL (ref 3.6–5.1)
Alkaline phosphatase (APISO): 85 U/L (ref 35–144)
BUN/Creatinine Ratio: 27 (calc) — ABNORMAL HIGH (ref 6–22)
BUN: 30 mg/dL — ABNORMAL HIGH (ref 7–25)
CO2: 24 mmol/L (ref 20–32)
Calcium: 9.7 mg/dL (ref 8.6–10.3)
Chloride: 107 mmol/L (ref 98–110)
Creat: 1.13 mg/dL (ref 0.70–1.25)
GFR, Est African American: 79 mL/min/{1.73_m2} (ref >=60)
GFR, Est Non African American: 68 mL/min/{1.73_m2} (ref >=60)
Globulin: 2.3 g/dL (calc) (ref 1.9–3.7)
Glucose, Bld: 93 mg/dL (ref 65–99)
Potassium: 4.5 mmol/L (ref 3.5–5.3)
Sodium: 142 mmol/L (ref 135–146)
Total Bilirubin: 0.3 mg/dL (ref 0.2–1.2)
Total Protein: 6.8 g/dL (ref 6.1–8.1)

## 2020-08-10 LAB — MAGNESIUM: Magnesium: 2.2 mg/dL (ref 1.5–2.5)

## 2020-08-10 LAB — LIPID PANEL
Cholesterol: 137 mg/dL (ref <200)
HDL: 49 mg/dL (ref >=40)
LDL Cholesterol (Calc): 68 mg/dL (calc)
Non-HDL Cholesterol (Calc): 88 mg/dL (calc) (ref <130)
Total CHOL/HDL Ratio: 2.8 (calc) (ref <5.0)
Triglycerides: 113 mg/dL (ref <150)

## 2020-08-10 LAB — TSH: TSH: 1.14 mIU/L (ref 0.40–4.50)

## 2020-08-13 ENCOUNTER — Other Ambulatory Visit: Payer: Self-pay | Admitting: Internal Medicine

## 2020-08-23 ENCOUNTER — Other Ambulatory Visit: Payer: Self-pay

## 2020-08-23 ENCOUNTER — Ambulatory Visit (INDEPENDENT_AMBULATORY_CARE_PROVIDER_SITE_OTHER): Payer: 59

## 2020-08-23 ENCOUNTER — Other Ambulatory Visit: Payer: Self-pay | Admitting: Adult Health

## 2020-08-23 DIAGNOSIS — I10 Essential (primary) hypertension: Secondary | ICD-10-CM

## 2020-08-23 DIAGNOSIS — D509 Iron deficiency anemia, unspecified: Secondary | ICD-10-CM

## 2020-08-23 DIAGNOSIS — D649 Anemia, unspecified: Secondary | ICD-10-CM

## 2020-08-23 LAB — POC HEMOCCULT BLD/STL (HOME/3-CARD/SCREEN)
Card #2 Fecal Occult Blod, POC: NEGATIVE
Card #3 Fecal Occult Blood, POC: NEGATIVE
Fecal Occult Blood, POC: NEGATIVE

## 2020-08-23 NOTE — Progress Notes (Signed)
Patient presents to the office for a nurse visit to have BP checked. Today's reading was 102/62. Has been checking it at home and the highest has been 174/97 with the lowest being 96/58. Has checked BP both in the morning and evening. States that his mother really stresses him out so that may contribute to his elevated BP.

## 2020-10-02 ENCOUNTER — Ambulatory Visit (INDEPENDENT_AMBULATORY_CARE_PROVIDER_SITE_OTHER): Payer: 59 | Admitting: Dermatology

## 2020-10-02 ENCOUNTER — Other Ambulatory Visit: Payer: Self-pay

## 2020-10-02 DIAGNOSIS — L820 Inflamed seborrheic keratosis: Secondary | ICD-10-CM

## 2020-10-02 DIAGNOSIS — Z1283 Encounter for screening for malignant neoplasm of skin: Secondary | ICD-10-CM

## 2020-10-02 DIAGNOSIS — D485 Neoplasm of uncertain behavior of skin: Secondary | ICD-10-CM

## 2020-10-02 DIAGNOSIS — L821 Other seborrheic keratosis: Secondary | ICD-10-CM

## 2020-10-02 DIAGNOSIS — L814 Other melanin hyperpigmentation: Secondary | ICD-10-CM | POA: Diagnosis not present

## 2020-10-02 DIAGNOSIS — L980 Pyogenic granuloma: Secondary | ICD-10-CM

## 2020-10-02 NOTE — Patient Instructions (Addendum)
   Pyogenic granuloma needs 30 min surgical ov     Biopsy, Surgery (Curettage) & Surgery (Excision) Aftercare Instructions  1. Okay to remove bandage in 24 hours  2. Wash area with soap and water  3. Apply Vaseline to area twice daily until healed (Not Neosporin)  4. Okay to cover with a Band-Aid to decrease the chance of infection or prevent irritation from clothing; also it's okay to uncover lesion at home.  5. Suture instructions: return to our office in 7-10 or 10-14 days for a nurse visit for suture removal. Variable healing with sutures, if pain or itching occurs call our office. It's okay to shower or bathe 24 hours after sutures are given.  6. The following risks may occur after a biopsy, curettage or excision: bleeding, scarring, discoloration, recurrence, infection (redness, yellow drainage, pain or swelling).  7. For questions, concerns and results call our office at [place] before 4pm & Friday before 3pm. Biopsy results will be available in 1 week.

## 2020-10-02 NOTE — Progress Notes (Signed)
Pyogenic granuloma

## 2020-10-04 ENCOUNTER — Other Ambulatory Visit: Payer: Self-pay | Admitting: Internal Medicine

## 2020-10-10 ENCOUNTER — Other Ambulatory Visit: Payer: Self-pay | Admitting: Internal Medicine

## 2020-10-10 MED ORDER — TADALAFIL 5 MG PO TABS: ORAL_TABLET | ORAL | 3 refills | Status: DC

## 2020-10-17 ENCOUNTER — Other Ambulatory Visit: Payer: Self-pay

## 2020-10-17 ENCOUNTER — Other Ambulatory Visit: Payer: Self-pay | Admitting: Internal Medicine

## 2020-10-17 DIAGNOSIS — G43009 Migraine without aura, not intractable, without status migrainosus: Secondary | ICD-10-CM

## 2020-10-17 MED ORDER — PREGABALIN 200 MG PO CAPS: ORAL_CAPSULE | ORAL | 1 refills | Status: DC

## 2020-10-17 MED ORDER — TADALAFIL 5 MG PO TABS: ORAL_TABLET | ORAL | 3 refills | Status: DC

## 2020-10-18 ENCOUNTER — Other Ambulatory Visit: Payer: Self-pay | Admitting: Internal Medicine

## 2020-10-18 MED ORDER — DEXAMETHASONE 4 MG PO TABS: ORAL_TABLET | ORAL | 0 refills | Status: DC

## 2020-10-19 ENCOUNTER — Encounter: Payer: Self-pay | Admitting: Dermatology

## 2020-10-19 NOTE — Progress Notes (Signed)
   Follow-Up Visit   Subjective  Gerald [name] is a 65 y.o. male who presents for the following: Skin Problem (Patient might have wart on left index finger. Has like a core that pops out of it. Patient has lesion on left axilla area that has gotten bigger. No history of skin cancers. ).  Possible wart on left hand plus several areas to check. Location:  Duration:  Quality:  Associated Signs/Symptoms: Modifying Factors:  Severity:  Timing: Context:   Objective  Well appearing patient in no apparent distress; mood and affect are within normal limits. Mid Back Many with circles done by wife to check to see is they are safe to leave.  All show textured brown 3 to 6 mm flattopped papules; all dermoscopy typical of seborrheic keratoses.  Mid Forehead Four millimeter asymmetric monochrome light brown macule; dermoscopy supports benign lentigo.  Left Breast Tan-pink pearly 5 mm papule     Left Lower Back Flat pink crust     Left 2nd Finger Tip 3 mm raised vascular papule; dermoscopy shows changes compatible with pyogenic granuloma versus traumatized angioma.  No history of spontaneous bleeding.  Leave if stable or surgical visit to remove      Torso - Posterior (Back) General skin examination, no atypical pigmented lesions.    All skin waist up examined.   Assessment & Plan    Seborrheic keratosis Mid Back  Can leave unless there is clinical change.  Lentigo Mid Forehead  Recheck as needed change  Neoplasm of uncertain behavior of skin (2) Left Breast  Skin / nail biopsy Type of biopsy: tangential   Informed consent: discussed and consent obtained   Timeout: patient name, date of birth, surgical site, and procedure verified   Anesthesia: the lesion was anesthetized in a standard fashion   Anesthetic:  1% lidocaine w/ epinephrine 1-100,000 local infiltration Instrument used: flexible razor blade   Hemostasis achieved with: aluminum chloride and  electrodesiccation   Outcome: patient tolerated procedure well   Post-procedure details: wound care instructions given    Specimen 1 - Surgical pathology Differential Diagnosis: seb k Check Margins: No  Left Lower Back  Skin / nail biopsy Type of biopsy: tangential   Informed consent: discussed and consent obtained   Timeout: patient name, date of birth, surgical site, and procedure verified   Anesthesia: the lesion was anesthetized in a standard fashion   Anesthetic:  1% lidocaine w/ epinephrine 1-100,000 local infiltration Instrument used: flexible razor blade   Hemostasis achieved with: aluminum chloride and electrodesiccation   Outcome: patient tolerated procedure well   Post-procedure details: wound care instructions given    Specimen 2 - Surgical pathology Differential Diagnosis: seb k   Check Margins: No  Pyogenic granuloma Left 2nd Finger Tip  May choose to schedule time for surgical removal.  Encounter for screening for malignant neoplasm of skin Torso - Posterior (Back)  Annual skin examination, self examine with spouse twice annually.      I, Lavonna Monarch, MD, have reviewed all documentation for this visit.  The documentation on 10/19/20 for the exam, diagnosis, procedures, and orders are all accurate and complete.

## 2020-10-22 ENCOUNTER — Other Ambulatory Visit: Payer: Self-pay | Admitting: Internal Medicine

## 2020-10-22 MED ORDER — PROMETHAZINE-DM 6.25-15 MG/5ML PO SYRP: ORAL_SOLUTION | ORAL | 1 refills | Status: DC

## 2020-10-22 MED ORDER — BENZONATATE 200 MG PO CAPS: ORAL_CAPSULE | ORAL | 1 refills | Status: DC

## 2020-11-13 ENCOUNTER — Telehealth: Payer: Self-pay

## 2020-11-13 NOTE — Telephone Encounter (Signed)
Patient is requesting a refill for Zomig, '5mg'$ . The headache center that was prescribing this is no longer in network. Next appointment in our office in next year. Requesting this be sent to [facility].

## 2020-11-14 ENCOUNTER — Other Ambulatory Visit: Payer: Self-pay

## 2020-11-14 DIAGNOSIS — G43009 Migraine without aura, not intractable, without status migrainosus: Secondary | ICD-10-CM

## 2020-11-14 MED ORDER — ZOLMITRIPTAN 5 MG PO TABS: ORAL_TABLET | ORAL | 2 refills | Status: DC

## 2020-11-14 MED ORDER — ROSUVASTATIN CALCIUM 40 MG PO TABS: 40.0000 mg | ORAL_TABLET | Freq: Every day | ORAL | 2 refills | Status: DC

## 2020-11-14 NOTE — Telephone Encounter (Signed)
New script sent in

## 2020-11-26 ENCOUNTER — Other Ambulatory Visit (INDEPENDENT_AMBULATORY_CARE_PROVIDER_SITE_OTHER): Payer: 59

## 2020-11-26 ENCOUNTER — Other Ambulatory Visit: Payer: Self-pay

## 2020-11-26 DIAGNOSIS — D509 Iron deficiency anemia, unspecified: Secondary | ICD-10-CM

## 2020-11-26 LAB — CBC WITH DIFFERENTIAL/PLATELET
Absolute Monocytes: 571 cells/uL (ref 200–950)
Basophils Absolute: 92 cells/uL (ref 0–200)
Basophils Relative: 1.8 %
Eosinophils Absolute: 158 cells/uL (ref 15–500)
Eosinophils Relative: 3.1 %
HCT: 50.2 % — ABNORMAL HIGH (ref 38.5–50.0)
Hemoglobin: 16.6 g/dL (ref 13.2–17.1)
Lymphs Abs: 1489 cells/uL (ref 850–3900)
MCH: 28.6 pg (ref 27.0–33.0)
MCHC: 33.1 g/dL (ref 32.0–36.0)
MCV: 86.4 fL (ref 80.0–100.0)
MPV: 9.3 fL (ref 7.5–12.5)
Monocytes Relative: 11.2 %
Neutro Abs: 2790 cells/uL (ref 1500–7800)
Neutrophils Relative %: 54.7 %
Platelets: 322 10*3/uL (ref 140–400)
RBC: 5.81 10*6/uL — ABNORMAL HIGH (ref 4.20–5.80)
RDW: 14.2 % (ref 11.0–15.0)
Total Lymphocyte: 29.2 %
WBC: 5.1 10*3/uL (ref 3.8–10.8)

## 2020-12-04 ENCOUNTER — Other Ambulatory Visit: Payer: Self-pay | Admitting: Adult Health

## 2020-12-13 ENCOUNTER — Other Ambulatory Visit: Payer: Self-pay | Admitting: Adult Health

## 2020-12-25 ENCOUNTER — Other Ambulatory Visit: Payer: Self-pay | Admitting: Internal Medicine

## 2020-12-31 ENCOUNTER — Ambulatory Visit: Payer: BLUE CROSS/BLUE SHIELD | Admitting: Dermatology

## 2021-01-21 ENCOUNTER — Other Ambulatory Visit: Payer: Self-pay | Admitting: Orthopedic Surgery

## 2021-01-21 DIAGNOSIS — G8929 Other chronic pain: Secondary | ICD-10-CM

## 2021-01-28 ENCOUNTER — Ambulatory Visit: Admission: RE | Admit: 2021-01-28 | Payer: 59 | Source: Ambulatory Visit

## 2021-01-30 ENCOUNTER — Other Ambulatory Visit: Payer: Self-pay

## 2021-01-30 ENCOUNTER — Ambulatory Visit
Admission: RE | Admit: 2021-01-30 | Discharge: 2021-01-30 | Disposition: A | Discharge status: Home / Self Care | Payer: 59 | Source: Ambulatory Visit | Attending: Orthopedic Surgery | Admitting: Orthopedic Surgery

## 2021-01-30 DIAGNOSIS — G8929 Other chronic pain: Secondary | ICD-10-CM

## 2021-01-30 DIAGNOSIS — M25561 Pain in right knee: Secondary | ICD-10-CM | POA: Insufficient documentation

## 2021-02-07 ENCOUNTER — Encounter (HOSPITAL_BASED_OUTPATIENT_CLINIC_OR_DEPARTMENT_OTHER): Payer: Self-pay | Admitting: Orthopedic Surgery

## 2021-02-07 DIAGNOSIS — S83241A Other tear of medial meniscus, current injury, right knee, initial encounter: Secondary | ICD-10-CM

## 2021-02-07 HISTORY — DX: Other tear of medial meniscus, current injury, right knee, initial encounter: S83.241A

## 2021-02-07 NOTE — H&P (Signed)
Gerald [name] is an 65 y.o. male.   Chief Complaint: RIGHT KNEE PAIN HPI: Gerald [name] is a 65 year-old who injured his right knee running in July.  He took some time off and then started to try and run again.  He had more episodes of sharp stabbing pain.  He took more time off and had to run in the rain and had more sharp stabbing pain.  Medial locking, catching and popping in the knee.  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   Past Medical History:  Diagnosis Date   Acute medial meniscus tear of right knee 02/07/2021   Hyperlipidemia    Hypertension    Pre-diabetes    no meds   Vitamin D deficiency     Past Surgical History:  Procedure Laterality Date   ESOPHAGOGASTRODUODENOSCOPY     KNEE SURGERY     left   ORIF ACETABULAR FRACTURE     left arm 2 times    Family History  Problem Relation Age of Onset   Hypertension Mother    Hyperlipidemia Mother    Diabetes Mother    Hypertension Father    Social History:  reports that he has never smoked. He has never used smokeless tobacco. He reports current alcohol use of about 2.0 - 5.0 standard drinks per week. He reports that he does not use drugs.  Allergies: No Known Allergies  No medications prior to admission.    No results found for this or any previous visit (from the past 48 hour(s)). No results found.  Review of Systems  Constitutional: Negative.   HENT: Negative.    Eyes: Negative.   Respiratory: Negative.    Cardiovascular: Negative.   Gastrointestinal: Negative.   Endocrine: Negative.   Genitourinary: Negative.   Musculoskeletal:  Positive for gait problem and joint swelling.  Skin: Negative.    There were no vitals taken for this visit. Physical Exam Constitutional:      Appearance: Normal appearance.  HENT:     Head: Normocephalic.     Right Ear: External ear normal.     Left Ear: External ear normal.     Nose: Nose normal.     Mouth/Throat:     Pharynx:  Oropharynx is clear.  Eyes:     Conjunctiva/sclera: Conjunctivae normal.  Cardiovascular:     Rate and Rhythm: Normal rate.  Pulmonary:     Effort: Pulmonary effort is normal.  Abdominal:     Palpations: Abdomen is soft.  Musculoskeletal:     Cervical back: Neck supple.  Skin:    General: Skin is warm.     Capillary Refill: Capillary refill takes less than 2 seconds.  Neurological:     General: No focal deficit present.     Mental Status: He is alert.  Psychiatric:        Mood and Affect: Mood normal.     Assessment Principal Problem:   Acute medial meniscus tear of right knee Active Problems:   Hypertension   Hyperlipidemia   Prediabetes   Testosterone Deficiency   Overweight (BMI 25.0-29.9)   Sinus bradycardia   BPH (benign prostatic hyperplasia)    Plan I spoke to Mr. Gerald [name] concerning his right knee MRI that has revealed a medial meniscus tear with chondromalacia.  He continues to have sharp stabbing pain in his knee.  I have told him with these findings would recommend that we proceed with right knee arthroscopy with partial medial  meniscectomy.  Risks, complications and benefits of the surgery have been described to him in detail and he understands this completely.    Auda Finfrock J Madaleine Simmon, PA-C 02/07/2021, 1:59 PM

## 2021-02-13 ENCOUNTER — Encounter (HOSPITAL_BASED_OUTPATIENT_CLINIC_OR_DEPARTMENT_OTHER): Payer: Self-pay | Admitting: Orthopedic Surgery

## 2021-02-20 ENCOUNTER — Ambulatory Visit (HOSPITAL_BASED_OUTPATIENT_CLINIC_OR_DEPARTMENT_OTHER): Payer: 59 | Admitting: Certified Registered"

## 2021-02-20 ENCOUNTER — Encounter (HOSPITAL_BASED_OUTPATIENT_CLINIC_OR_DEPARTMENT_OTHER): Payer: Self-pay | Admitting: Orthopedic Surgery

## 2021-02-20 ENCOUNTER — Ambulatory Visit (HOSPITAL_BASED_OUTPATIENT_CLINIC_OR_DEPARTMENT_OTHER)
Admission: RE | Admit: 2021-02-20 | Discharge: 2021-02-20 | Disposition: A | Discharge status: Home / Self Care | Payer: 59 | Attending: Orthopedic Surgery | Admitting: Orthopedic Surgery

## 2021-02-20 ENCOUNTER — Encounter (HOSPITAL_BASED_OUTPATIENT_CLINIC_OR_DEPARTMENT_OTHER)
Admission: RE | Disposition: A | Discharge status: Home / Self Care | Payer: Self-pay | Source: Home / Self Care | Attending: Orthopedic Surgery

## 2021-02-20 DIAGNOSIS — N400 Enlarged prostate without lower urinary tract symptoms: Secondary | ICD-10-CM | POA: Diagnosis present

## 2021-02-20 DIAGNOSIS — X509XXA Other and unspecified overexertion or strenuous movements or postures, initial encounter: Secondary | ICD-10-CM | POA: Insufficient documentation

## 2021-02-20 DIAGNOSIS — S83241A Other tear of medial meniscus, current injury, right knee, initial encounter: Secondary | ICD-10-CM | POA: Insufficient documentation

## 2021-02-20 DIAGNOSIS — E782 Mixed hyperlipidemia: Secondary | ICD-10-CM | POA: Diagnosis present

## 2021-02-20 DIAGNOSIS — Y9302 Activity, running: Secondary | ICD-10-CM | POA: Insufficient documentation

## 2021-02-20 DIAGNOSIS — R7303 Prediabetes: Secondary | ICD-10-CM | POA: Diagnosis not present

## 2021-02-20 DIAGNOSIS — M94261 Chondromalacia, right knee: Secondary | ICD-10-CM | POA: Insufficient documentation

## 2021-02-20 DIAGNOSIS — E291 Testicular hypofunction: Secondary | ICD-10-CM | POA: Diagnosis present

## 2021-02-20 DIAGNOSIS — S83281A Other tear of lateral meniscus, current injury, right knee, initial encounter: Secondary | ICD-10-CM | POA: Diagnosis not present

## 2021-02-20 DIAGNOSIS — R001 Bradycardia, unspecified: Secondary | ICD-10-CM | POA: Diagnosis present

## 2021-02-20 DIAGNOSIS — I10 Essential (primary) hypertension: Secondary | ICD-10-CM | POA: Diagnosis not present

## 2021-02-20 DIAGNOSIS — E663 Overweight: Secondary | ICD-10-CM | POA: Diagnosis present

## 2021-02-20 DIAGNOSIS — E785 Hyperlipidemia, unspecified: Secondary | ICD-10-CM | POA: Diagnosis present

## 2021-02-20 HISTORY — DX: Headache, unspecified: R51.9

## 2021-02-20 HISTORY — PX: KNEE ARTHROSCOPY WITH MEDIAL MENISECTOMY: SHX5651

## 2021-02-20 SURGERY — ARTHROSCOPY, KNEE, WITH MEDIAL MENISCECTOMY
Anesthesia: General | Site: Knee | Laterality: Right

## 2021-02-20 MED ORDER — KETOROLAC TROMETHAMINE 30 MG/ML IJ SOLN: 30.0000 mg | Freq: Once | INTRAMUSCULAR | Status: DC | PRN

## 2021-02-20 MED ORDER — HYDROMORPHONE HCL 1 MG/ML IJ SOLN: 0.2500 mg | INTRAMUSCULAR | Status: DC | PRN

## 2021-02-20 MED ORDER — OXYCODONE HCL 5 MG PO TABS: 5.0000 mg | ORAL_TABLET | Freq: Once | ORAL | Status: DC | PRN

## 2021-02-20 MED ORDER — EPHEDRINE 5 MG/ML INJ
INTRAVENOUS | Status: AC
  Filled 2021-02-20: qty 5

## 2021-02-20 MED ORDER — ATROPINE SULFATE 0.4 MG/ML IV SOLN
INTRAVENOUS | Status: AC
  Filled 2021-02-20: qty 1

## 2021-02-20 MED ORDER — BUPIVACAINE-EPINEPHRINE (PF) 0.25% -1:200000 IJ SOLN
INTRAMUSCULAR | Status: AC
  Filled 2021-02-20: qty 30

## 2021-02-20 MED ORDER — CEFAZOLIN SODIUM-DEXTROSE 2-4 GM/100ML-% IV SOLN
2.0000 g | INTRAVENOUS | Status: AC
  Administered 2021-02-20: 07:00:00 2 g via INTRAVENOUS

## 2021-02-20 MED ORDER — ONDANSETRON HCL 4 MG/2ML IJ SOLN
INTRAMUSCULAR | Status: AC
  Filled 2021-02-20: qty 2

## 2021-02-20 MED ORDER — ROCURONIUM BROMIDE 10 MG/ML (PF) SYRINGE
PREFILLED_SYRINGE | INTRAVENOUS | Status: AC
  Filled 2021-02-20: qty 10

## 2021-02-20 MED ORDER — FENTANYL CITRATE (PF) 100 MCG/2ML IJ SOLN
INTRAMUSCULAR | Status: AC
  Filled 2021-02-20: qty 2

## 2021-02-20 MED ORDER — MIDAZOLAM HCL 2 MG/2ML IJ SOLN
INTRAMUSCULAR | Status: AC
  Filled 2021-02-20: qty 2

## 2021-02-20 MED ORDER — ONDANSETRON HCL 4 MG/2ML IJ SOLN
INTRAMUSCULAR | Status: DC | PRN
  Administered 2021-02-20: 4 mg via INTRAVENOUS

## 2021-02-20 MED ORDER — SODIUM CHLORIDE 0.9 % IR SOLN
Status: DC | PRN
  Administered 2021-02-20: 08:00:00 3000 mL

## 2021-02-20 MED ORDER — DEXAMETHASONE SODIUM PHOSPHATE 10 MG/ML IJ SOLN
INTRAMUSCULAR | Status: AC
  Filled 2021-02-20: qty 1

## 2021-02-20 MED ORDER — LACTATED RINGERS IV SOLN: INTRAVENOUS | Status: DC

## 2021-02-20 MED ORDER — PROPOFOL 10 MG/ML IV BOLUS
INTRAVENOUS | Status: DC | PRN
  Administered 2021-02-20: 150 mg via INTRAVENOUS

## 2021-02-20 MED ORDER — MEPERIDINE HCL 25 MG/ML IJ SOLN: 6.2500 mg | INTRAMUSCULAR | Status: DC | PRN

## 2021-02-20 MED ORDER — PROMETHAZINE HCL 25 MG/ML IJ SOLN: 6.2500 mg | INTRAMUSCULAR | Status: DC | PRN

## 2021-02-20 MED ORDER — OXYCODONE HCL 5 MG/5ML PO SOLN: 5.0000 mg | Freq: Once | ORAL | Status: DC | PRN

## 2021-02-20 MED ORDER — POVIDONE-IODINE 10 % EX SWAB
2.0000 | Freq: Once | CUTANEOUS | Status: DC
Start: 2021-02-20 — End: 2021-02-20

## 2021-02-20 MED ORDER — LIDOCAINE 2% (20 MG/ML) 5 ML SYRINGE
INTRAMUSCULAR | Status: AC
  Filled 2021-02-20: qty 5

## 2021-02-20 MED ORDER — LACTATED RINGERS IV SOLN
INTRAVENOUS | Status: DC
Start: 2021-02-20 — End: 2021-02-20

## 2021-02-20 MED ORDER — DEXAMETHASONE SODIUM PHOSPHATE 4 MG/ML IJ SOLN
INTRAMUSCULAR | Status: DC | PRN
Start: 2021-02-20 — End: 2021-02-20
  Administered 2021-02-20: 10 mg via INTRAVENOUS

## 2021-02-20 MED ORDER — POVIDONE-IODINE 7.5 % EX SOLN: Freq: Once | CUTANEOUS | Status: DC

## 2021-02-20 MED ORDER — LIDOCAINE HCL (CARDIAC) PF 100 MG/5ML IV SOSY
PREFILLED_SYRINGE | INTRAVENOUS | Status: DC | PRN
  Administered 2021-02-20: 40 mg via INTRAVENOUS

## 2021-02-20 MED ORDER — HYDROCODONE-ACETAMINOPHEN 5-325 MG PO TABS: 1.0000 | ORAL_TABLET | ORAL | 0 refills | Status: DC | PRN

## 2021-02-20 MED ORDER — GLYCOPYRROLATE PF 0.2 MG/ML IJ SOSY
PREFILLED_SYRINGE | INTRAMUSCULAR | Status: AC
  Filled 2021-02-20: qty 1

## 2021-02-20 MED ORDER — BUPIVACAINE-EPINEPHRINE 0.25% -1:200000 IJ SOLN
INTRAMUSCULAR | Status: DC | PRN
  Administered 2021-02-20: 20 mL

## 2021-02-20 MED ORDER — MIDAZOLAM HCL 5 MG/5ML IJ SOLN
INTRAMUSCULAR | Status: DC | PRN
  Administered 2021-02-20: 2 mg via INTRAVENOUS

## 2021-02-20 MED ORDER — FENTANYL CITRATE (PF) 100 MCG/2ML IJ SOLN
INTRAMUSCULAR | Status: DC | PRN
  Administered 2021-02-20: 100 ug via INTRAVENOUS

## 2021-02-20 MED ORDER — CEFAZOLIN SODIUM-DEXTROSE 2-4 GM/100ML-% IV SOLN
INTRAVENOUS | Status: AC
  Filled 2021-02-20: qty 100

## 2021-02-20 MED ORDER — EPHEDRINE SULFATE 50 MG/ML IJ SOLN
INTRAMUSCULAR | Status: DC | PRN
  Administered 2021-02-20: 15 mg via INTRAVENOUS

## 2021-02-20 SURGICAL SUPPLY — 35 items
BLADE EXCALIBUR 4.0X13 (MISCELLANEOUS) IMPLANT
BNDG COHESIVE 4X5 TAN ST LF (GAUZE/BANDAGES/DRESSINGS) IMPLANT
BNDG ELASTIC 6X5.8 VLCR STR LF (GAUZE/BANDAGES/DRESSINGS) ×2 IMPLANT
DISSECTOR  3.8MM X 13CM (MISCELLANEOUS) ×2
DISSECTOR 3.8MM X 13CM (MISCELLANEOUS) ×1 IMPLANT
DISSECTOR 4.0MM X 13CM (MISCELLANEOUS) IMPLANT
DRAPE ARTHROSCOPY W/POUCH 90 (DRAPES) ×2 IMPLANT
DURAPREP 26ML APPLICATOR (WOUND CARE) ×2 IMPLANT
EXCALIBUR 3.8MM X 13CM (MISCELLANEOUS) IMPLANT
GAUZE SPONGE 4X4 12PLY STRL (GAUZE/BANDAGES/DRESSINGS) ×2 IMPLANT
GAUZE XEROFORM 1X8 LF (GAUZE/BANDAGES/DRESSINGS) ×2 IMPLANT
GLOVE SURG ENC MOIS LTX SZ7 (GLOVE) ×2 IMPLANT
GLOVE SURG MICRO LTX SZ7.5 (GLOVE) ×2 IMPLANT
GLOVE SURG UNDER POLY LF SZ7 (GLOVE) ×2 IMPLANT
GLOVE SURG UNDER POLY LF SZ7.5 (GLOVE) ×2 IMPLANT
GOWN STRL REUS W/ TWL LRG LVL3 (GOWN DISPOSABLE) ×2 IMPLANT
GOWN STRL REUS W/ TWL XL LVL3 (GOWN DISPOSABLE) ×1 IMPLANT
GOWN STRL REUS W/TWL LRG LVL3 (GOWN DISPOSABLE) ×4
GOWN STRL REUS W/TWL XL LVL3 (GOWN DISPOSABLE) ×2
MANIFOLD NEPTUNE II (INSTRUMENTS) ×2 IMPLANT
NDL SAFETY ECLIPSE 18X1.5 (NEEDLE) ×2 IMPLANT
NEEDLE HYPO 18GX1.5 SHARP (NEEDLE) ×4
NEEDLE HYPO 22GX1.5 SAFETY (NEEDLE) ×2 IMPLANT
PACK ARTHROSCOPY DSU (CUSTOM PROCEDURE TRAY) ×2 IMPLANT
PACK BASIN DAY SURGERY FS (CUSTOM PROCEDURE TRAY) ×2 IMPLANT
PAD ALCOHOL SWAB (MISCELLANEOUS) IMPLANT
PENCIL SMOKE EVACUATOR (MISCELLANEOUS) ×2 IMPLANT
PORT APPOLLO RF 90DEGREE MULTI (SURGICAL WAND) IMPLANT
SUT ETHILON 4 0 PS 2 18 (SUTURE) ×2 IMPLANT
SYR 20ML LL LF (SYRINGE) IMPLANT
SYR 5ML LL (SYRINGE) ×2 IMPLANT
TOWEL GREEN STERILE FF (TOWEL DISPOSABLE) ×2 IMPLANT
TUBING ARTHROSCOPY IRRIG 16FT (MISCELLANEOUS) ×2 IMPLANT
WATER STERILE IRR 1000ML POUR (IV SOLUTION) ×2 IMPLANT
WRAP KNEE MAXI GEL POST OP (GAUZE/BANDAGES/DRESSINGS) ×2 IMPLANT

## 2021-02-20 NOTE — Anesthesia Preprocedure Evaluation (Signed)
Anesthesia Evaluation  Patient identified by MRN, date of birth, ID band Patient awake    Reviewed: Allergy & Precautions, H&P , NPO status , Patient's Chart, lab work & pertinent test results  Airway Mallampati: II  TM Distance: >3 FB Neck ROM: Full    Dental no notable dental hx.    Pulmonary neg pulmonary ROS,    Pulmonary exam normal breath sounds clear to auscultation       Cardiovascular hypertension (156/87 in preop, normally 120s SBP per pt), Pt. on medications Normal cardiovascular exam Rhythm:Regular Rate:Normal     Neuro/Psych  Headaches, negative psych ROS   GI/Hepatic negative GI ROS, Neg liver ROS,   Endo/Other  diabetes (pre-diabetic no meds)  Renal/GU negative Renal ROS  negative genitourinary   Musculoskeletal Right meniscus tear    Abdominal   Peds negative pediatric ROS (+)  Hematology negative hematology ROS (+)   Anesthesia Other Findings   Reproductive/Obstetrics negative OB ROS                             Anesthesia Physical Anesthesia Plan  ASA: 2  Anesthesia Plan: General   Post-op Pain Management:    Induction: Intravenous  PONV Risk Score and Plan: 2 and Ondansetron, Dexamethasone, Midazolam and Treatment may vary due to age or medical condition  Airway Management Planned: LMA  Additional Equipment: None  Intra-op Plan:   Post-operative Plan: Extubation in OR  Informed Consent: I have reviewed the patients History and Physical, chart, labs and discussed the procedure including the risks, benefits and alternatives for the proposed anesthesia with the patient or authorized representative who has indicated his/her understanding and acceptance.     Dental advisory given  Plan Discussed with: CRNA  Anesthesia Plan Comments:         Anesthesia Quick Evaluation

## 2021-02-20 NOTE — Interval H&P Note (Signed)
History and Physical Interval Note:  02/20/2021 6:38 AM  Gerald [name]  has presented today for surgery, with the diagnosis of right knee meniscus injury.  The various methods of treatment have been discussed with the patient and family. After consideration of risks, benefits and other options for treatment, the patient has consented to  Procedure(s): KNEE ARTHROSCOPY WITH MEDIAL MENISECTOMY (Right) as a surgical intervention.  The patient's history has been reviewed, patient examined, no change in status, stable for surgery.  I have reviewed the patient's chart and labs.  Questions were answered to the patient's satisfaction.     Lorn Junes

## 2021-02-20 NOTE — Op Note (Signed)
NAME: Gerald [name], Gerald [name] MEDICAL RECORD NO: [id number] ACCOUNT NO: [id number] DATE OF BIRTH: [birth year] FACILITY: [facility] LOCATION: [facility] PHYSICIAN: Audree Camel. Noemi Chapel, MD  Operative Report   DATE OF PROCEDURE: 02/20/2021  PREOPERATIVE DIAGNOSIS:  Right knee acute traumatic medial and lateral meniscal tears.  POSTOPERATIVE DIAGNOSIS:  Right knee acute traumatic medial and lateral meniscal tears.  PROCEDURE:  Right knee EUA followed by arthroscopic partial medial and lateral meniscectomies with chondroplasty.  SURGEON:  Elsie Saas, MD  ASSISTANT:  Matthew Saras, PA  ANESTHESIA:  General.  OPERATIVE TIME: 30 minutes.  COMPLICATIONS:  None.  INDICATIONS FOR PROCEDURE:  The patient is a 65 year old who sustained a twisting injury to his right knee approximately 8 weeks ago.  Exam, x-rays and MRI revealed medial and lateral meniscal tears.  He has failed conservative care, and is now to  undergo arthroscopy.  DESCRIPTION OF PROCEDURE:  The patient is brought to the operating room on 02/20/2021. He was placed on the operating table in supine position.  After being placed under general anesthesia, his right knee was examined.  He had full range of motion.  He  had a stable ligamentous exam with normal patellar tracking.  The knee was sterilely injected with 0.25% Marcaine with epinephrine.  Right leg was prepped using sterile DuraPrep and draped using sterile technique.  Timeout procedure was called and the  correct right knee identified. Initially through an anterolateral portal, the arthroscope with the pump attached was placed and through an anteromedial portal, an arthroscopic probe was placed.  On initial inspection of the medial compartment, he had  30-40%, grade III chondromalacia, which was debrided.  Medial meniscus showed tearing of the posterior medial horn of which 50% was resected back to a stable rim.  ACL and PCL were normal.  Lateral compartment, articular cartilage  showed grade I and II  chondromalacia, lateral meniscus tear 20%, posterior and lateral horn was resected back to a stable rim.  Patellofemoral joint showed grade I to II chondromalacia.  The patella tracked normally.  Medial and lateral gutters were free of pathology.  The  knee was then injected with 10 mL of Marcaine with epinephrine.  The wounds were closed with 3-0 nylon suture.  Sterile dressings were applied and the patient was awakened and taken to recovery room in stable condition.  FOLLOWUP CARE:  The patient is to be followed as an outpatient on Norco for pain.  I will see him back in the office in a week for wound check and followup.   MUK D: 02/20/2021 8:36:41 am T: 02/20/2021 8:57:00 am  JOB: [id number]/ [id number]

## 2021-02-20 NOTE — Transfer of Care (Signed)
Immediate Anesthesia Transfer of Care Note  Patient: Gerald [name]  Procedure(s) Performed: KNEE ARTHROSCOPY WITH MEDIAL MENISECTOMY (Right: Knee)  Patient Location: PACU  Anesthesia Type:General  Level of Consciousness: sedated  Airway & Oxygen Therapy: Patient Spontanous Breathing and Patient connected to face mask oxygen  Post-op Assessment: Report given to RN and Post -op Vital signs reviewed and stable  Post vital signs: Reviewed and stable  Last Vitals:  Vitals Value Taken Time  BP 103/91 02/20/21 0755  Temp    Pulse 66 02/20/21 0756  Resp 8 02/20/21 0756  SpO2 96 % 02/20/21 0756  Vitals shown include unvalidated device data.  Last Pain:  Vitals:   02/20/21 0643  TempSrc: Oral  PainSc: 0-No pain         Complications: No notable events documented.

## 2021-02-20 NOTE — Discharge Instructions (Signed)

## 2021-02-20 NOTE — Anesthesia Procedure Notes (Signed)
Procedure Name: LMA Insertion Date/Time: 02/20/2021 7:31 AM Performed by: Tawni Millers, CRNA Pre-anesthesia Checklist: Patient identified, Emergency Drugs available, Suction available and Patient being monitored Patient Re-evaluated:Patient Re-evaluated prior to induction Oxygen Delivery Method: Circle system utilized Preoxygenation: Pre-oxygenation with 100% oxygen Induction Type: IV induction Ventilation: Mask ventilation without difficulty LMA: LMA inserted LMA Size: 4.0 Number of attempts: 1 Airway Equipment and Method: Bite block Placement Confirmation: positive ETCO2 Tube secured with: Tape Dental Injury: Teeth and Oropharynx as per pre-operative assessment

## 2021-02-20 NOTE — Anesthesia Postprocedure Evaluation (Signed)
Anesthesia Post Note  Patient: Gerald [name]  Procedure(s) Performed: KNEE ARTHROSCOPY WITH MEDIAL MENISECTOMY (Right: Knee)     Patient location during evaluation: PACU Anesthesia Type: General Level of consciousness: awake and alert, oriented and patient cooperative Pain management: pain level controlled Vital Signs Assessment: post-procedure vital signs reviewed and stable Respiratory status: spontaneous breathing, nonlabored ventilation and respiratory function stable Cardiovascular status: blood pressure returned to baseline and stable Postop Assessment: no apparent nausea or vomiting Anesthetic complications: no   No notable events documented.  Last Vitals:  Vitals:   02/20/21 0815 02/20/21 0831  BP: 139/85 (!) 165/99  Pulse: 69 70  Resp: 11 14  Temp:  36.5 C  SpO2: 95% 95%    Last Pain:  Vitals:   02/20/21 0831  TempSrc:   PainSc: 0-No pain                 Pervis Hocking

## 2021-02-21 ENCOUNTER — Encounter (HOSPITAL_BASED_OUTPATIENT_CLINIC_OR_DEPARTMENT_OTHER): Payer: Self-pay | Admitting: Orthopedic Surgery

## 2021-03-07 ENCOUNTER — Other Ambulatory Visit: Payer: Self-pay | Admitting: Nurse Practitioner

## 2021-05-07 ENCOUNTER — Other Ambulatory Visit: Payer: Self-pay

## 2021-05-07 DIAGNOSIS — G43009 Migraine without aura, not intractable, without status migrainosus: Secondary | ICD-10-CM

## 2021-05-07 MED ORDER — PREGABALIN 200 MG PO CAPS: ORAL_CAPSULE | ORAL | 1 refills | Status: DC

## 2021-05-08 ENCOUNTER — Other Ambulatory Visit: Payer: Self-pay | Admitting: Adult Health

## 2021-05-08 DIAGNOSIS — G43009 Migraine without aura, not intractable, without status migrainosus: Secondary | ICD-10-CM

## 2021-05-08 MED ORDER — PREGABALIN 200 MG PO CAPS: ORAL_CAPSULE | ORAL | 1 refills | Status: DC

## 2021-05-14 ENCOUNTER — Encounter: Payer: Self-pay | Admitting: Internal Medicine

## 2021-05-14 NOTE — Progress Notes (Signed)
Annual  Screening/Preventative Visit  & Comprehensive Evaluation & Examination  Future Appointments  Date Time Provider Department  05/15/2021  2:00 PM Unk [name], MD [facility]  05/21/2022  2:00 PM Unk [name], MD [facility]            This very nice 67 y.o.  [name] presents for a Screening /Preventative Visit & comprehensive evaluation and management of multiple medical co-morbidities.  Patient has been followed for HTN, HLD, Prediabetes and Vitamin D Deficiency.       Today, he's also c/o 3-4 day prodrome of HA & sinus congestion / sinus pressure in his cheeks, post nasal drainage of a greenish yellow PND and dry cough. No fever, chills , sweats or dyspnea.        HTN predates since  1999. Patient's BP has been controlled at home.  Today's BP was initially elevated & rechecked at goal - 136/66.    In 2008,  patient had a Negative Heart Cath after a false (+) Myoview. Patient denies any cardiac symptoms as chest pain, palpitations, shortness of breath, dizziness or ankle swelling.       Patient's hyperlipidemia is controlled with diet and Rosuvastatin. Patient denies myalgias or other medication SE's. Last lipids were at goal :  Lab Results  Component Value Date   CHOL 137 08/08/2020   HDL 49 08/08/2020   LDLCALC 68 08/08/2020   TRIG 113 08/08/2020   CHOLHDL 2.8 08/08/2020         Patient has hx/o prediabetes (A1c 5.7% /2011) and patient denies reactive hypoglycemic symptoms, visual blurring, diabetic polys or paresthesias. Last A1c was near goal :   Lab Results  Component Value Date   HGBA1C 5.7 (H) 05/08/2020                                             Patient has hx/o Testosterone Deficiency (2005) and  is on gel  replacement with improved stamina & sense of well being.         Finally, patient has history of Vitamin D Deficiency ("48"on Tx/2008 ) and last vitamin D was   Lab Results  Component Value Date   VD25OH 62 05/08/2020     Current Outpatient  Medications on File Prior to Visit  Medication Sig   ALPRAZolam 0.5 MG tablet TAKE 1/2-1 TAB 2-3X DAILY IF NEEDED    VITAMIN C 1000 MG tablet Take 2 times daily.   aspirin EC 81 MG tablet Take daily.    FIORICET  tablet TAKE 1 TAB EVERY 4 TO 6 HRS ONLY IF NEEDED    VITAMIN D 5000 u Take 5,000 Units by mouth daily.    cyclobenzaprine  10 MG tablet Take 10 mg by mouth as needed for muscle spasms.   FLAX SEED OIL  Take 1,000 mg by mouth 3 (three) times daily.    Glucosamine-Chondroitin  Take by mouth. Take 2 tablets daily   NORCO  5-325 MG tablet Take 1 tablet  every 4 hours as needed for moderate pain.   ibuprofen  600 MG tablet Take 1 tablet  every 6 hours as needed.   Magnesium 500 MG TABS Take 1 tablet daily.   Melatonin 10 MG CAPS Take  Nightly.   UNIVASC  15 MG tablet TAKE 1 TABLET DAILY   FISH OIL  1,000 mg  Take  3  times daily.    pregabalin  200 MG capsule Take 1 capsule 2 x /day   ALIGN 4 MG CAPS Take 1 capsule daily for Probiotic Benefit   rosuvastatin  40 MG tablet Take 1 tablet  daily.   SUMAtriptan (ONZETRA XSAIL NA) Place 1 application into the nose See admin instructions. Once as needed with onset of migraine, up to twice per episode, 2 hours apart.   tadalafil  5 MG tablet    TAZORAC 0.05 % cream    Testosterone 10 MG/ACT (2%) GEL APPLY THE CONTENTS OF 4    PUMP ACTUATIONS DAILY TO   THIGHS   vitamin B-12  100 MCG tablet Take 100 mcg  daily.   ZOMIG 5 MG tablet 1 tab with onset of migraine, repeat in 2 hours if needed.     No Known Allergies   Past Medical History:  Diagnosis Date   Acute medial meniscus tear of right knee 02/07/2021   Headache    Hyperlipidemia    Hypertension    Pre-diabetes    no meds   Vitamin D deficiency      Health Maintenance  Topic Date Due   HIV Screening  Never done   Hepatitis C Screening  Never done   Zoster Vaccines- Shingrix (1 of 2) Never done   TETANUS/TDAP  06/16/2018   Pneumonia Vaccine 58+ Years old (1 - PCV)  05/10/2021   COLONOSCOPY  08/01/2024   INFLUENZA VACCINE  Completed   COVID-19 Vaccine  Completed   HPV VACCINES  Aged Out     Immunization History  Administered Date(s) Administered   Influenza Inj Mdck Quad  01/04/2019   Influenza Inj Mdck Quad  12/27/2017   Influenza Split 01/08/2014   Influenza Whole 01/05/2013   Influenza,inj,Quad PF,6+ Mos 01/15/2021   Influenza 01/05/2015   PFIZER SARS-COV-2 Vacc 06/05/2019, 06/27/2019, 03/09/2020   PPD Test 02/08/2018, 04/27/2019, 05/08/2020   Pfizer Covid-19 Bivalent Booster  03/06/2021   Tdap 06/15/2008    Last Colon -    Past Surgical History:  Procedure Laterality Date   ESOPHAGOGASTRODUODENOSCOPY     KNEE ARTHROSCOPY WITH MEDIAL MENISECTOMY Right 02/20/2021   Procedure: Right KNEE ARTHROSCOPY WITH MEDIAL MENISECTOMY; Elsie Saas, MD   KNEE SURGERY     left   ORIF ACETABULAR FRACTURE     left arm 2 times     Family History  Problem Relation Age of Onset   Hypertension Mother    Hyperlipidemia Mother    Diabetes Mother    Hypertension Father      Social History   Tobacco Use   Smoking status: Never   Smokeless tobacco: Never  Vaping Use   Vaping Use: Never used  Substance Use Topics   Alcohol use: Yes    Alcohol/week: 2.0 - 5.0 standard drinks    Types: 2 - 5 Standard drinks or equivalent per week   Drug use: No      ROS Constitutional: Denies fever, chills, weight loss/gain, headaches, insomnia,  night sweats or change in appetite. Does c/o fatigue. Eyes: Denies redness, blurred vision, diplopia, discharge, itchy or watery eyes.  ENT: Denies discharge, epistaxis, sore throat, earache, hearing loss, dental pain, Tinnitus, Vertigo  or snoring.  Cardio: Denies chest pain, palpitations, irregular heartbeat, syncope, dyspnea, diaphoresis, orthopnea, PND, claudication or edema Respiratory: denies cough, dyspnea, DOE, pleurisy, hoarseness, laryngitis or wheezing.  Gastrointestinal: Denies dysphagia,  heartburn, reflux, water brash, pain, cramps, nausea, vomiting, bloating, diarrhea, constipation, hematemesis, melena, hematochezia, jaundice or hemorrhoids  Genitourinary: Denies dysuria, frequency, urgency, nocturia, hesitancy, discharge, hematuria or flank pain Musculoskeletal: Denies arthralgia, myalgia, stiffness, Jt. Swelling, pain, limp or strain/sprain. Denies Falls. Skin: Denies puritis, rash, hives, warts, acne, eczema or change in skin lesion Neuro: No weakness, tremor, incoordination, spasms, paresthesia or pain Psychiatric: Denies confusion, memory loss or sensory loss. Denies Depression. Endocrine: Denies change in weight, skin, hair change, nocturia, and paresthesia, diabetic polys, visual blurring or hyper / hypo glycemic episodes.  Heme/Lymph: No excessive bleeding, bruising or enlarged lymph nodes.   Physical Exam  BP 136/66    Pulse 61    Temp 98.4 F (36.9 C)    Resp 17    Ht 5\' 8"  (1.727 m)    Wt 198 lb 3.2 oz (89.9 kg)    SpO2 97%    BMI 30.14 kg/m   General Appearance: Well nourished and well groomed and in no apparent distress.  Eyes: PERRLA, EOMs, conjunctiva no swelling or erythema, normal fundi and vessels. Sinuses:  (+) maxillary tenderness ENT/Mouth: EACs patent / TMs  nl. Nares clear without erythema, swelling, mucoid exudates. Oral hygiene is good. No erythema, swelling, or exudate. Tongue normal, non-obstructing. Tonsils not swollen or erythematous. Hearing normal.  Neck: Supple, thyroid not palpable. No bruits, nodes or JVD. Respiratory: Respiratory effort normal.  BS equal and clear bilateral without rales, rhonci, wheezing or stridor. Cardio: Heart sounds are normal with regular rate and rhythm and no murmurs, rubs or gallops. Peripheral pulses are normal and equal bilaterally without edema. No aortic or femoral bruits. Chest:  Few scattered rales clear with cough.    Abdomen: Soft, with Nl bowel sounds. Nontender, no guarding, rebound, hernias, masses, or  organomegaly.  Lymphatics: Non tender without lymphadenopathy.  Musculoskeletal: Full ROM all peripheral extremities, joint stability, 5/5 strength, and normal gait. Skin: Warm and dry without rashes, lesions, cyanosis, clubbing or  ecchymosis.  Neuro: Cranial nerves intact, reflexes equal bilaterally. Normal muscle tone, no cerebellar symptoms. Sensation intact.  Pysch: Alert and oriented X 3 with normal affect, insight and judgment appropriate.   Assessment and Plan  1. Annual Preventative/Screening Exam    2. Essential hypertension  - EKG 12-Lead - [place], RETROPERITNL ABD,  LTD - Urinalysis, Routine w reflex microscopic - Microalbumin / creatinine urine ratio - CBC with Differential/Platelet - COMPLETE METABOLIC PANEL WITH GFR - Magnesium - TSH  3. Hyperlipidemia, mixed  - EKG 12-Lead - [place], RETROPERITNL ABD,  LTD - Lipid panel  - rosuvastatin  20 MG tablet;  Take  1 tablet   Daily  for Cholesterol   Dispense: 90 tablet; Refill: 3  4. Abnormal glucose  - EKG 12-Lead - [place], RETROPERITNL ABD,  LTD - Hemoglobin A1c - Insulin, random  5. Vitamin D deficiency  - VITAMIN D 25 Hydroxy (V 6. Prediabetes  - EKG 12-Lead - [place], RETROPERITNL ABD,  LTD - Hemoglobin A1c - Insulin, random  7. Testosterone Deficiency  - Testosterone  8. Cough headache  - POC COVID-19  9. Screening for colorectal cancer  - POC Hemoccult Bld/Stl  10. BPH with obstruction/lower urinary tract symptoms  - PSA  11. Prostate cancer screening  - PSA  12. Screening for ischemic heart disease  - EKG 12-Lead  13. FH: hypertension  - EKG 12-Lead - [place], RETROPERITNL ABD,  LTD  14. Screening for AAA (aortic abdominal aneurysm)  - [place], RETROPERITNL ABD,  LTD  15. Medication management  - Urinalysis, Routine w reflex microscopic - Microalbumin / creatinine urine ratio - Testosterone -  CBC with Differential/Platelet - COMPLETE METABOLIC PANEL WITH GFR - Magnesium - Lipid panel -  TSH - Hemoglobin A1c - Insulin, random - VITAMIN D 25 Hydroxy (Vit-D Deficiency, Fractures)  16. Testosterone deficiency  - Testosterone 10 MG/ACT (2%) GEL; APPLY THE CONTENTS OF 4    PUMP ACTUATIONS DAILY TO   THIGHS  Dispense: 180 g; Refill: 1  17. Subacute maxillary sinusitis  - dexamethasone  4 MG tablet; Take 1 tab 3 x day - 3 days, then 2 x day - 3 days, then 1 tab daily  Dispense: 20 tablet; Refill: 0 - promethazine--DM) 6.25-15 MG/5ML syrup;  Take 1 tsp every 4 hours if needed for cough   Dispense: 240 mL; Refill: 1  - pseudoephedrine  120 MG 12 hr tablet;  Take  1 tablet  2 x /day (every 12 hours)   Dispense: 60 tablet           Patient was counseled in prudent diet, weight control to achieve/maintain BMI less than 25, BP monitoring, regular exercise and medications as discussed.  Discussed med effects and SE's. Routine screening labs and tests as requested with regular follow-up as recommended. Over 40 minutes of exam, counseling, chart review and high complex critical decision making was performed   Kirtland Bouchard, MD

## 2021-05-14 NOTE — Patient Instructions (Signed)

## 2021-05-15 ENCOUNTER — Encounter: Payer: Self-pay | Admitting: Internal Medicine

## 2021-05-15 ENCOUNTER — Other Ambulatory Visit: Payer: Self-pay

## 2021-05-15 ENCOUNTER — Ambulatory Visit (INDEPENDENT_AMBULATORY_CARE_PROVIDER_SITE_OTHER): Payer: Medicare HMO | Admitting: Internal Medicine

## 2021-05-15 VITALS — BP 136/66 | HR 61 | Temp 98.4°F | Resp 17 | Ht 68.0 in | Wt 198.2 lb

## 2021-05-15 DIAGNOSIS — I10 Essential (primary) hypertension: Secondary | ICD-10-CM

## 2021-05-15 DIAGNOSIS — G4483 Primary cough headache: Secondary | ICD-10-CM

## 2021-05-15 DIAGNOSIS — N138 Other obstructive and reflux uropathy: Secondary | ICD-10-CM

## 2021-05-15 DIAGNOSIS — E349 Endocrine disorder, unspecified: Secondary | ICD-10-CM

## 2021-05-15 DIAGNOSIS — Z136 Encounter for screening for cardiovascular disorders: Secondary | ICD-10-CM | POA: Diagnosis not present

## 2021-05-15 DIAGNOSIS — R7303 Prediabetes: Secondary | ICD-10-CM | POA: Diagnosis not present

## 2021-05-15 DIAGNOSIS — Z125 Encounter for screening for malignant neoplasm of prostate: Secondary | ICD-10-CM | POA: Diagnosis not present

## 2021-05-15 DIAGNOSIS — E291 Testicular hypofunction: Secondary | ICD-10-CM

## 2021-05-15 DIAGNOSIS — R7309 Other abnormal glucose: Secondary | ICD-10-CM | POA: Diagnosis not present

## 2021-05-15 DIAGNOSIS — Z0000 Encounter for general adult medical examination without abnormal findings: Secondary | ICD-10-CM

## 2021-05-15 DIAGNOSIS — Z8249 Family history of ischemic heart disease and other diseases of the circulatory system: Secondary | ICD-10-CM

## 2021-05-15 DIAGNOSIS — Z1211 Encounter for screening for malignant neoplasm of colon: Secondary | ICD-10-CM

## 2021-05-15 DIAGNOSIS — E782 Mixed hyperlipidemia: Secondary | ICD-10-CM | POA: Diagnosis not present

## 2021-05-15 DIAGNOSIS — Z79899 Other long term (current) drug therapy: Secondary | ICD-10-CM | POA: Diagnosis not present

## 2021-05-15 DIAGNOSIS — N401 Enlarged prostate with lower urinary tract symptoms: Secondary | ICD-10-CM | POA: Diagnosis not present

## 2021-05-15 DIAGNOSIS — E559 Vitamin D deficiency, unspecified: Secondary | ICD-10-CM | POA: Diagnosis not present

## 2021-05-15 DIAGNOSIS — Z0001 Encounter for general adult medical examination with abnormal findings: Secondary | ICD-10-CM

## 2021-05-15 DIAGNOSIS — J0100 Acute maxillary sinusitis, unspecified: Secondary | ICD-10-CM

## 2021-05-15 MED ORDER — TESTOSTERONE 10 MG/ACT (2%) TD GEL: TRANSDERMAL | 1 refills | Status: DC

## 2021-05-15 MED ORDER — BENZONATATE 200 MG PO CAPS: ORAL_CAPSULE | ORAL | 1 refills | Status: DC

## 2021-05-15 MED ORDER — DEXAMETHASONE 4 MG PO TABS
ORAL_TABLET | ORAL | 0 refills | Status: DC
Start: 2021-05-15 — End: 2021-05-27

## 2021-05-15 MED ORDER — PROMETHAZINE-DM 6.25-15 MG/5ML PO SYRP
ORAL_SOLUTION | ORAL | 1 refills | Status: DC
Start: 2021-05-15 — End: 2021-10-22

## 2021-05-15 MED ORDER — PSEUDOEPHEDRINE HCL ER 120 MG PO TB12: ORAL_TABLET | ORAL | 0 refills | Status: DC

## 2021-05-15 MED ORDER — ROSUVASTATIN CALCIUM 20 MG PO TABS: ORAL_TABLET | ORAL | 3 refills | Status: DC

## 2021-05-16 LAB — COMPLETE METABOLIC PANEL WITH GFR
AG Ratio: 2.2 (calc) (ref 1.0–2.5)
ALT: 24 U/L (ref 9–46)
AST: 28 U/L (ref 10–35)
Albumin: 4.9 g/dL (ref 3.6–5.1)
Alkaline phosphatase (APISO): 115 U/L (ref 35–144)
BUN: 14 mg/dL (ref 7–25)
CO2: 29 mmol/L (ref 20–32)
Calcium: 9.5 mg/dL (ref 8.6–10.3)
Chloride: 102 mmol/L (ref 98–110)
Creat: 1.17 mg/dL (ref 0.70–1.35)
Globulin: 2.2 g/dL (calc) (ref 1.9–3.7)
Glucose, Bld: 88 mg/dL (ref 65–99)
Potassium: 4.5 mmol/L (ref 3.5–5.3)
Sodium: 141 mmol/L (ref 135–146)
Total Bilirubin: 0.4 mg/dL (ref 0.2–1.2)
Total Protein: 7.1 g/dL (ref 6.1–8.1)
eGFR: 69 mL/min/{1.73_m2} (ref >=60)

## 2021-05-16 LAB — CBC WITH DIFFERENTIAL/PLATELET
Absolute Monocytes: 695 cells/uL (ref 200–950)
Basophils Absolute: 48 cells/uL (ref 0–200)
Basophils Relative: 1.1 %
Eosinophils Absolute: 150 cells/uL (ref 15–500)
Eosinophils Relative: 3.4 %
HCT: 47.2 % (ref 38.5–50.0)
Hemoglobin: 15.1 g/dL (ref 13.2–17.1)
Lymphs Abs: 849 cells/uL — ABNORMAL LOW (ref 850–3900)
MCH: 27.6 pg (ref 27.0–33.0)
MCHC: 32 g/dL (ref 32.0–36.0)
MCV: 86.3 fL (ref 80.0–100.0)
MPV: 9.8 fL (ref 7.5–12.5)
Monocytes Relative: 15.8 %
Neutro Abs: 2658 cells/uL (ref 1500–7800)
Neutrophils Relative %: 60.4 %
Platelets: 241 10*3/uL (ref 140–400)
RBC: 5.47 10*6/uL (ref 4.20–5.80)
RDW: 12.7 % (ref 11.0–15.0)
Total Lymphocyte: 19.3 %
WBC: 4.4 10*3/uL (ref 3.8–10.8)

## 2021-05-16 LAB — URINALYSIS, ROUTINE W REFLEX MICROSCOPIC
Bilirubin Urine: NEGATIVE
Glucose, UA: NEGATIVE
Hgb urine dipstick: NEGATIVE
Ketones, ur: NEGATIVE
Leukocytes,Ua: NEGATIVE
Nitrite: NEGATIVE
Protein, ur: NEGATIVE
Specific Gravity, Urine: 1.018 (ref 1.001–1.035)
pH: 6 (ref 5.0–8.0)

## 2021-05-16 LAB — PSA: PSA: 4.57 ng/mL — ABNORMAL HIGH (ref <=4.00)

## 2021-05-16 LAB — TSH: TSH: 1.39 mIU/L (ref 0.40–4.50)

## 2021-05-16 LAB — MICROALBUMIN / CREATININE URINE RATIO
Creatinine, Urine: 109 mg/dL (ref 20–320)
Microalb Creat Ratio: 25 mcg/mg creat (ref <30)
Microalb, Ur: 2.7 mg/dL

## 2021-05-16 LAB — MAGNESIUM: Magnesium: 2.2 mg/dL (ref 1.5–2.5)

## 2021-05-16 LAB — LIPID PANEL
Cholesterol: 144 mg/dL (ref <200)
HDL: 50 mg/dL (ref >=40)
LDL Cholesterol (Calc): 71 mg/dL (calc)
Non-HDL Cholesterol (Calc): 94 mg/dL (calc) (ref <130)
Total CHOL/HDL Ratio: 2.9 (calc) (ref <5.0)
Triglycerides: 155 mg/dL — ABNORMAL HIGH (ref <150)

## 2021-05-16 LAB — HEMOGLOBIN A1C
Hgb A1c MFr Bld: 5.6 % of total Hgb (ref <5.7)
Mean Plasma Glucose: 114 mg/dL
eAG (mmol/L): 6.3 mmol/L

## 2021-05-16 LAB — INSULIN, RANDOM: Insulin: 11.5 u[IU]/mL

## 2021-05-16 LAB — VITAMIN D 25 HYDROXY (VIT D DEFICIENCY, FRACTURES): Vit D, 25-Hydroxy: 41 ng/mL (ref 30–100)

## 2021-05-16 LAB — TESTOSTERONE: Testosterone: 285 ng/dL (ref 250–827)

## 2021-05-16 NOTE — Progress Notes (Signed)
=============================================================== °-   Test results slightly outside the reference range are not unusual. If there is anything important, I will review this with you,  otherwise it is considered normal test values.  If you have further questions,  please do not hesitate to contact me at the office or via My Chart.  =============================================================== ===============================================================  -  PSA is up slightly from last year,  So need to repeat a special PSA at                                                                        May visit called  "Free & Total PSA "  =============================================================== ===============================================================  -  Total Chol = 144    &   LDL  Chol = 71  -   both    Excellent   - Very low risk for Heart Attack  / Stroke ============================================================ ============================================================  -  Testosterone - Normal - Continue Gel - Same  =============================================================== ===============================================================  -  A1c = 5.6%   and back in Normal non-Diabetic Range -  Great ! =============================================================== ===============================================================  -  Vitamin D = 42 - Very Low !  - Vitamin D goal is between 70-100.  cxcxcxcxcxcxcxcxcxcxcxccxcxcxcxcxcxcxcxcxcxccxcxcxcxcxcxcxcxccxxcxcxcxcxc   - Please INCREASE your Vitamin D 5,000 unit caps up                                                                                 to 2 capsules = 10,000 units /day   cxcxcxcxcxcxcxcxcxcxcxccxcxcxcxcxcxcxcxcxcxccxcxcxcxcxcxcxcxccxxcxcxcxcxc  - It is very important as a natural anti-inflammatory and helping the  immune system protect against viral infections, like the  Covid-19    helping hair, skin, and nails, as well as reducing stroke and  heart attack risk.   - It helps your bones and helps with mood.  - It also decreases numerous cancer risks so please  take it as directed.   - Low Vit D is associated with a 200-300% higher risk for  CANCER   and 200-300% higher risk for HEART   ATTACK  &  STROKE.    - It is also associated with higher death rate at younger ages,   autoimmune diseases like Rheumatoid arthritis, Lupus,  Multiple Sclerosis.     - Also many other serious conditions, like depression, Alzheimer's  Dementia, infertility, muscle aches, fatigue, fibromyalgia   - just to name a few. =============================================================== ===============================================================  -  All Else - CBC - Kidneys - Electrolytes - Liver - Magnesium & Thyroid    - all  Normal / OK  =============================================================== ===============================================================

## 2021-05-18 ENCOUNTER — Encounter: Payer: Self-pay | Admitting: Internal Medicine

## 2021-05-19 LAB — POC COVID19 BINAXNOW: SARS Coronavirus 2 Ag: NEGATIVE

## 2021-05-26 NOTE — Progress Notes (Signed)
Assessment and Plan:  Gerald [name] was seen today for acute visit.  Diagnoses and all orders for this visit:  Essential hypertension - continue medications, DASH diet, exercise and monitor at home. Call if greater than 130/80.    Bronchitis Continue use of Promethazine DM and Tessalon perles as needed Push fluids Mucinex as needed If no improvement in the next 5 days or worsens please notify the office -     DG Chest 2 View; Future -     azithromycin (ZITHROMAX) 250 MG tablet; Take 2 tablets (500 mg) on  Day 1,  followed by 1 tablet (250 mg) once daily on Days 2 through 5. -     predniSONE (DELTASONE) 20 MG tablet; 3 tablets daily with food for 3 days, 2 tabs daily for 3 days, 1 tab a day for 5 days. -     albuterol (VENTOLIN HFA) 108 (90 Base) MCG/ACT inhaler; Inhale 2 puffs into the lungs every 6 (six) hours as needed for wheezing or shortness of breath. -     PR INHAL RX, AIRWAY OBST/DX SPUTUM INDUCT       Further disposition pending results of labs. Discussed med's effects and SE's.   Over 30 minutes of exam, counseling, chart review, and critical decision making was performed.   Future Appointments  Date Time Provider [place]  08/22/2021 10:30 AM Magda Bernheim, NP [facility] None  11/24/2021 10:30 AM Unk [name], MD [facility] None  05/26/2022 10:00 AM Unk [name], MD [facility] None    ------------------------------------------------------------------------------------------------------------------   HPI BP 140/82    Pulse 70    Temp (!) 97.5 F (36.4 C)    Wt 194 lb (88 kg)    SpO2 96%    BMI 29.50 kg/m   66 y.o.male presents for productive cough of green mucus and associated with congestion.  Denies headaches, fever, nausea, vomiting and diarrhea.  Took multiple home Covid tests which were negative.  Symptoms have been present for 2 weeks. Mucinex has not relieved symptoms. Also using Promethazine DM cough syrup and Tessalon perles with minimal relief.    Blood pressure is currently controlled with moexipril 15 mg daily.  Denies chest pain, shortness of breath, dizziness and headaches BP Readings from Last 3 Encounters:  05/27/21 140/82  05/15/21 136/66  02/20/21 (!) 165/99     Past Medical History:  Diagnosis Date   Acute medial meniscus tear of right knee 02/07/2021   Headache    Hyperlipidemia    Hypertension    Pre-diabetes    no meds   Vitamin D deficiency      No Known Allergies  Current Outpatient Medications on File Prior to Visit  Medication Sig   ALPRAZolam (XANAX) 0.5 MG tablet TAKE 1/2-1 TAB 2-3X DAILY IF NEEDED FOR ANXIETY ATTACK (MAX 5 DAYS/WEEK TO AVOID ADDICTION/DEMENTIA)   Ascorbic Acid (VITAMIN C) 1000 MG tablet Take 1,000 mg by mouth 2 (two) times daily.   aspirin EC 81 MG tablet Take 81 mg by mouth daily. Swallow whole.   benzonatate (TESSALON) 200 MG capsule Take 1 perle 3 x / day to prevent cough   butalbital-acetaminophen-caffeine (FIORICET) 50-325-40 MG tablet TAKE 1 TABLET EVERY 4 TO 6 HOURS ONLY IF NEEDED FOR SEVERE HEADACHE   Cholecalciferol (VITAMIN D-3) 5000 UNITS TABS Take 5,000 Units by mouth daily.    cyclobenzaprine (FLEXERIL) 10 MG tablet Take 10 mg by mouth as needed for muscle spasms.   dexamethasone (DECADRON) 4 MG tablet Take 1 tab 3 x day -  3 days, then 2 x day - 3 days, then 1 tab daily   Flaxseed, Linseed, (FLAX SEED OIL PO) Take 1,000 mg by mouth 3 (three) times daily.    Glucosamine-Chondroitin (GLUCOSAMINE CHONDR COMPLEX PO) Take by mouth. Take 2 tablets daily   HYDROcodone-acetaminophen (NORCO/VICODIN) 5-325 MG tablet Take 1 tablet by mouth every 4 (four) hours as needed for moderate pain.   ibuprofen (ADVIL) 600 MG tablet Take 1 tablet (600 mg total) by mouth every 6 (six) hours as needed.   Magnesium 500 MG TABS Take 1 tablet (500 mg total) by mouth daily.   Melatonin 10 MG CAPS Take by mouth Nightly.   moexipril (UNIVASC) 15 MG tablet TAKE 1 TABLET (15 MG TOTAL) BY MOUTH DAILY.  FOR BLOOD PRESSURE   Omega-3 Fatty Acids (FISH OIL PO) Take 1,000 mg by mouth 3 (three) times daily.    pregabalin (LYRICA) 200 MG capsule Take 1 capsule 2 x /day   Probiotic Product (ALIGN) 4 MG CAPS Take 1 capsule daily for Probiotic Benefit   promethazine-dextromethorphan (PROMETHAZINE-DM) 6.25-15 MG/5ML syrup Take 1 tsp every 4 hours if needed for cough   pseudoephedrine (SUDAFED) 120 MG 12 hr tablet Take  1 tablet  2 x /day (every 12 hours)  for Head and Chest Congestion   rosuvastatin (CRESTOR) 20 MG tablet Take  1 tablet   Daily  for Cholesterol   SUMAtriptan Succinate (ONZETRA XSAIL NA) Place 1 application into the nose See admin instructions. Once as needed with onset of migraine, up to twice per episode, 2 hours apart.   tadalafil (CIALIS) 5 MG tablet TAKE 1 TABLET DAILY FOR    PROSTATISM   TAZORAC 0.05 % cream as needed.    Testosterone 10 MG/ACT (2%) GEL APPLY THE CONTENTS OF 4    PUMP ACTUATIONS DAILY TO   THIGHS   vitamin B-12 (CYANOCOBALAMIN) 100 MCG tablet Take 100 mcg by mouth daily.   zolmitriptan (ZOMIG) 5 MG tablet 1 tab with onset of migraine, repeat in 2 hours if needed. Max 10 mg in 24 hours   No current facility-administered medications on file prior to visit.    ROS: all negative except above.   Physical Exam:  BP 140/82    Pulse 70    Temp (!) 97.5 F (36.4 C)    Wt 194 lb (88 kg)    SpO2 96%    BMI 29.50 kg/m   General Appearance: Well nourished, in no apparent distress. Eyes: PERRLA, EOMs, conjunctiva no swelling or erythema Sinuses: No Frontal/maxillary tenderness ENT/Mouth: Ext aud canals clear, TMs dull without erythema, bulging. No erythema, swelling, or exudate on post pharynx.  Tonsils not swollen or erythematous. Hearing normal.  Neck: Supple, thyroid normal.  Respiratory: Respiratory effort normal, Lung sounds diminished at bases bilaterally, expiratory wheezes right lower lobe Cardio: RRR with no MRGs. Brisk peripheral pulses without edema.   Abdomen: Soft, + BS.  Non tender, no guarding, rebound, hernias, masses. Lymphatics: Non tender without lymphadenopathy.  Musculoskeletal: Full ROM, 5/5 strength, normal gait.  Skin: Warm, dry without rashes, lesions, ecchymosis.  Neuro: Cranial nerves intact. Normal muscle tone, no cerebellar symptoms. Sensation intact.  Psych: Awake and oriented X 3, normal affect, Insight and Judgment appropriate.  Nebulizer treatment with duoneb- lung sounds have improved, more airway exchange heard in lower lobes bilaterally  Gerald [name] Kathyrn Drown, NP 3:54 PM [facility]

## 2021-05-27 ENCOUNTER — Encounter: Payer: Self-pay | Admitting: Nurse Practitioner

## 2021-05-27 ENCOUNTER — Other Ambulatory Visit: Payer: Self-pay

## 2021-05-27 ENCOUNTER — Ambulatory Visit (INDEPENDENT_AMBULATORY_CARE_PROVIDER_SITE_OTHER): Payer: Medicare HMO | Admitting: Nurse Practitioner

## 2021-05-27 VITALS — BP 140/82 | HR 70 | Temp 97.5°F | Wt 194.0 lb

## 2021-05-27 DIAGNOSIS — J40 Bronchitis, not specified as acute or chronic: Secondary | ICD-10-CM

## 2021-05-27 DIAGNOSIS — I10 Essential (primary) hypertension: Secondary | ICD-10-CM | POA: Diagnosis not present

## 2021-05-27 MED ORDER — AZITHROMYCIN 250 MG PO TABS: ORAL_TABLET | ORAL | 1 refills | Status: DC

## 2021-05-27 MED ORDER — ALBUTEROL SULFATE HFA 108 (90 BASE) MCG/ACT IN AERS: 2.0000 | INHALATION_SPRAY | Freq: Four times a day (QID) | RESPIRATORY_TRACT | 2 refills | Status: DC | PRN

## 2021-05-27 MED ORDER — PREDNISONE 20 MG PO TABS: ORAL_TABLET | ORAL | 0 refills | Status: AC

## 2021-05-27 NOTE — Patient Instructions (Signed)
Chest xray ordered at [facility]- can walk in to get xray performed  Acute Bronchitis, Adult Acute bronchitis is sudden inflammation of the main airways (bronchi) that come off the windpipe (trachea) in the lungs. The swelling causes the airways to get smaller and make more mucus than normal. This can make it hard to breathe and can cause coughing or noisy breathing (wheezing). Acute bronchitis may last several weeks. The cough may last longer. Allergies, asthma, and exposure to smoke may make the condition worse. What are the causes? This condition can be caused by germs and by substances that irritate the lungs, including: Cold and flu viruses. The most common cause of this condition is the virus that causes the common cold. Bacteria. This is less common. Breathing in substances that irritate the lungs, including: Smoke from cigarettes and other forms of tobacco. Dust and pollen. Fumes from household cleaning products, gases, or burned fuel. Indoor or outdoor air pollution. What increases the risk? The following factors may make you more likely to develop this condition: A weak body's defense system, also called the immune system. A condition that affects your lungs and breathing, such as asthma. What are the signs or symptoms? Common symptoms of this condition include: Coughing. This may bring up clear, yellow, or green mucus from your lungs (sputum). Wheezing. Runny or stuffy nose. Having too much mucus in your lungs (chest congestion). Shortness of breath. Aches and pains, including sore throat or chest. How is this diagnosed? This condition is usually diagnosed based on: Your symptoms and medical history. A physical exam. You may also have other tests, including tests to rule out other conditions, such as pneumonia. These tests include: A test of lung function. Test of a mucus sample to look for the presence of bacteria. Tests to check the oxygen level in your  blood. Blood tests. Chest X-ray. How is this treated? Most cases of acute bronchitis clear up over time without treatment. Your health care provider may recommend: Drinking more fluids to help thin your mucus so it is easier to cough up. Taking inhaled medicine (inhaler) to improve air flow in and out of your lungs. Using a vaporizer or a humidifier. These are machines that add water to the air to help you breathe better. Taking a medicine that thins mucus and clears congestion (expectorant). Taking a medicine that prevents or stops coughing (cough suppressant). It is notcommon to take an antibiotic medicine for this condition. Follow these instructions at home:  Take over-the-counter and prescription medicines only as told by your health care provider. Use an inhaler, vaporizer, or humidifier as told by your health care provider. Take two teaspoons (10 mL) of honey at bedtime to lessen coughing at night. Drink enough fluid to keep your urine pale yellow. Do not use any products that contain nicotine or tobacco. These products include cigarettes, chewing tobacco, and vaping devices, such as e-cigarettes. If you need help quitting, ask your health care provider. Get plenty of rest. Return to your normal activities as told by your health care provider. Ask your health care provider what activities are safe for you. Keep all follow-up visits. This is important. How is this prevented? To lower your risk of getting this condition again: Wash your hands often with soap and water for at least 20 seconds. If soap and water are not available, use hand sanitizer. Avoid contact with people who have cold symptoms. Try not to touch your mouth, nose, or eyes with your hands. Avoid breathing  in smoke or chemical fumes. Breathing smoke or chemical fumes will make your condition worse. Get the flu shot every year. Contact a health care provider if: Your symptoms do not improve after 2 weeks. You have  trouble coughing up the mucus. Your cough keeps you awake at night. You have a fever. Get help right away if you: Cough up blood. Feel pain in your chest. Have severe shortness of breath. Faint or keep feeling like you are going to faint. Have a severe headache. Have a fever or chills that get worse. These symptoms may represent a serious problem that is an emergency. Do not wait to see if the symptoms will go away. Get medical help right away. Call your local emergency services (911 in the [place] Do not drive yourself to the hospital. Summary Acute bronchitis is inflammation of the main airways (bronchi) that come off the windpipe (trachea) in the lungs. The swelling causes the airways to get smaller and make more mucus than normal. Drinking more fluids can help thin your mucus so it is easier to cough up. Take over-the-counter and prescription medicines only as told by your health care provider. Do not use any products that contain nicotine or tobacco. These products include cigarettes, chewing tobacco, and vaping devices, such as e-cigarettes. If you need help quitting, ask your health care provider. Contact a health care provider if your symptoms do not improve after 2 weeks. This information is not intended to replace advice given to you by your health care provider. Make sure you discuss any questions you have with your health care provider. Document Revised: 07/24/2020 Document Reviewed: 07/24/2020 Elsevier Patient Education  [employer].

## 2021-05-28 ENCOUNTER — Ambulatory Visit
Admission: RE | Admit: 2021-05-28 | Discharge: 2021-05-28 | Disposition: A | Discharge status: Home / Self Care | Payer: Medicare HMO | Attending: Nurse Practitioner | Admitting: Nurse Practitioner

## 2021-05-28 ENCOUNTER — Ambulatory Visit
Admission: RE | Admit: 2021-05-28 | Discharge: 2021-05-28 | Disposition: A | Discharge status: Home / Self Care | Payer: Medicare HMO | Source: Ambulatory Visit | Attending: Nurse Practitioner | Admitting: Nurse Practitioner

## 2021-05-28 DIAGNOSIS — R059 Cough, unspecified: Secondary | ICD-10-CM | POA: Diagnosis not present

## 2021-05-28 DIAGNOSIS — J40 Bronchitis, not specified as acute or chronic: Secondary | ICD-10-CM | POA: Insufficient documentation

## 2021-06-02 ENCOUNTER — Other Ambulatory Visit: Payer: Self-pay

## 2021-06-02 DIAGNOSIS — Z1212 Encounter for screening for malignant neoplasm of rectum: Secondary | ICD-10-CM

## 2021-06-02 DIAGNOSIS — Z1211 Encounter for screening for malignant neoplasm of colon: Secondary | ICD-10-CM

## 2021-06-02 LAB — POC HEMOCCULT BLD/STL (HOME/3-CARD/SCREEN)
Card #2 Fecal Occult Blod, POC: NEGATIVE
Card #3 Fecal Occult Blood, POC: NEGATIVE
Fecal Occult Blood, POC: NEGATIVE

## 2021-06-03 DIAGNOSIS — Z1212 Encounter for screening for malignant neoplasm of rectum: Secondary | ICD-10-CM | POA: Diagnosis not present

## 2021-06-03 DIAGNOSIS — Z1211 Encounter for screening for malignant neoplasm of colon: Secondary | ICD-10-CM | POA: Diagnosis not present

## 2021-06-16 ENCOUNTER — Other Ambulatory Visit: Payer: Self-pay | Admitting: Adult Health

## 2021-07-09 DIAGNOSIS — Z8249 Family history of ischemic heart disease and other diseases of the circulatory system: Secondary | ICD-10-CM | POA: Diagnosis not present

## 2021-07-09 DIAGNOSIS — Z008 Encounter for other general examination: Secondary | ICD-10-CM | POA: Diagnosis not present

## 2021-07-09 DIAGNOSIS — E785 Hyperlipidemia, unspecified: Secondary | ICD-10-CM | POA: Diagnosis not present

## 2021-07-09 DIAGNOSIS — Z820 Family history of epilepsy and other diseases of the nervous system: Secondary | ICD-10-CM | POA: Diagnosis not present

## 2021-07-09 DIAGNOSIS — E291 Testicular hypofunction: Secondary | ICD-10-CM | POA: Diagnosis not present

## 2021-07-09 DIAGNOSIS — N529 Male erectile dysfunction, unspecified: Secondary | ICD-10-CM | POA: Diagnosis not present

## 2021-07-09 DIAGNOSIS — G43909 Migraine, unspecified, not intractable, without status migrainosus: Secondary | ICD-10-CM | POA: Diagnosis not present

## 2021-07-09 DIAGNOSIS — Z7982 Long term (current) use of aspirin: Secondary | ICD-10-CM | POA: Diagnosis not present

## 2021-07-09 DIAGNOSIS — Z6831 Body mass index (BMI) 31.0-31.9, adult: Secondary | ICD-10-CM | POA: Diagnosis not present

## 2021-07-09 DIAGNOSIS — M1990 Unspecified osteoarthritis, unspecified site: Secondary | ICD-10-CM | POA: Diagnosis not present

## 2021-07-09 DIAGNOSIS — G4700 Insomnia, unspecified: Secondary | ICD-10-CM | POA: Diagnosis not present

## 2021-07-09 DIAGNOSIS — E669 Obesity, unspecified: Secondary | ICD-10-CM | POA: Diagnosis not present

## 2021-07-09 DIAGNOSIS — I10 Essential (primary) hypertension: Secondary | ICD-10-CM | POA: Diagnosis not present

## 2021-08-15 ENCOUNTER — Other Ambulatory Visit: Payer: Self-pay | Admitting: Nurse Practitioner

## 2021-08-20 NOTE — Progress Notes (Signed)
WELCOME TO MEDICARE AND FOLLOW UP 3 MONTH  Assessment and Plan:   Encounter for Welcome to Medicare Due Yearly  Hypertension Atypically elevated today; has been well controlled when checked sporadiacally Reduce stress, salt, animal protein; keep daily log rotating times at home and return for recheck NV in 2-4 weeks Monitor blood pressure at home; patient to call if consistently greater than 130/80 Continue DASH diet.   Reminder to go to the ER if any CP, SOB, nausea, dizziness, severe HA, changes vision/speech, left arm numbness and tingling and jaw pain. - CBC  Cholesterol Currently at goal; continue statin Continue low cholesterol diet and exercise.  Check lipid panel.  - CMP Hx of Prediabetes/ abnormal glucose Discussed diet/exercise, weight management  Check A1C q62m check CMP  Overweight BMI 29 Long discussion about weight loss, diet, and exercise Recommended diet heavy in fruits and veggies and low in animal meats, cheeses, and dairy products, appropriate calorie intake Patient will work on increasing exercise; Will follow up in 3 months  Vitamin D Def At goal at last visit; continue supplementation to maintain goal of 60-100 Defer Vit D level   Testosterone deficency - continue to monitor, states medication is helping with symptoms of low T.  Androgel 3 pumps a day.  Medication Management CBC, CMP/GFR, TSH  Right arm laceration Keep area clean and dry Notify the office if redness increases or drainage appears - TD  Need for pneumococcal vaccine - Prevnar 20 given   Further disposition pending results if labs check today. Discussed med's effects and SE's.   Over 30 minutes of face to face interview, exam, counseling, chart review, and critical decision making was performed.    Future Appointments  Date Time Provider DOld [place] 11/24/2021 10:30 AM MUnk [name] MD [facility] None  05/26/2022 10:00 AM MUnk [name] MD [facility] None   08/24/2022 11:00 AM MMagda Bernheim NP [facility] None    ----------------------------------------------------------------------------------------------------------------------  HPI 66y.o. male  presents for 3 month follow up on hypertension, HTN, HLD, glucose management, weight and vitamin D deficiency.   He has a scratch on right antecubital. No s/s of infections.   He has history of migraines; doing well, was followed by Dr. MOrie Routat [facility] but no longer in network; reports migraines in clusters/spurts, variable. Will have several then none for 3-4 months. Takes lyrica 200 mg BID as prophylactic (reduced from 25/month to current 5-8). Will take ibuprofen for mild which does work 75% of the time. Takes zomig up to twice per week and supplements which fioricet. Also has onzetra for severe that works very well but expensive with insurance. Nurtec didn't help. Has been on current regimen for 4-5 years, no changes and works fairly well, requests we take over prescribing meds for now.   He is prescribed xanax, takes 0.25 mg PRN at night for sleep, typically takes only 3-4 times per month, increased with stress. Has some stress r/t 835year old mother, does have in home help. Has also had a close friend die suddenly. He also has a sister in law with dementia and they are caregivers.  BMI is Body mass index is 30.01 kg/m., he has been working on diet and exercise, [facility], also works out in the yard frequently. He monitors weights closely at home. Follows good diet 80+% of the time.  Wt Readings from Last 3 Encounters:  08/22/21 197 lb 6.4 oz (89.5 kg)  05/27/21 194  lb (88 kg)  05/15/21 198 lb 3.2 oz (89.9 kg)   HTN predates 1999.  Negative heart cath in 2008. His blood pressure has been controlled at home (120-140s/70-80s),He is currently on Moexipril 15 mg daily.  today their BP is BP: 126/78  BP Readings from Last 3 Encounters:  08/22/21 126/78   05/27/21 140/82  05/15/21 136/66   reports more stress recently friend suddenly dies, taking care of mom  He does workout. He denies chest pain, shortness of breath, dizziness.   He is on cholesterol medication (rosuvastatin 20 mg daily) and denies myalgias. His cholesterol is at goal. The cholesterol last visit was:   Lab Results  Component Value Date   CHOL 144 05/15/2021   HDL 50 05/15/2021   LDLCALC 71 05/15/2021   TRIG 155 (H) 05/15/2021   CHOLHDL 2.9 05/15/2021    He has been working on diet and exercise for hx of intermittent prediabetes (A1c 5.7% / 2011), and denies increased appetite, nausea, paresthesia of the feet, polydipsia, polyuria, visual disturbances and vomiting. Last A1C in the office was:  Lab Results  Component Value Date   HGBA1C 5.6 05/15/2021    Patient is on Vitamin D supplement for deficiency and at goal last check:   Lab Results  Component Value Date   VD25OH 38 05/15/2021     He has a history of testosterone deficiency (2005) and is on testosterone replacement (gel). He states that the testosterone helps with his energy, libido, muscle mass. Lab Results  Component Value Date   TESTOSTERONE 285 05/15/2021    Current Outpatient Medications on File Prior to Visit  Medication Sig Dispense Refill   albuterol (VENTOLIN HFA) 108 (90 Base) MCG/ACT inhaler Inhale 2 puffs into the lungs every 6 (six) hours as needed for wheezing or shortness of breath. 8 g 2   ALPRAZolam (XANAX) 0.5 MG tablet TAKE 1/2-1 TAB 2-3X DAILY IF NEEDED FOR ANXIETY ATTACK (MAX 5 DAYS/WEEK TO AVOID ADDICTION/DEMENTIA) 90 tablet 0   Ascorbic Acid (VITAMIN C) 1000 MG tablet Take 1,000 mg by mouth 2 (two) times daily.     aspirin EC 81 MG tablet Take 81 mg by mouth daily. Swallow whole.     butalbital-acetaminophen-caffeine (FIORICET) 50-325-40 MG tablet TAKE 1 TABLET EVERY 4 TO 6 HOURS ONLY IF NEEDED FOR SEVERE HEADACHE 30 tablet 0   Cholecalciferol (VITAMIN D-3) 5000 UNITS TABS Take  5,000 Units by mouth daily.      cyclobenzaprine (FLEXERIL) 10 MG tablet Take 10 mg by mouth as needed for muscle spasms.     Flaxseed, Linseed, (FLAX SEED OIL PO) Take 1,000 mg by mouth 3 (three) times daily.      Glucosamine-Chondroitin (GLUCOSAMINE CHONDR COMPLEX PO) Take by mouth. Take 2 tablets daily     guaiFENesin (MUCINEX PO) Take by mouth.     ibuprofen (ADVIL) 600 MG tablet Take 1 tablet (600 mg total) by mouth every 6 (six) hours as needed. 30 tablet 0   Magnesium 500 MG TABS Take 1 tablet (500 mg total) by mouth daily. 30 tablet 0   Melatonin 10 MG CAPS Take by mouth Nightly.     moexipril (UNIVASC) 15 MG tablet TAKE 1 TABLET (15 MG TOTAL) BY MOUTH DAILY. FOR BLOOD PRESSURE 90 tablet 1   Omega-3 Fatty Acids (FISH OIL PO) Take 1,000 mg by mouth 3 (three) times daily.      pregabalin (LYRICA) 200 MG capsule Take 1 capsule 2 x /day 180 capsule 1   Probiotic  Product (ALIGN) 4 MG CAPS Take 1 capsule daily for Probiotic Benefit 90 capsule 1   rosuvastatin (CRESTOR) 20 MG tablet Take  1 tablet   Daily  for Cholesterol 90 tablet 3   tadalafil (CIALIS) 5 MG tablet TAKE 1 TABLET DAILY FOR    PROSTATISM 90 tablet 3   Testosterone 10 MG/ACT (2%) GEL APPLY THE CONTENTS OF 4    PUMP ACTUATIONS DAILY TO   THIGHS 180 g 1   vitamin B-12 (CYANOCOBALAMIN) 100 MCG tablet Take 100 mcg by mouth daily.     zolmitriptan (ZOMIG) 5 MG tablet 1 tab with onset of migraine, repeat in 2 hours if needed. Max 10 mg in 24 hours 24 tablet 2   promethazine-dextromethorphan (PROMETHAZINE-DM) 6.25-15 MG/5ML syrup Take 1 tsp every 4 hours if needed for cough (Patient not taking: Reported on 08/22/2021) 240 mL 1   SUMAtriptan Succinate (ONZETRA XSAIL NA) Place 1 application into the nose See admin instructions. Once as needed with onset of migraine, up to twice per episode, 2 hours apart.     TAZORAC 0.05 % cream as needed.  (Patient not taking: Reported on 08/22/2021)     No current facility-administered medications on  file prior to visit.   Immunization History  Administered Date(s) Administered   Influenza Inj Mdck Quad Pf 01/04/2019   Influenza Inj Mdck Quad With Preservative 12/27/2017   Influenza Split 01/08/2014   Influenza Whole 01/05/2013   Influenza,inj,Quad PF,6+ Mos 01/15/2021   Influenza-Unspecified 01/05/2015   PFIZER(Purple Top)SARS-COV-2 Vaccination 06/05/2019, 06/27/2019, 03/09/2020   PPD Test 01/08/2014, 01/14/2015, 12/12/2015, 01/12/2017, 02/08/2018, 04/27/2019, 05/08/2020   Pfizer Covid-19 Vaccine Bivalent Booster 15yr & up 03/06/2021   Tdap 06/15/2008   Health Maintenance  Topic Date Due   TETANUS/TDAP  06/16/2018   Pneumonia Vaccine 65 Years old (1 - PCV) Never done   Zoster Vaccines- Shingrix (1 of 2) 11/22/2021 (Originally [birth year]11/1974)   Hepatitis C Screening  08/23/2022 (Originally [birth year]10/1973)   HIV Screening  08/23/2022 (Originally 05/11/1970)   INFLUENZA VACCINE  11/04/2021   COLONOSCOPY (Pts 45-447yrInsurance coverage will need to be confirmed)  08/01/2024   COVID-19 Vaccine  Completed   HPV VACCINES  Aged Out   Eye Doctor: Dr. GoDelman Cheadleast visit 01/2021 Dentist: Dr. MoRandol Kernast visit 12/2020   Allergies: No Known Allergies   Medical History:  Past Medical History:  Diagnosis Date   Acute medial meniscus tear of right knee 02/07/2021   Headache    Hyperlipidemia    Hypertension    Pre-diabetes    no meds   Vitamin D deficiency    Family history- Reviewed and unchanged Social history- Reviewed and unchanged   Review of Systems:  Review of Systems  Constitutional:  Negative for chills, fever, malaise/fatigue and weight loss.  HENT:  Negative for congestion, hearing loss, sinus pain, sore throat and tinnitus.   Eyes:  Negative for blurred vision and double vision.  Respiratory:  Negative for cough, sputum production, shortness of breath and wheezing.   Cardiovascular:  Negative for chest pain, palpitations, orthopnea, claudication and leg swelling.   Gastrointestinal:  Negative for abdominal pain, blood in stool, constipation, diarrhea, heartburn, melena, nausea and vomiting.  Genitourinary: Negative.  Negative for dysuria and urgency.  Musculoskeletal:  Negative for back pain, falls, joint pain and myalgias.  Skin:  Negative for rash.  Neurological:  Positive for headaches (improved, stable). Negative for dizziness, tingling, sensory change and weakness.  Endo/Heme/Allergies:  Negative for environmental allergies and polydipsia.  Psychiatric/Behavioral:  Negative for depression and suicidal ideas. The patient is not nervous/anxious and does not have insomnia (occasional when stressed).   All other systems reviewed and are negative.   Physical Exam: BP 126/78   Pulse 60   Temp 97.9 F (36.6 C)   Resp 16   Ht '5\' 8"'$  (1.727 m)   Wt 197 lb 6.4 oz (89.5 kg)   SpO2 97%   BMI 30.01 kg/m  Wt Readings from Last 3 Encounters:  08/22/21 197 lb 6.4 oz (89.5 kg)  05/27/21 194 lb (88 kg)  05/15/21 198 lb 3.2 oz (89.9 kg)   General Appearance: Well nourished, in no apparent distress. Eyes: PERRLA, EOMs, conjunctiva no swelling or erythema Sinuses: No Frontal/maxillary tenderness ENT/Mouth: Ext aud canals clear, TMs without erythema, bulging. No erythema, swelling, or exudate on post pharynx.  Tonsils not swollen or erythematous. Hearing normal.  Neck: Supple, thyroid normal.  Respiratory: Respiratory effort normal, BS equal bilaterally without rales, rhonchi, wheezing or stridor.  Cardio: RRR with no MRGs. Brisk peripheral pulses without edema.  Abdomen: Soft, + BS.  Non tender, no guarding, rebound, hernias, masses. Lymphatics: Non tender without lymphadenopathy.  Musculoskeletal: Full ROM, 5/5 strength, Normal gait.  Skin: Warm, dry . Small 1 cm laceration of right lower arm, small redness, no s/s of infection Neuro: Cranial nerves intact. No cerebellar symptoms.  Psych: Awake and oriented X 3, normal affect, Insight and Judgment  appropriate.  EKG: deferred done 05/2021 AAA:  Deferred done 05/2021  Magda Bernheim, NP 11:01 AM [facility]

## 2021-08-22 ENCOUNTER — Ambulatory Visit (INDEPENDENT_AMBULATORY_CARE_PROVIDER_SITE_OTHER): Payer: Medicare HMO | Admitting: Nurse Practitioner

## 2021-08-22 ENCOUNTER — Encounter: Payer: Self-pay | Admitting: Nurse Practitioner

## 2021-08-22 VITALS — BP 126/78 | HR 60 | Temp 97.9°F | Resp 16 | Ht 68.0 in | Wt 197.4 lb

## 2021-08-22 DIAGNOSIS — I10 Essential (primary) hypertension: Secondary | ICD-10-CM | POA: Diagnosis not present

## 2021-08-22 DIAGNOSIS — N401 Enlarged prostate with lower urinary tract symptoms: Secondary | ICD-10-CM

## 2021-08-22 DIAGNOSIS — E559 Vitamin D deficiency, unspecified: Secondary | ICD-10-CM

## 2021-08-22 DIAGNOSIS — Z0000 Encounter for general adult medical examination without abnormal findings: Secondary | ICD-10-CM

## 2021-08-22 DIAGNOSIS — S41111A Laceration without foreign body of right upper arm, initial encounter: Secondary | ICD-10-CM | POA: Diagnosis not present

## 2021-08-22 DIAGNOSIS — G43009 Migraine without aura, not intractable, without status migrainosus: Secondary | ICD-10-CM

## 2021-08-22 DIAGNOSIS — E782 Mixed hyperlipidemia: Secondary | ICD-10-CM | POA: Diagnosis not present

## 2021-08-22 DIAGNOSIS — D508 Other iron deficiency anemias: Secondary | ICD-10-CM

## 2021-08-22 DIAGNOSIS — G4700 Insomnia, unspecified: Secondary | ICD-10-CM

## 2021-08-22 DIAGNOSIS — Z0001 Encounter for general adult medical examination with abnormal findings: Secondary | ICD-10-CM

## 2021-08-22 DIAGNOSIS — E291 Testicular hypofunction: Secondary | ICD-10-CM

## 2021-08-22 DIAGNOSIS — Z23 Encounter for immunization: Secondary | ICD-10-CM

## 2021-08-22 DIAGNOSIS — R6889 Other general symptoms and signs: Secondary | ICD-10-CM | POA: Diagnosis not present

## 2021-08-22 DIAGNOSIS — R7309 Other abnormal glucose: Secondary | ICD-10-CM

## 2021-08-22 DIAGNOSIS — E663 Overweight: Secondary | ICD-10-CM

## 2021-08-22 DIAGNOSIS — Z79899 Other long term (current) drug therapy: Secondary | ICD-10-CM

## 2021-08-23 LAB — COMPLETE METABOLIC PANEL WITH GFR
AG Ratio: 2 (calc) (ref 1.0–2.5)
ALT: 18 U/L (ref 9–46)
AST: 23 U/L (ref 10–35)
Albumin: 4.5 g/dL (ref 3.6–5.1)
Alkaline phosphatase (APISO): 98 U/L (ref 35–144)
BUN: 17 mg/dL (ref 7–25)
CO2: 25 mmol/L (ref 20–32)
Calcium: 9.5 mg/dL (ref 8.6–10.3)
Chloride: 105 mmol/L (ref 98–110)
Creat: 0.93 mg/dL (ref 0.70–1.35)
Globulin: 2.2 g/dL (calc) (ref 1.9–3.7)
Glucose, Bld: 83 mg/dL (ref 65–99)
Potassium: 4.5 mmol/L (ref 3.5–5.3)
Sodium: 141 mmol/L (ref 135–146)
Total Bilirubin: 0.4 mg/dL (ref 0.2–1.2)
Total Protein: 6.7 g/dL (ref 6.1–8.1)
eGFR: 91 mL/min/{1.73_m2} (ref >=60)

## 2021-08-23 LAB — CBC WITH DIFFERENTIAL/PLATELET
Absolute Monocytes: 431 cells/uL (ref 200–950)
Basophils Absolute: 62 cells/uL (ref 0–200)
Basophils Relative: 1.5 %
Eosinophils Absolute: 180 cells/uL (ref 15–500)
Eosinophils Relative: 4.4 %
HCT: 43.6 % (ref 38.5–50.0)
Hemoglobin: 14.3 g/dL (ref 13.2–17.1)
Lymphs Abs: 1066 cells/uL (ref 850–3900)
MCH: 27.6 pg (ref 27.0–33.0)
MCHC: 32.8 g/dL (ref 32.0–36.0)
MCV: 84.2 fL (ref 80.0–100.0)
MPV: 9.8 fL (ref 7.5–12.5)
Monocytes Relative: 10.5 %
Neutro Abs: 2362 cells/uL (ref 1500–7800)
Neutrophils Relative %: 57.6 %
Platelets: 276 10*3/uL (ref 140–400)
RBC: 5.18 10*6/uL (ref 4.20–5.80)
RDW: 13.1 % (ref 11.0–15.0)
Total Lymphocyte: 26 %
WBC: 4.1 10*3/uL (ref 3.8–10.8)

## 2021-08-23 LAB — LIPID PANEL
Cholesterol: 137 mg/dL (ref <200)
HDL: 51 mg/dL (ref >=40)
LDL Cholesterol (Calc): 67 mg/dL (calc)
Non-HDL Cholesterol (Calc): 86 mg/dL (calc) (ref <130)
Total CHOL/HDL Ratio: 2.7 (calc) (ref <5.0)
Triglycerides: 102 mg/dL (ref <150)

## 2021-08-23 LAB — TSH: TSH: 1.05 mIU/L (ref 0.40–4.50)

## 2021-10-01 ENCOUNTER — Other Ambulatory Visit: Payer: Self-pay | Admitting: Adult Health

## 2021-10-01 ENCOUNTER — Other Ambulatory Visit: Payer: Self-pay | Admitting: Nurse Practitioner

## 2021-10-01 DIAGNOSIS — G43009 Migraine without aura, not intractable, without status migrainosus: Secondary | ICD-10-CM

## 2021-10-20 ENCOUNTER — Other Ambulatory Visit: Payer: Self-pay | Admitting: Internal Medicine

## 2021-10-20 DIAGNOSIS — E349 Endocrine disorder, unspecified: Secondary | ICD-10-CM

## 2021-10-20 MED ORDER — TESTOSTERONE 10 MG/ACT (2%) TD GEL: TRANSDERMAL | 1 refills | Status: AC

## 2021-10-22 ENCOUNTER — Other Ambulatory Visit: Payer: Self-pay | Admitting: Internal Medicine

## 2021-10-22 DIAGNOSIS — N400 Enlarged prostate without lower urinary tract symptoms: Secondary | ICD-10-CM

## 2021-10-22 MED ORDER — TADALAFIL 5 MG PO TABS: ORAL_TABLET | ORAL | 3 refills | Status: DC

## 2021-10-23 ENCOUNTER — Telehealth: Payer: Self-pay

## 2021-10-23 NOTE — Telephone Encounter (Signed)
Approval for Butalbital-Apap-Caffeine tablets. 05/07/2021-04/05/2022

## 2021-10-28 ENCOUNTER — Other Ambulatory Visit: Payer: Self-pay | Admitting: Internal Medicine

## 2021-10-28 DIAGNOSIS — N400 Enlarged prostate without lower urinary tract symptoms: Secondary | ICD-10-CM

## 2021-10-28 MED ORDER — TADALAFIL 5 MG PO TABS: ORAL_TABLET | ORAL | 3 refills | Status: DC

## 2021-10-30 ENCOUNTER — Other Ambulatory Visit: Payer: Self-pay

## 2021-10-30 ENCOUNTER — Other Ambulatory Visit: Payer: Self-pay | Admitting: Nurse Practitioner

## 2021-10-30 ENCOUNTER — Telehealth: Payer: Self-pay | Admitting: Nurse Practitioner

## 2021-10-30 MED ORDER — CLOBETASOL PROPIONATE 0.05 % EX CREA: TOPICAL_CREAM | CUTANEOUS | 3 refills | Status: AC

## 2021-10-30 MED ORDER — CLOBETASOL PROPIONATE 0.05 % EX CREA: TOPICAL_CREAM | CUTANEOUS | 3 refills | Status: DC

## 2021-10-30 NOTE — Telephone Encounter (Signed)
I have sent the refill to [facility].

## 2021-10-30 NOTE — Telephone Encounter (Signed)
Patient is requesting a refill on Clobetasol Cream 0.05%. He would like it sent to a new pharmacy because it is cheaper. Please sent to Kristopher Oppenheim at [facility].

## 2021-11-04 ENCOUNTER — Other Ambulatory Visit: Payer: Self-pay | Admitting: Nurse Practitioner

## 2021-11-04 ENCOUNTER — Other Ambulatory Visit: Payer: Self-pay | Admitting: Internal Medicine

## 2021-11-04 DIAGNOSIS — G43009 Migraine without aura, not intractable, without status migrainosus: Secondary | ICD-10-CM

## 2021-11-04 MED ORDER — PREGABALIN 200 MG PO CAPS: ORAL_CAPSULE | ORAL | 3 refills | Status: DC

## 2021-11-23 ENCOUNTER — Encounter: Payer: Self-pay | Admitting: Internal Medicine

## 2021-11-23 NOTE — Progress Notes (Unsigned)
Future Appointments  Date Time Provider Department  11/24/2021                   6 mo ov 10:30 AM Unk [name], MD [facility]  05/26/2022                    cpe 10:00 AM Unk [name], MD [facility]  08/24/2022                   wellness 11:00 AM Alycia Rossetti, NP [facility]    History of Present Illness:       This very nice 66 y.o. [name] presents for 6 month follow up with HTN, HLD, Pre-Diabetes and Vitamin D Deficiency.       Patient is treated for HTN since 1999 & BP has been controlled at home. Today's BP is  at goal  - 134/80.  In 2008,  patient had a negative Heart Cath.  He has had no complaints of any cardiac type chest pain, palpitations, dyspnea Vertell Limber /PND, dizziness, claudication or dependent edema.      Hyperlipidemia is controlled with diet & Rosuvastatin. Patient denies myalgias or other med SE's. Last Lipids were at goal :  Lab Results  Component Value Date   CHOL 137 08/22/2021   HDL 51 08/22/2021   LDLCALC 67 08/22/2021   TRIG 102 08/22/2021   CHOLHDL 2.7 08/22/2021    Also, the patient has history of  PreDiabetes (A1c 5.7% /2011)  and has had no symptoms of reactive hypoglycemia, diabetic polys, paresthesias or visual blurring.  Last A1c was Normal & at goal :  Lab Results  Component Value Date   HGBA1C 5.6 05/15/2021                                            Patient has hx/o Low T  since 2005 & has been on replacement with Testosterone gel with improved stamina & sense of well being.          Further, the patient also has history of Vitamin D Deficiency ("48" on Tx in 2008)  and supplements vitamin D without any suspected side-effects. Last vitamin D was not at  goal (70-100) :  Lab Result  Component Value Date   VD25OH 41 05/15/2021        Current Outpatient Medications:    albuterol (VENTOLIN HFA) 108 (90 Base) MCG/ACT inhaler, Inhale 2 puffs into the lungs every 6 (six) hours as needed for wheezing or shortness of breath., Disp: 8  g, Rfl: 2   ALPRAZolam (XANAX) 0.5 MG tablet, TAKE 1/2-1 TAB 2-3X DAILY IF NEEDED FOR ANXIETY ATTACK (MAX 5 DAYS/WEEK TO AVOID ADDICTION/DEMENTIA), Disp: 90 tablet, Rfl: 0   Ascorbic Acid (VITAMIN C) 1000 MG tablet, Take 1,000 mg by mouth 2 (two) times daily., Disp: , Rfl:    aspirin EC 81 MG tablet, Take 81 mg by mouth daily. Swallow whole., Disp: , Rfl:    butalbital-acetaminophen-caffeine (FIORICET) 50-325-40 MG tablet, TAKE 1 TABLET EVERY 4 TO 6 HOURS ONLY IF NEEDED FOR SEVERE HEADACHE, Disp: 30 tablet, Rfl: 0   Cholecalciferol (VITAMIN D-3) 5000 UNITS TABS, Take 5,000 Units by mouth daily. , Disp: , Rfl:    clobetasol cream (TEMOVATE) 0.05 %, APPLY TO RASH TWICE DAILY AS NEEDED, Disp: 60 g, Rfl: 3   cyclobenzaprine (FLEXERIL) 10 MG tablet,  Take 10 mg by mouth as needed for muscle spasms., Disp: , Rfl:    Flaxseed, Linseed, (FLAX SEED OIL PO), Take 1,000 mg by mouth 3 (three) times daily. , Disp: , Rfl:    guaiFENesin (MUCINEX PO), Take by mouth., Disp: , Rfl:    ibuprofen (ADVIL) 600 MG tablet, Take 1 tablet (600 mg total) by mouth every 6 (six) hours as needed., Disp: 30 tablet, Rfl: 0   Magnesium 500 MG TABS, Take 1 tablet (500 mg total) by mouth daily., Disp: 30 tablet, Rfl: 0   Melatonin 10 MG CAPS, Take by mouth Nightly., Disp: , Rfl:    moexipril (UNIVASC) 15 MG tablet, TAKE 1 TABLET (15 MG TOTAL) BY MOUTH DAILY. FOR BLOOD PRESSURE, Disp: 90 tablet, Rfl: 1   Omega-3 Fatty Acids (FISH OIL PO), Take 1,000 mg by mouth 3 (three) times daily. , Disp: , Rfl:    pregabalin (LYRICA) 200 MG capsule, Take 1 capsule 2 x /day, Disp: 180 capsule, Rfl: 3   Probiotic Product (ALIGN) 4 MG CAPS, Take 1 capsule daily for Probiotic Benefit, Disp: 90 capsule, Rfl: 1   rosuvastatin (CRESTOR) 20 MG tablet, Take  1 tablet   Daily  for Cholesterol, Disp: 90 tablet, Rfl: 3   SUMAtriptan Succinate (ONZETRA XSAIL NA), Place 1 application into the nose See admin instructions. Once as needed with onset of migraine,  up to twice per episode, 2 hours apart., Disp: , Rfl:    tadalafil (CIALIS) 5 MG tablet, Take  1 tablet  Daily  for Prostatism, Disp: 90 tablet, Rfl: 3   Testosterone 10 MG/ACT (2%) GEL, APPLY THE CONTENTS OF 4    PUMP ACTUATIONS DAILY TO   THIGHS, Disp: 360 g, Rfl: 1   vitamin B-12 (CYANOCOBALAMIN) 100 MCG tablet, Take 100 mcg by mouth daily., Disp: , Rfl:    zolmitriptan (ZOMIG) 5 MG tablet, TAKE ONE TABLET BY MOUTH AT ONSET OF HEADACHE; MAY REPEAT ONE TABLET IN 2 HOURS IF NEEDED., Disp: 24 tablet, Rfl: 2   PMHx:   Past Medical History:  Diagnosis Date   Acute medial meniscus tear of right knee 02/07/2021   Headache    Hyperlipidemia    Hypertension    Pre-diabetes    no meds   Vitamin D deficiency     Immunization History  Administered Date(s) Administered   Influenza Inj Mdck Quad  01/04/2019   Influenza Inj Mdck Quad  12/27/2017   Influenza Split 01/08/2014   Influenza Whole 01/05/2013   Influenza,inj,Quad  01/15/2021   Influenza-Unspecified 01/05/2015   PFIZER-SARS-COV-2 Vacc 06/05/2019, 06/27/2019, 03/09/2020   PNEUMOCOCCAL CONJUGATE-20 08/22/2021   PPD Test 01/12/2017, 02/08/2018, 04/27/2019, 05/08/2020   Pfizer Covid-19 Vaccine Bivalent  03/06/2021   Td 08/22/2021   Tdap 06/15/2008    Past Surgical History:  Procedure Laterality Date   ESOPHAGOGASTRODUODENOSCOPY     KNEE ARTHROSCOPY WITH MEDIAL MENISECTOMY Right 02/20/2021   Procedure: KNEE ARTHROSCOPY WITH MEDIAL MENISECTOMY;  Surgeon: Elsie Saas, MD;  Location: [place];  Service: Orthopedics;  Laterality: Right;   KNEE SURGERY     left   ORIF ACETABULAR FRACTURE     left arm 2 times    FHx:    Reviewed / unchanged  SHx:    Reviewed / unchanged   Systems Review:  Constitutional: Denies fever, chills, wt changes, headaches, insomnia, fatigue, night sweats, change in appetite. Eyes: Denies redness, blurred vision, diplopia, discharge, itchy, watery eyes.  ENT: Denies discharge,  congestion, post nasal drip,  epistaxis, sore throat, earache, hearing loss, dental pain, tinnitus, vertigo, sinus pain, snoring.  CV: Denies chest pain, palpitations, irregular heartbeat, syncope, dyspnea, diaphoresis, orthopnea, PND, claudication or edema. Respiratory: denies cough, dyspnea, DOE, pleurisy, hoarseness, laryngitis, wheezing.  Gastrointestinal: Denies dysphagia, odynophagia, heartburn, reflux, water brash, abdominal pain or cramps, nausea, vomiting, bloating, diarrhea, constipation, hematemesis, melena, hematochezia  or hemorrhoids. Genitourinary: Denies dysuria, frequency, urgency, nocturia, hesitancy, discharge, hematuria or flank pain. Musculoskeletal: Denies arthralgias, myalgias, stiffness, jt. swelling, pain, limping or strain/sprain.  Skin: Denies pruritus, rash, hives, warts, acne, eczema or change in skin lesion(s). Neuro: No weakness, tremor, incoordination, spasms, paresthesia or pain. Psychiatric: Denies confusion, memory loss or sensory loss. Endo: Denies change in weight, skin or hair change.  Heme/Lymph: No excessive bleeding, bruising or enlarged lymph nodes.  Physical Exam  BP 134/80   Pulse 60   Temp 97.9 F (36.6 C)   Resp 17   Ht '5\' 8"'$  (1.727 m)   Wt 194 lb 12.8 oz (88.4 kg)   SpO2 96%   BMI 29.62 kg/m   Appears  well nourished, well groomed  and in no distress.  Eyes: PERRLA, EOMs, conjunctiva no swelling or erythema. Sinuses: No frontal/maxillary tenderness ENT/Mouth: EAC's clear, TM's nl w/o erythema, bulging. Nares clear w/o erythema, swelling, exudates. Oropharynx clear without erythema or exudates. Oral hygiene is good. Tongue normal, non obstructing. Hearing intact.  Neck: Supple. Thyroid not palpable. Car 2+/2+ without bruits, nodes or JVD. Chest: Respirations nl with BS clear & equal w/o rales, rhonchi, wheezing or stridor.  Cor: Heart sounds normal w/ regular rate and rhythm without sig. murmurs, gallops, clicks or rubs. Peripheral pulses  normal and equal  without edema.  Abdomen: Soft & bowel sounds normal. Non-tender w/o guarding, rebound, hernias, masses or organomegaly.  Lymphatics: Unremarkable.  Musculoskeletal: Full ROM all peripheral extremities, joint stability, 5/5 strength and normal gait.  Skin: Warm, dry without exposed rashes, lesions or ecchymosis apparent.  Neuro: Cranial nerves intact, reflexes equal bilaterally. Sensory-motor testing grossly intact. Tendon reflexes grossly intact.  Pysch: Alert & oriented x 3.  Insight and judgement nl & appropriate. No ideations.  Assessment and Plan:  1. Essential hypertension  - Continue medication, monitor blood pressure at home.  - Continue DASH diet.  Reminder to go to the ER if any CP,  SOB, nausea, dizziness, severe HA, changes vision/speech.  - CBC with Differential/Platelet - COMPLETE METABOLIC PANEL WITH GFR - Magnesium - TSH  2. Hyperlipidemia, mixed  - Continue diet/meds, exercise,& lifestyle modifications.  - Continue monitor periodic cholesterol/liver & renal functions   - Lipid panel - TSH  3. Abnormal glucose  - Continue diet, exercise  - Lifestyle modifications.  - Monitor appropriate labs.  - Hemoglobin A1c - Insulin, random  4. Vitamin D deficiency  - Continue supplementation  - VITAMIN D 25 Hydroxy   5. Testosterone Deficiency  - Testosterone  6. Medication management  - CBC with Differential/Platelet - COMPLETE METABOLIC PANEL WITH GFR - Magnesium - Lipid panel - TSH - Hemoglobin A1c - Insulin, random - VITAMIN D 25 Hydroxy         Discussed  regular exercise, BP monitoring, weight control to achieve / maintain BMI less than 25 and discussed med and SE's. Recommended labs to assess and monitor clinical status with further disposition pending results of labs.  I discussed the assessment and treatment plan with the patient. The patient was provided an opportunity to ask questions and all were answered. The patient agreed  with the plan and demonstrated an understanding of the instructions.  I provided over 30 minutes of exam, counseling, chart review and  complex critical decision making.   Kirtland Bouchard, MD

## 2021-11-23 NOTE — Patient Instructions (Signed)

## 2021-11-24 ENCOUNTER — Encounter: Payer: Self-pay | Admitting: Internal Medicine

## 2021-11-24 ENCOUNTER — Ambulatory Visit (INDEPENDENT_AMBULATORY_CARE_PROVIDER_SITE_OTHER): Payer: Medicare HMO | Admitting: Internal Medicine

## 2021-11-24 VITALS — BP 134/80 | HR 60 | Temp 97.9°F | Resp 17 | Ht 68.0 in | Wt 194.8 lb

## 2021-11-24 DIAGNOSIS — E559 Vitamin D deficiency, unspecified: Secondary | ICD-10-CM

## 2021-11-24 DIAGNOSIS — E291 Testicular hypofunction: Secondary | ICD-10-CM | POA: Diagnosis not present

## 2021-11-24 DIAGNOSIS — Z79899 Other long term (current) drug therapy: Secondary | ICD-10-CM

## 2021-11-24 DIAGNOSIS — R7309 Other abnormal glucose: Secondary | ICD-10-CM | POA: Diagnosis not present

## 2021-11-24 DIAGNOSIS — I10 Essential (primary) hypertension: Secondary | ICD-10-CM

## 2021-11-24 DIAGNOSIS — E782 Mixed hyperlipidemia: Secondary | ICD-10-CM | POA: Diagnosis not present

## 2021-11-25 LAB — COMPLETE METABOLIC PANEL WITH GFR
AG Ratio: 1.8 (calc) (ref 1.0–2.5)
ALT: 22 U/L (ref 9–46)
AST: 27 U/L (ref 10–35)
Albumin: 4.6 g/dL (ref 3.6–5.1)
Alkaline phosphatase (APISO): 108 U/L (ref 35–144)
BUN: 13 mg/dL (ref 7–25)
CO2: 28 mmol/L (ref 20–32)
Calcium: 10 mg/dL (ref 8.6–10.3)
Chloride: 104 mmol/L (ref 98–110)
Creat: 1.1 mg/dL (ref 0.70–1.35)
Globulin: 2.5 g/dL (calc) (ref 1.9–3.7)
Glucose, Bld: 88 mg/dL (ref 65–99)
Potassium: 5.1 mmol/L (ref 3.5–5.3)
Sodium: 140 mmol/L (ref 135–146)
Total Bilirubin: 0.4 mg/dL (ref 0.2–1.2)
Total Protein: 7.1 g/dL (ref 6.1–8.1)
eGFR: 74 mL/min/{1.73_m2} (ref >=60)

## 2021-11-25 LAB — TSH: TSH: 1.51 mIU/L (ref 0.40–4.50)

## 2021-11-25 LAB — LIPID PANEL
Cholesterol: 139 mg/dL (ref <200)
HDL: 56 mg/dL (ref >=40)
LDL Cholesterol (Calc): 61 mg/dL (calc)
Non-HDL Cholesterol (Calc): 83 mg/dL (calc) (ref <130)
Total CHOL/HDL Ratio: 2.5 (calc) (ref <5.0)
Triglycerides: 132 mg/dL (ref <150)

## 2021-11-25 LAB — INSULIN, RANDOM: Insulin: 18.5 u[IU]/mL — ABNORMAL HIGH

## 2021-11-25 LAB — CBC WITH DIFFERENTIAL/PLATELET
Absolute Monocytes: 388 cells/uL (ref 200–950)
Basophils Absolute: 38 cells/uL (ref 0–200)
Basophils Relative: 1 %
Eosinophils Absolute: 220 cells/uL (ref 15–500)
Eosinophils Relative: 5.8 %
HCT: 44.2 % (ref 38.5–50.0)
Hemoglobin: 14.2 g/dL (ref 13.2–17.1)
Lymphs Abs: 946 cells/uL (ref 850–3900)
MCH: 26.6 pg — ABNORMAL LOW (ref 27.0–33.0)
MCHC: 32.1 g/dL (ref 32.0–36.0)
MCV: 82.9 fL (ref 80.0–100.0)
MPV: 9.9 fL (ref 7.5–12.5)
Monocytes Relative: 10.2 %
Neutro Abs: 2208 cells/uL (ref 1500–7800)
Neutrophils Relative %: 58.1 %
Platelets: 268 10*3/uL (ref 140–400)
RBC: 5.33 10*6/uL (ref 4.20–5.80)
RDW: 13.8 % (ref 11.0–15.0)
Total Lymphocyte: 24.9 %
WBC: 3.8 10*3/uL (ref 3.8–10.8)

## 2021-11-25 LAB — HEMOGLOBIN A1C
Hgb A1c MFr Bld: 5.7 % of total Hgb — ABNORMAL HIGH (ref <5.7)
Mean Plasma Glucose: 117 mg/dL
eAG (mmol/L): 6.5 mmol/L

## 2021-11-25 LAB — VITAMIN D 25 HYDROXY (VIT D DEFICIENCY, FRACTURES): Vit D, 25-Hydroxy: 94 ng/mL (ref 30–100)

## 2021-11-25 LAB — MAGNESIUM: Magnesium: 2.3 mg/dL (ref 1.5–2.5)

## 2021-11-25 LAB — TESTOSTERONE: Testosterone: 449 ng/dL (ref 250–827)

## 2021-11-25 NOTE — Progress Notes (Signed)
<><><><><><><><><><><><><><><><><><><><><><><><><><><><><><><><><> <><><><><><><><><><><><><><><><><><><><><><><><><><><><><><><><><> -   Test results slightly outside the reference range are not unusual. If there is anything important, I will review this with you,  otherwise it is considered normal test values.  If you have further questions,  please do not hesitate to contact me at the office or via My Chart.  <><><><><><><><><><><><><><><><><><><><><><><><><><><><><><><><><> <><><><><><><><><><><><><><><><><><><><><><><><><><><><><><><><><>  -  A1c  = 5.7 %  is borderline elevated sugar , So  - Avoid Sweets, Candy & White Stuff   - White Rice, White Potatoes, White Flour  - Breads &  Pasta <><><><><><><><><><><><><><><><><><><><><><><><><><><><><><><><><>  -  Total Chol = 139  -  Excellent   - Very low risk for Heart Attack  / Stroke <><><><><><><><><><><><><><><><><><><><><><><><><><><><><><><><><>  -   Vitamin D = 95   -   Please keep dose same  !  <><><><><><><><><><><><><><><><><><><><><><><><><><><><><><><><><>  -  Testosterone level Normal & OK

## 2021-11-30 ENCOUNTER — Encounter: Payer: Self-pay | Admitting: Internal Medicine

## 2021-12-16 ENCOUNTER — Ambulatory Visit (INDEPENDENT_AMBULATORY_CARE_PROVIDER_SITE_OTHER): Payer: Medicare HMO | Admitting: Nurse Practitioner

## 2021-12-16 ENCOUNTER — Encounter: Payer: Self-pay | Admitting: Nurse Practitioner

## 2021-12-16 VITALS — BP 128/88 | HR 60 | Temp 97.9°F | Ht 68.0 in | Wt 197.0 lb

## 2021-12-16 DIAGNOSIS — R051 Acute cough: Secondary | ICD-10-CM | POA: Diagnosis not present

## 2021-12-16 DIAGNOSIS — H5789 Other specified disorders of eye and adnexa: Secondary | ICD-10-CM | POA: Diagnosis not present

## 2021-12-16 DIAGNOSIS — J0100 Acute maxillary sinusitis, unspecified: Secondary | ICD-10-CM | POA: Diagnosis not present

## 2021-12-16 MED ORDER — PREDNISONE 20 MG PO TABS: ORAL_TABLET | ORAL | 0 refills | Status: DC

## 2021-12-16 MED ORDER — AMOXICILLIN-POT CLAVULANATE 875-125 MG PO TABS: 1.0000 | ORAL_TABLET | Freq: Two times a day (BID) | ORAL | 0 refills | Status: AC

## 2021-12-16 NOTE — Progress Notes (Signed)
Assessment and Plan:  Gerald [name] was seen today for an episodic visit.  Diagnoses and all order for this visit:  1. Acute non-recurrent maxillary sinusitis Take antibiotic in full, even if you are feeling better, to help reduce antibiotic resistance.   - amoxicillin-clavulanate (AUGMENTIN) 875-125 MG tablet; Take 1 tablet by mouth 2 (two) times daily for 5 days.  Dispense: 10 tablet; Refill: 0 - predniSONE (DELTASONE) 20 MG tablet; Take 2 tabs (40 mg) for 3 days followed by 1 tab (20 mg) for 4 days.  Dispense: 10 tablet; Refill: 0  2. Acute cough Continue Albuterol PRN Continue to stay well hydrated. Cough medication for symptomatic relief.    3. Eye discharge Warm cloth and baby shampoo PRN  Notify office for further evaluation and treatment, questions or concerns if s/s fail to improve. The risks and benefits of my recommendations, as well as other treatment options were discussed with the patient today. Questions were answered.  Further disposition pending results of labs. Discussed med's effects and SE's.    Over 15 minutes of exam, counseling, chart review, and critical decision making was performed.   Future Appointments  Date Time Provider [place]  02/24/2022 10:45 AM Alycia Rossetti, NP [facility] None  06/25/2022  2:00 PM Unk [name], MD [facility] None  09/29/2022 11:00 AM Alycia Rossetti, NP [facility] None    ------------------------------------------------------------------------------------------------------------------   HPI BP 128/88   Pulse 60   Temp 97.9 F (36.6 C)   Ht '5\' 8"'$  (1.727 m)   Wt 197 lb (89.4 kg)   SpO2 96%   BMI 29.95 kg/m    Patient complains of symptoms of a URI, possible sinusitis. Symptoms include congestion, facial pain, headache described as ache, nasal congestion, non productive cough, post nasal drip, and sinus pressure. Onset of symptoms was 2 weeks ago, and has been unchanged since that time. Treatment to  date: cough suppressants and decongestants.  Denies fever, chills, N/V.  Completed an in home Covid test last evening that was negative.    Past Medical History:  Diagnosis Date   Acute medial meniscus tear of right knee 02/07/2021   Headache    Hyperlipidemia    Hypertension    Pre-diabetes    no meds   Vitamin D deficiency      No Known Allergies  Current Outpatient Medications on File Prior to Visit  Medication Sig   albuterol (VENTOLIN HFA) 108 (90 Base) MCG/ACT inhaler Inhale 2 puffs into the lungs every 6 (six) hours as needed for wheezing or shortness of breath.   ALPRAZolam (XANAX) 0.5 MG tablet TAKE 1/2-1 TAB 2-3X DAILY IF NEEDED FOR ANXIETY ATTACK (MAX 5 DAYS/WEEK TO AVOID ADDICTION/DEMENTIA)   Ascorbic Acid (VITAMIN C) 1000 MG tablet Take 1,000 mg by mouth 2 (two) times daily.   aspirin EC 81 MG tablet Take 81 mg by mouth daily. Swallow whole.   butalbital-acetaminophen-caffeine (FIORICET) 50-325-40 MG tablet TAKE 1 TABLET EVERY 4 TO 6 HOURS ONLY IF NEEDED FOR SEVERE HEADACHE   Cholecalciferol (VITAMIN D-3) 5000 UNITS TABS Take 5,000 Units by mouth daily.    clobetasol cream (TEMOVATE) 0.05 % APPLY TO RASH TWICE DAILY AS NEEDED   cyclobenzaprine (FLEXERIL) 10 MG tablet Take 10 mg by mouth as needed for muscle spasms.   Flaxseed, Linseed, (FLAX SEED OIL PO) Take 1,000 mg by mouth 3 (three) times daily.    Glucosamine-Chondroitin (GLUCOSAMINE CHONDR COMPLEX PO) Take by mouth. Take 2 tablets daily   guaiFENesin (MUCINEX PO)  Take by mouth.   ibuprofen (ADVIL) 600 MG tablet Take 1 tablet (600 mg total) by mouth every 6 (six) hours as needed.   Magnesium 500 MG TABS Take 1 tablet (500 mg total) by mouth daily.   Melatonin 10 MG CAPS Take by mouth Nightly.   moexipril (UNIVASC) 15 MG tablet TAKE 1 TABLET (15 MG TOTAL) BY MOUTH DAILY. FOR BLOOD PRESSURE   Omega-3 Fatty Acids (FISH OIL PO) Take 1,000 mg by mouth 3 (three) times daily.    pregabalin (LYRICA) 200 MG capsule Take 1  capsule 2 x /day   Probiotic Product (ALIGN) 4 MG CAPS Take 1 capsule daily for Probiotic Benefit   rosuvastatin (CRESTOR) 20 MG tablet Take  1 tablet   Daily  for Cholesterol   SUMAtriptan Succinate (ONZETRA XSAIL NA) Place 1 application into the nose See admin instructions. Once as needed with onset of migraine, up to twice per episode, 2 hours apart.   tadalafil (CIALIS) 5 MG tablet Take  1 tablet  Daily  for Prostatism   Testosterone 10 MG/ACT (2%) GEL APPLY THE CONTENTS OF 4    PUMP ACTUATIONS DAILY TO   THIGHS   vitamin B-12 (CYANOCOBALAMIN) 100 MCG tablet Take 100 mcg by mouth daily.   zolmitriptan (ZOMIG) 5 MG tablet TAKE ONE TABLET BY MOUTH AT ONSET OF HEADACHE; MAY REPEAT ONE TABLET IN 2 HOURS IF NEEDED.   No current facility-administered medications on file prior to visit.    ROS: all negative except what is noted in the HPI.   Physical Exam:  BP 128/88   Pulse 60   Temp 97.9 F (36.6 C)   Ht '5\' 8"'$  (1.727 m)   Wt 197 lb (89.4 kg)   SpO2 96%   BMI 29.95 kg/m   General Appearance: NAD.  Awake, conversant and cooperative. Eyes: PERRLA, EOMs intact.  Left sclera with mild erythema.     Sinuses: Frontal/maxillary tenderness.  Nasal discharge present. Nares patent.  ENT/Mouth: Ext aud canals clear.  Bilateral TMs w/DOL and without erythema or bulging. Hearing intact.  Posterior pharynx without swelling or exudate.  Tonsils without swelling or erythema.  Neck: Supple.  No masses, nodules or thyromegaly. Respiratory: Effort is regular with non-labored breathing. Breath sounds are equal bilaterally without rales, rhonchi, wheezing or stridor.  Cardio: RRR with no MRGs. Brisk peripheral pulses without edema.  Abdomen: Active BS in all four quadrants.  Soft and non-tender without guarding, rebound tenderness, hernias or masses. Lymphatics: Non tender without lymphadenopathy.  Musculoskeletal: Full ROM, 5/5 strength, normal ambulation.  No clubbing or cyanosis. Skin: Appropriate  color for ethnicity. Warm without rashes, lesions, ecchymosis, ulcers.  Neuro: CN II-XII grossly normal. Normal muscle tone without cerebellar symptoms and intact sensation.   Psych: AO X 3,  appropriate mood and affect, insight and judgment.     Darrol Jump, NP 10:19 AM [facility]

## 2021-12-16 NOTE — Patient Instructions (Signed)

## 2021-12-17 ENCOUNTER — Other Ambulatory Visit: Payer: Self-pay

## 2021-12-17 DIAGNOSIS — G43009 Migraine without aura, not intractable, without status migrainosus: Secondary | ICD-10-CM

## 2021-12-17 MED ORDER — ZOLMITRIPTAN 5 MG PO TABS: ORAL_TABLET | ORAL | 2 refills | Status: DC

## 2022-01-04 DIAGNOSIS — I2510 Atherosclerotic heart disease of native coronary artery without angina pectoris: Secondary | ICD-10-CM | POA: Diagnosis not present

## 2022-01-04 DIAGNOSIS — S199XXA Unspecified injury of neck, initial encounter: Secondary | ICD-10-CM | POA: Diagnosis not present

## 2022-01-04 DIAGNOSIS — S82221A Displaced transverse fracture of shaft of right tibia, initial encounter for closed fracture: Secondary | ICD-10-CM | POA: Diagnosis not present

## 2022-01-04 DIAGNOSIS — S3992XA Unspecified injury of lower back, initial encounter: Secondary | ICD-10-CM | POA: Diagnosis not present

## 2022-01-04 DIAGNOSIS — M47817 Spondylosis without myelopathy or radiculopathy, lumbosacral region: Secondary | ICD-10-CM | POA: Diagnosis not present

## 2022-01-04 DIAGNOSIS — S00511A Abrasion of lip, initial encounter: Secondary | ICD-10-CM | POA: Diagnosis not present

## 2022-01-04 DIAGNOSIS — S99921A Unspecified injury of right foot, initial encounter: Secondary | ICD-10-CM | POA: Diagnosis not present

## 2022-01-04 DIAGNOSIS — S82301A Unspecified fracture of lower end of right tibia, initial encounter for closed fracture: Secondary | ICD-10-CM | POA: Diagnosis not present

## 2022-01-04 DIAGNOSIS — S82831A Other fracture of upper and lower end of right fibula, initial encounter for closed fracture: Secondary | ICD-10-CM | POA: Diagnosis not present

## 2022-01-04 DIAGNOSIS — S6991XA Unspecified injury of right wrist, hand and finger(s), initial encounter: Secondary | ICD-10-CM | POA: Diagnosis not present

## 2022-01-04 DIAGNOSIS — M4317 Spondylolisthesis, lumbosacral region: Secondary | ICD-10-CM | POA: Diagnosis not present

## 2022-01-04 DIAGNOSIS — S299XXA Unspecified injury of thorax, initial encounter: Secondary | ICD-10-CM | POA: Diagnosis not present

## 2022-01-04 DIAGNOSIS — S99912A Unspecified injury of left ankle, initial encounter: Secondary | ICD-10-CM | POA: Diagnosis not present

## 2022-01-04 DIAGNOSIS — S93121A Dislocation of metatarsophalangeal joint of right great toe, initial encounter: Secondary | ICD-10-CM | POA: Diagnosis not present

## 2022-01-04 DIAGNOSIS — S025XXA Fracture of tooth (traumatic), initial encounter for closed fracture: Secondary | ICD-10-CM | POA: Diagnosis not present

## 2022-01-04 DIAGNOSIS — M25572 Pain in left ankle and joints of left foot: Secondary | ICD-10-CM | POA: Diagnosis not present

## 2022-01-04 DIAGNOSIS — M25551 Pain in right hip: Secondary | ICD-10-CM | POA: Diagnosis not present

## 2022-01-04 DIAGNOSIS — S3993XA Unspecified injury of pelvis, initial encounter: Secondary | ICD-10-CM | POA: Diagnosis not present

## 2022-01-04 DIAGNOSIS — I10 Essential (primary) hypertension: Secondary | ICD-10-CM | POA: Diagnosis not present

## 2022-01-04 DIAGNOSIS — W19XXXA Unspecified fall, initial encounter: Secondary | ICD-10-CM | POA: Diagnosis not present

## 2022-01-04 DIAGNOSIS — S3991XA Unspecified injury of abdomen, initial encounter: Secondary | ICD-10-CM | POA: Diagnosis not present

## 2022-01-04 DIAGNOSIS — W11XXXA Fall on and from ladder, initial encounter: Secondary | ICD-10-CM | POA: Diagnosis not present

## 2022-01-04 DIAGNOSIS — S99922A Unspecified injury of left foot, initial encounter: Secondary | ICD-10-CM | POA: Diagnosis not present

## 2022-01-04 DIAGNOSIS — S82851A Displaced trimalleolar fracture of right lower leg, initial encounter for closed fracture: Secondary | ICD-10-CM | POA: Diagnosis not present

## 2022-01-04 DIAGNOSIS — W208XXA Other cause of strike by thrown, projected or falling object, initial encounter: Secondary | ICD-10-CM | POA: Diagnosis not present

## 2022-01-04 DIAGNOSIS — Z743 Need for continuous supervision: Secondary | ICD-10-CM | POA: Diagnosis not present

## 2022-01-04 DIAGNOSIS — S0993XA Unspecified injury of face, initial encounter: Secondary | ICD-10-CM | POA: Diagnosis not present

## 2022-01-04 DIAGNOSIS — S022XXA Fracture of nasal bones, initial encounter for closed fracture: Secondary | ICD-10-CM | POA: Diagnosis not present

## 2022-01-05 DIAGNOSIS — I10 Essential (primary) hypertension: Secondary | ICD-10-CM | POA: Diagnosis not present

## 2022-01-05 DIAGNOSIS — S3993XA Unspecified injury of pelvis, initial encounter: Secondary | ICD-10-CM | POA: Diagnosis not present

## 2022-01-05 DIAGNOSIS — S3992XA Unspecified injury of lower back, initial encounter: Secondary | ICD-10-CM | POA: Diagnosis not present

## 2022-01-05 DIAGNOSIS — S0993XA Unspecified injury of face, initial encounter: Secondary | ICD-10-CM | POA: Diagnosis not present

## 2022-01-05 DIAGNOSIS — W208XXA Other cause of strike by thrown, projected or falling object, initial encounter: Secondary | ICD-10-CM | POA: Diagnosis not present

## 2022-01-05 DIAGNOSIS — S93121A Dislocation of metatarsophalangeal joint of right great toe, initial encounter: Secondary | ICD-10-CM | POA: Diagnosis not present

## 2022-01-05 DIAGNOSIS — S99912A Unspecified injury of left ankle, initial encounter: Secondary | ICD-10-CM | POA: Diagnosis not present

## 2022-01-05 DIAGNOSIS — S93124A Dislocation of metatarsophalangeal joint of right lesser toe(s), initial encounter: Secondary | ICD-10-CM | POA: Diagnosis not present

## 2022-01-05 DIAGNOSIS — Z7982 Long term (current) use of aspirin: Secondary | ICD-10-CM | POA: Diagnosis not present

## 2022-01-05 DIAGNOSIS — E785 Hyperlipidemia, unspecified: Secondary | ICD-10-CM | POA: Diagnosis not present

## 2022-01-05 DIAGNOSIS — S82201A Unspecified fracture of shaft of right tibia, initial encounter for closed fracture: Secondary | ICD-10-CM | POA: Diagnosis not present

## 2022-01-05 DIAGNOSIS — S82301A Unspecified fracture of lower end of right tibia, initial encounter for closed fracture: Secondary | ICD-10-CM | POA: Diagnosis not present

## 2022-01-05 DIAGNOSIS — S6991XA Unspecified injury of right wrist, hand and finger(s), initial encounter: Secondary | ICD-10-CM | POA: Diagnosis not present

## 2022-01-05 DIAGNOSIS — S299XXA Unspecified injury of thorax, initial encounter: Secondary | ICD-10-CM | POA: Diagnosis not present

## 2022-01-05 DIAGNOSIS — S3991XA Unspecified injury of abdomen, initial encounter: Secondary | ICD-10-CM | POA: Diagnosis not present

## 2022-01-05 DIAGNOSIS — S60812A Abrasion of left wrist, initial encounter: Secondary | ICD-10-CM | POA: Diagnosis not present

## 2022-01-05 DIAGNOSIS — S199XXA Unspecified injury of neck, initial encounter: Secondary | ICD-10-CM | POA: Diagnosis not present

## 2022-01-05 DIAGNOSIS — S025XXA Fracture of tooth (traumatic), initial encounter for closed fracture: Secondary | ICD-10-CM | POA: Diagnosis not present

## 2022-01-05 DIAGNOSIS — M4317 Spondylolisthesis, lumbosacral region: Secondary | ICD-10-CM | POA: Diagnosis not present

## 2022-01-05 DIAGNOSIS — I2510 Atherosclerotic heart disease of native coronary artery without angina pectoris: Secondary | ICD-10-CM | POA: Diagnosis not present

## 2022-01-05 DIAGNOSIS — S022XXA Fracture of nasal bones, initial encounter for closed fracture: Secondary | ICD-10-CM | POA: Diagnosis not present

## 2022-01-05 DIAGNOSIS — S82221A Displaced transverse fracture of shaft of right tibia, initial encounter for closed fracture: Secondary | ICD-10-CM | POA: Diagnosis not present

## 2022-01-05 DIAGNOSIS — W11XXXA Fall on and from ladder, initial encounter: Secondary | ICD-10-CM | POA: Diagnosis not present

## 2022-01-05 DIAGNOSIS — G8918 Other acute postprocedural pain: Secondary | ICD-10-CM | POA: Diagnosis not present

## 2022-01-05 DIAGNOSIS — S82851A Displaced trimalleolar fracture of right lower leg, initial encounter for closed fracture: Secondary | ICD-10-CM | POA: Diagnosis not present

## 2022-01-05 DIAGNOSIS — R339 Retention of urine, unspecified: Secondary | ICD-10-CM | POA: Diagnosis not present

## 2022-01-05 DIAGNOSIS — S80812A Abrasion, left lower leg, initial encounter: Secondary | ICD-10-CM | POA: Diagnosis not present

## 2022-01-05 DIAGNOSIS — M47817 Spondylosis without myelopathy or radiculopathy, lumbosacral region: Secondary | ICD-10-CM | POA: Diagnosis not present

## 2022-01-05 DIAGNOSIS — S82451A Displaced comminuted fracture of shaft of right fibula, initial encounter for closed fracture: Secondary | ICD-10-CM | POA: Diagnosis not present

## 2022-01-05 DIAGNOSIS — S99921A Unspecified injury of right foot, initial encounter: Secondary | ICD-10-CM | POA: Diagnosis not present

## 2022-01-05 DIAGNOSIS — Z79899 Other long term (current) drug therapy: Secondary | ICD-10-CM | POA: Diagnosis not present

## 2022-01-05 DIAGNOSIS — S82231A Displaced oblique fracture of shaft of right tibia, initial encounter for closed fracture: Secondary | ICD-10-CM | POA: Diagnosis not present

## 2022-01-05 DIAGNOSIS — W19XXXA Unspecified fall, initial encounter: Secondary | ICD-10-CM | POA: Diagnosis not present

## 2022-01-05 DIAGNOSIS — S00511A Abrasion of lip, initial encounter: Secondary | ICD-10-CM | POA: Diagnosis not present

## 2022-01-05 DIAGNOSIS — S82461A Displaced segmental fracture of shaft of right fibula, initial encounter for closed fracture: Secondary | ICD-10-CM | POA: Diagnosis not present

## 2022-01-05 DIAGNOSIS — S82831A Other fracture of upper and lower end of right fibula, initial encounter for closed fracture: Secondary | ICD-10-CM | POA: Diagnosis not present

## 2022-01-05 DIAGNOSIS — S99922A Unspecified injury of left foot, initial encounter: Secondary | ICD-10-CM | POA: Diagnosis not present

## 2022-01-06 DIAGNOSIS — R262 Difficulty in walking, not elsewhere classified: Secondary | ICD-10-CM | POA: Diagnosis not present

## 2022-01-06 DIAGNOSIS — S82201A Unspecified fracture of shaft of right tibia, initial encounter for closed fracture: Secondary | ICD-10-CM | POA: Diagnosis not present

## 2022-01-14 DIAGNOSIS — R339 Retention of urine, unspecified: Secondary | ICD-10-CM | POA: Diagnosis not present

## 2022-01-14 DIAGNOSIS — K5900 Constipation, unspecified: Secondary | ICD-10-CM | POA: Diagnosis not present

## 2022-01-14 DIAGNOSIS — Z79899 Other long term (current) drug therapy: Secondary | ICD-10-CM | POA: Diagnosis not present

## 2022-01-14 DIAGNOSIS — I2510 Atherosclerotic heart disease of native coronary artery without angina pectoris: Secondary | ICD-10-CM | POA: Diagnosis not present

## 2022-01-16 DIAGNOSIS — S022XXD Fracture of nasal bones, subsequent encounter for fracture with routine healing: Secondary | ICD-10-CM | POA: Diagnosis not present

## 2022-01-16 DIAGNOSIS — W11XXXD Fall on and from ladder, subsequent encounter: Secondary | ICD-10-CM | POA: Diagnosis not present

## 2022-01-16 DIAGNOSIS — R0981 Nasal congestion: Secondary | ICD-10-CM | POA: Diagnosis not present

## 2022-01-16 DIAGNOSIS — R339 Retention of urine, unspecified: Secondary | ICD-10-CM | POA: Diagnosis not present

## 2022-01-23 DIAGNOSIS — Z7982 Long term (current) use of aspirin: Secondary | ICD-10-CM | POA: Diagnosis not present

## 2022-01-23 DIAGNOSIS — W11XXXD Fall on and from ladder, subsequent encounter: Secondary | ICD-10-CM | POA: Diagnosis not present

## 2022-01-23 DIAGNOSIS — R339 Retention of urine, unspecified: Secondary | ICD-10-CM | POA: Diagnosis not present

## 2022-01-23 DIAGNOSIS — Z79899 Other long term (current) drug therapy: Secondary | ICD-10-CM | POA: Diagnosis not present

## 2022-01-23 DIAGNOSIS — S82251E Displaced comminuted fracture of shaft of right tibia, subsequent encounter for open fracture type I or II with routine healing: Secondary | ICD-10-CM | POA: Diagnosis not present

## 2022-01-29 DIAGNOSIS — R338 Other retention of urine: Secondary | ICD-10-CM | POA: Diagnosis not present

## 2022-01-29 DIAGNOSIS — N9989 Other postprocedural complications and disorders of genitourinary system: Secondary | ICD-10-CM | POA: Diagnosis not present

## 2022-02-02 DIAGNOSIS — Z4789 Encounter for other orthopedic aftercare: Secondary | ICD-10-CM | POA: Diagnosis not present

## 2022-02-03 DIAGNOSIS — H31002 Unspecified chorioretinal scars, left eye: Secondary | ICD-10-CM | POA: Diagnosis not present

## 2022-02-03 DIAGNOSIS — H5213 Myopia, bilateral: Secondary | ICD-10-CM | POA: Diagnosis not present

## 2022-02-05 DIAGNOSIS — Z4789 Encounter for other orthopedic aftercare: Secondary | ICD-10-CM | POA: Diagnosis not present

## 2022-02-10 DIAGNOSIS — R339 Retention of urine, unspecified: Secondary | ICD-10-CM | POA: Diagnosis not present

## 2022-02-10 DIAGNOSIS — R399 Unspecified symptoms and signs involving the genitourinary system: Secondary | ICD-10-CM | POA: Diagnosis not present

## 2022-02-11 ENCOUNTER — Other Ambulatory Visit: Payer: Self-pay | Admitting: Nurse Practitioner

## 2022-02-11 ENCOUNTER — Other Ambulatory Visit: Payer: Self-pay | Admitting: Internal Medicine

## 2022-02-11 DIAGNOSIS — E782 Mixed hyperlipidemia: Secondary | ICD-10-CM

## 2022-02-12 ENCOUNTER — Telehealth: Payer: Self-pay | Admitting: Nurse Practitioner

## 2022-02-12 DIAGNOSIS — M25571 Pain in right ankle and joints of right foot: Secondary | ICD-10-CM | POA: Diagnosis not present

## 2022-02-12 NOTE — Telephone Encounter (Signed)
Pt is requesting a refill on butalbital-acetaminophen-caffeine (FIORICET) 50-325-40 MG tablet  to go to [facility] on file in [facility]

## 2022-02-13 ENCOUNTER — Encounter: Payer: Self-pay | Admitting: Nurse Practitioner

## 2022-02-13 ENCOUNTER — Telehealth: Payer: Self-pay | Admitting: Nurse Practitioner

## 2022-02-13 NOTE — Telephone Encounter (Signed)
He is currently prescribed oxycodone and should not take Fioricet with this medication so has not been refilled

## 2022-02-13 NOTE — Telephone Encounter (Signed)
Patient called again today requesting the [place]. I explained to him that his med list states that he is taking Oxycodone and cannot have both. He says that he broke his leg and that was why he was prescribed Oxycodone. He however says he hasn't taken it in 2 weeks and would like his Fioricet re-filled in its place.

## 2022-02-15 DIAGNOSIS — R399 Unspecified symptoms and signs involving the genitourinary system: Secondary | ICD-10-CM | POA: Diagnosis not present

## 2022-02-15 DIAGNOSIS — R339 Retention of urine, unspecified: Secondary | ICD-10-CM | POA: Diagnosis not present

## 2022-02-17 DIAGNOSIS — M25571 Pain in right ankle and joints of right foot: Secondary | ICD-10-CM | POA: Diagnosis not present

## 2022-02-19 DIAGNOSIS — M25571 Pain in right ankle and joints of right foot: Secondary | ICD-10-CM | POA: Diagnosis not present

## 2022-02-20 DIAGNOSIS — Z79899 Other long term (current) drug therapy: Secondary | ICD-10-CM | POA: Diagnosis not present

## 2022-02-20 DIAGNOSIS — S92311D Displaced fracture of first metatarsal bone, right foot, subsequent encounter for fracture with routine healing: Secondary | ICD-10-CM | POA: Diagnosis not present

## 2022-02-20 DIAGNOSIS — W11XXXD Fall on and from ladder, subsequent encounter: Secondary | ICD-10-CM | POA: Diagnosis not present

## 2022-02-20 DIAGNOSIS — S82201D Unspecified fracture of shaft of right tibia, subsequent encounter for closed fracture with routine healing: Secondary | ICD-10-CM | POA: Diagnosis not present

## 2022-02-20 DIAGNOSIS — S82451D Displaced comminuted fracture of shaft of right fibula, subsequent encounter for closed fracture with routine healing: Secondary | ICD-10-CM | POA: Diagnosis not present

## 2022-02-20 DIAGNOSIS — X58XXXD Exposure to other specified factors, subsequent encounter: Secondary | ICD-10-CM | POA: Diagnosis not present

## 2022-02-20 DIAGNOSIS — Z7982 Long term (current) use of aspirin: Secondary | ICD-10-CM | POA: Diagnosis not present

## 2022-02-20 DIAGNOSIS — S82251D Displaced comminuted fracture of shaft of right tibia, subsequent encounter for closed fracture with routine healing: Secondary | ICD-10-CM | POA: Diagnosis not present

## 2022-02-24 ENCOUNTER — Ambulatory Visit: Payer: Medicare HMO | Admitting: Nurse Practitioner

## 2022-02-24 DIAGNOSIS — R339 Retention of urine, unspecified: Secondary | ICD-10-CM | POA: Diagnosis not present

## 2022-02-24 DIAGNOSIS — M25571 Pain in right ankle and joints of right foot: Secondary | ICD-10-CM | POA: Diagnosis not present

## 2022-02-24 DIAGNOSIS — N138 Other obstructive and reflux uropathy: Secondary | ICD-10-CM | POA: Diagnosis not present

## 2022-02-24 DIAGNOSIS — N401 Enlarged prostate with lower urinary tract symptoms: Secondary | ICD-10-CM | POA: Diagnosis not present

## 2022-03-03 ENCOUNTER — Encounter: Payer: Self-pay | Admitting: Nurse Practitioner

## 2022-03-03 ENCOUNTER — Ambulatory Visit (INDEPENDENT_AMBULATORY_CARE_PROVIDER_SITE_OTHER): Payer: Medicare HMO | Admitting: Nurse Practitioner

## 2022-03-03 VITALS — BP 128/90 | HR 65 | Temp 97.5°F | Ht 68.0 in | Wt 195.2 lb

## 2022-03-03 DIAGNOSIS — E663 Overweight: Secondary | ICD-10-CM | POA: Diagnosis not present

## 2022-03-03 DIAGNOSIS — Z79899 Other long term (current) drug therapy: Secondary | ICD-10-CM | POA: Diagnosis not present

## 2022-03-03 DIAGNOSIS — R339 Retention of urine, unspecified: Secondary | ICD-10-CM | POA: Diagnosis not present

## 2022-03-03 DIAGNOSIS — E782 Mixed hyperlipidemia: Secondary | ICD-10-CM

## 2022-03-03 DIAGNOSIS — E291 Testicular hypofunction: Secondary | ICD-10-CM

## 2022-03-03 DIAGNOSIS — K7689 Other specified diseases of liver: Secondary | ICD-10-CM | POA: Diagnosis not present

## 2022-03-03 DIAGNOSIS — S82201S Unspecified fracture of shaft of right tibia, sequela: Secondary | ICD-10-CM

## 2022-03-03 DIAGNOSIS — I10 Essential (primary) hypertension: Secondary | ICD-10-CM

## 2022-03-03 DIAGNOSIS — I2510 Atherosclerotic heart disease of native coronary artery without angina pectoris: Secondary | ICD-10-CM

## 2022-03-03 DIAGNOSIS — E559 Vitamin D deficiency, unspecified: Secondary | ICD-10-CM | POA: Diagnosis not present

## 2022-03-03 DIAGNOSIS — R7309 Other abnormal glucose: Secondary | ICD-10-CM

## 2022-03-03 NOTE — Patient Instructions (Signed)
Atherosclerosis  Atherosclerosis is when plaque builds up in the arteries. This causes narrowing and hardening of the arteries. Arteries are blood vessels that carry blood from the heart to all parts of the body. This blood contains oxygen. Plaque occurs due to inflammation or from a buildup of fat, cholesterol, calcium, waste products of cells, and a clotting material in the blood (fibrin). Plaque decreases the amount of blood that can flow through the artery. Atherosclerosis can affect any artery in your body, including: Heart arteries. Damage to these arteries may lead to coronary artery disease, which can cause a heart attack. Brain arteries. Damage to these arteries may cause a stroke. Leg, arm, and pelvis arteries. Peripheral artery disease (PAD) may result from damage to these arteries. Kidney arteries. Kidney (renal) failure may result from damage to kidney arteries. Treatment may slow the disease and prevent further damage to your heart, brain, peripheral arteries, and kidneys. What are the causes? This condition develops slowly over many years. The inner layers of your arteries become damaged and allow the gradual buildup of plaque. The exact cause of atherosclerosis is not fully understood. Symptoms of atherosclerosis do not occur until an artery becomes narrow or blocked. What increases the risk? The following factors may make you more likely to develop this condition: Being middle-aged or older. Certain medical conditions, including: High blood pressure. High cholesterol. High blood fats (triglycerides). Diabetes. Sleep apnea. Obesity. Certain lab levels, including: Elevated C-reactive protein (CRP). This is a sign of increased inflammation in your body. Elevated homocysteine levels. This is an amino acid that is associated with heart and blood vessel disease. Using tobacco or nicotine products. A family history of atherosclerosis. Not exercising enough (sedentary  lifestyle). Being stressed. Drinking too much alcohol or using drugs, such as cocaine or methamphetamine. What are the signs or symptoms? Symptoms of atherosclerosis do not occur until the plaque severely narrows or blocks the artery, which decreases blood flow. Sometimes, atherosclerosis does not cause symptoms. Symptoms of this condition include: Coronary artery disease. This may cause chest pain and shortness of breath. Decreased blood supply to your brain, which may cause a stroke. Signs of a stroke may include sudden: Weakness or numbness in your face, arm, or leg, especially on one side of your body. Trouble walking or difficulty moving your arms or legs. Loss of balance or coordination. Confusion. Slurred speech. Trouble speaking, or trouble understanding speech, or both (aphasia). Vision changes in one or both eyes. This may be double vision, blurred vision, or loss of vision. Severe headache with no known cause. The headache is often described as the worst headache ever experienced. PAD, which may cause pain, numbness, or nonhealing wounds, often in your legs and hips. Renal failure. This may cause tiredness, problems with urination, swelling, and itchy skin. How is this diagnosed? This condition is diagnosed based on your medical history and a physical exam. During the exam, your health care provider will: Check your pulse in different places. Listen for a "whooshing" sound over your arteries (bruit). You may also have tests, such as: Blood tests to check your levels of cholesterol, triglycerides, blood sugar, and CRP. Ankle-brachial index to compare blood pressure in your arms to blood pressure in your ankles to see how your blood is flowing. Heart (cardiac) tests. Electrocardiogram (ECG) to check for heart damage. Stress test to see how your heart reacts to exercise. Ultrasound tests. Ultrasound of your peripheral arteries to check blood flow. Echocardiogram to get images of  your heart's  chambers and valves. X-ray tests. Chest X-ray to see if you have an enlarged heart, which is a sign of heart failure. CT scan to check for damage to your heart, brain, or arteries. Angiogram. This is a test where dye is injected and X-rays are used to see the blood flow in the arteries. How is this treated? This condition is treated with lifestyle changes as the first step. These may include: Changing your diet. Losing weight. Reducing stress. Exercising and being physically active more regularly. Quitting smoking. You may also need medicine to: Lower triglycerides and cholesterol. Control blood pressure. Prevent blood clots. Lower inflammation in your body. Control your blood sugar. Sometimes, surgery is needed to: Remove plaque from an artery (endarterectomy). Open or widen a narrowed heart artery or peripheral artery (angioplasty). Create a new path for your blood with one of these procedures: Heart (coronary) artery bypass graft surgery. Peripheral artery bypass graft surgery. Place a small mesh tube (stent) in an artery to open or widen a narrowed artery. Follow these instructions at home: Eating and drinking  Eat a heart-healthy diet. Talk with your health care provider or a dietitian if you need help. A heart-healthy diet involves: Limiting unhealthy fats and increasing healthy fats. Some examples of healthy fats are avocados and olive oil. Eating plant-based foods, such as fruits, vegetables, nuts, whole grains, and legumes (such as peas and lentils). If you drink alcohol: Limit how much you have to: 0-1 drink a day for women who are not pregnant. 0-2 drinks a day for men. Know how much alcohol is in a drink. In the [place] one drink equals one 12 oz bottle of beer (355 mL), one 5 oz glass of wine (148 mL), or one 1 oz glass of hard liquor (44 mL). Lifestyle  Maintain a healthy weight. Lose weight if your health care provider says that you need to do  that. Follow an exercise program as told by your health care provider. Do not use any products that contain nicotine or tobacco. These products include cigarettes, chewing tobacco, and vaping devices, such as e-cigarettes. If you need help quitting, ask your health care provider. Do not use drugs. General instructions Take over-the-counter and prescription medicines only as told by your health care provider. Manage other health conditions as told. Keep all follow-up visits. This is important. Contact a health care provider if you have: An irregular heartbeat. Unexplained tiredness (fatigue). Trouble urinating, or you are producing less urine or foamy urine. Swelling of your hands or feet, or itchy skin. Unexplained pain or numbness in your legs or hips. A wound that is slow to heal or is not healing. Get help right away if: You have any symptoms of a heart attack. These may be: Chest pain. This includes squeezing chest pain that may feel like indigestion (angina). Shortness of breath. Pain in your neck, jaw, arms, back, or stomach. Cold sweat. Nausea. Light-headedness. Sudden pain, numbness, or coldness in a limb. You have any symptoms of a stroke. "BE FAST" is an easy way to remember the main warning signs of a stroke: B - Balance. Signs are dizziness, sudden trouble walking, or loss of balance. E - Eyes. Signs are trouble seeing or a sudden change in vision. F - Face. Signs are sudden weakness or numbness of the face, or the face or eyelid drooping on one side. A - Arms. Signs are weakness or numbness in an arm. This happens suddenly and usually on one side of the body. S -  Speech. Signs are sudden trouble speaking, slurred speech, or trouble understanding what people say. T - Time. Time to call emergency services. Write down what time symptoms started. You have other signs of a stroke, such as: A sudden, severe headache with no known cause. Nausea or vomiting. Seizure. These  symptoms may represent a serious problem that is an emergency. Do not wait to see if the symptoms will go away. Get medical help right away. Call your local emergency services (911 in the [place] Do not drive yourself to the hospital. Summary Atherosclerosis is when plaque builds up in the arteries and causes narrowing and hardening of the arteries. Plaque occurs due to inflammation or from a buildup of fat, cholesterol, calcium, cellular waste products, and fibrin. This condition may not cause any symptoms. Symptoms of atherosclerosis do not occur until the plaque severely narrows or blocks the artery. Treatment starts with lifestyle changes and may include medicines. In some cases, surgery is needed. Get help right away if you have any symptoms of a heart attack or stroke. This information is not intended to replace advice given to you by your health care provider. Make sure you discuss any questions you have with your health care provider. Document Revised: 06/26/2020 Document Reviewed: 06/26/2020 Elsevier Patient Education  [place].

## 2022-03-03 NOTE — Progress Notes (Signed)
FOLLOW UP 3 MONTH  Assessment and Plan:   Hypertension Atypically elevated today; has been well controlled when checked sporadiacally Reduce stress, salt, animal protein; keep daily log rotating times at home and return for recheck NV in 2-4 weeks Monitor blood pressure at home; patient to call if consistently greater than 130/80 Continue DASH diet.   Reminder to go to the ER if any CP, SOB, nausea, dizziness, severe HA, changes vision/speech, left arm numbness and tingling and jaw pain.  Hyperlipidemia Currently at goal; continue statin Continue low cholesterol diet and exercise.  Check lipid panel.   Abnormal glucose Discussed diet/exercise, weight management  Check A1C q83m check CMP  Overweight BMI 29 Long discussion about weight loss, diet, and exercise Recommended diet heavy in fruits and veggies and low in animal meats, cheeses, and dairy products, appropriate calorie intake Patient will work on increasing exercise; Will follow up in 3 months  Vitamin D Def At goal at last visit; continue supplementation to maintain goal of 60-100 Defer Vit D level   Testosterone deficency - continue to monitor, states medication is helping with symptoms of low T.  Androgel 3 pumps a day.  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 Discussed med's effects and SE's.   Over 30 minutes of face to face interview, exam, counseling, chart review, and critical  decision making was performed.    Future Appointments  Date Time Provider DSpivey 06/25/2022  2:00 PM MUnk [name] MD [facility] None  09/29/2022 11:00 AM WAlycia Rossetti NP [facility] None    ----------------------------------------------------------------------------------------------------------------------  HPI 65y.o. male  presents for 3 month follow up on hypertension, HTN, HLD, glucose management, weight and vitamin D deficiency.    [name] he fell off ladder approx 6 ft in the air.  Fractured shaft of right tibia and fibula. He had 1 surgical repair with a rod and screws in place. He had urine retention since surgery and had indwelling catheter x 1 month. Currently he is urinating what he can and then self caths for residual.  He will still get residual cath of 250-500 cc. He is on Flomax and urination has been slightly better.  [facility] He is not having any further leg pain and is in PT currently. He is moving his bowels ok with stool softener BID and miralax. Appetite is good. Currently 50% weight bearing on ball of foot and 100% on heel of foot. Goes back to Ortho 03/20/22  ON CT 01/04/22 severe three vessel coronary artery calcifications were discovered. Previous cardiac cath 09/05/03 no evidence of occlusive coronary disease was mild-mod calcification in proximal portion of cicumflex and left anterior descending.  Was done by Dr. LRex Kras    He has history of migraines; doing well, was followed by Dr. MOrie [facility] but no longer in network; reports migraines in clusters/spurts, variable. Will have several then none for 3-4 months. Takes lyrica 200 mg BID as prophylactic (reduced from 25/month to  current 5-8). Will take ibuprofen for mild which does work 75% of the time. Takes zomig up to twice per week and supplements which fioricet. Also has onzetra for severe that works very well but expensive with insurance. Nurtec didn't  help. Has been on current regimen for 4-5 years, no changes and works fairly well, requests we take over prescribing meds for now. Migraines did get worse at night since having the surgery and self cath.   He is prescribed xanax, takes 0.25 mg PRN at night for sleep, typically takes only 3-4 times per month, increased with stress. Has some stress r/t 41 year old mother, does have in home help.   BMI is Body mass index is 29.68 kg/m., he has been working on diet and exercise,  Wt Readings from Last 3 Encounters:  03/03/22 195 lb 3.2 oz (88.5 kg)  12/16/21 197 lb (89.4 kg)  11/24/21 194 lb 12.8 oz (88.4 kg)   HTN predates 1999.  Negative heart cath in 2008. His blood pressure has been controlled at home (110-120s/70-80s), Moesipril 15 mg QD, today their BP is BP: (!) 128/90,  BP Readings from Last 3 Encounters:  03/03/22 (!) 128/90  12/16/21 128/88  11/24/21 134/80     He does workout. He denies chest pain, shortness of breath, dizziness.   He is on cholesterol medication (rosuvastatin 20 mg daily) and denies myalgias. His cholesterol is at goal. The cholesterol last visit was:   Lab Results  Component Value Date   CHOL 139 11/24/2021   HDL 56 11/24/2021   LDLCALC 61 11/24/2021   TRIG 132 11/24/2021   CHOLHDL 2.5 11/24/2021    He has been working on diet and exercise for hx of intermittent prediabetes (A1c 5.7% / 2011), and denies increased appetite, nausea, paresthesia of the feet, polydipsia, polyuria, visual disturbances and vomiting. Last A1C in the office was:  Lab Results  Component Value Date   HGBA1C 5.7 (H) 11/24/2021    Patient is on Vitamin D supplement for deficiency and at goal last check:   Lab Results  Component Value Date   VD25OH 94 11/24/2021     He has a history of testosterone deficiency (2005) and is on testosterone replacement (gel). He states that the testosterone helps with his energy, libido, muscle mass. Lab Results  Component Value Date    TESTOSTERONE 449 11/24/2021    Current Outpatient Medications on File Prior to Visit  Medication Sig Dispense Refill   albuterol (VENTOLIN HFA) 108 (90 Base) MCG/ACT inhaler Inhale 2 puffs into the lungs every 6 (six) hours as needed for wheezing or shortness of breath. 8 g 2   ALPRAZolam (XANAX) 0.5 MG tablet TAKE 1/2-1 TAB 2-3X DAILY IF NEEDED FOR ANXIETY ATTACK (MAX 5 DAYS/WEEK TO AVOID ADDICTION/DEMENTIA) 90 tablet 0   Ascorbic Acid (VITAMIN C) 1000 MG tablet Take 1,000 mg by mouth 2 (two) times daily.     aspirin EC 81 MG tablet Take 81 mg by mouth daily. Swallow whole.     butalbital-acetaminophen-caffeine (FIORICET) 50-325-40 MG tablet TAKE 1 TABLET EVERY 4 TO 6 HOURS ONLY IF NEEDED FOR SEVERE HEADACHE 30 tablet 0   cefUROXime (CEFTIN) 500 MG tablet Take 500 mg by mouth 2 (two) times daily.     Cholecalciferol (VITAMIN D-3) 5000 UNITS TABS Take 5,000 Units by mouth daily.      clobetasol cream (TEMOVATE) 0.05 % APPLY TO RASH TWICE DAILY AS NEEDED 60 g 3   [employer] STOOL SOFTENER/LAXATIVE 8.6-50 MG tablet  Take 1 tablet by mouth 2 (two) times daily.     cyclobenzaprine (FLEXERIL) 10 MG tablet Take 10 mg by mouth as needed for muscle spasms.     Flaxseed, Linseed, (FLAX SEED OIL PO) Take 1,000 mg by mouth 3 (three) times daily.      Glucosamine-Chondroitin (GLUCOSAMINE CHONDR COMPLEX PO) Take by mouth. Take 2 tablets daily     guaiFENesin (MUCINEX PO) Take by mouth.     ibuprofen (ADVIL) 600 MG tablet Take 1 tablet (600 mg total) by mouth every 6 (six) hours as needed. 30 tablet 0   Magnesium 500 MG TABS Take 1 tablet (500 mg total) by mouth daily. 30 tablet 0   Melatonin 10 MG CAPS Take by mouth Nightly.     moexipril (UNIVASC) 15 MG tablet TAKE 1 TABLET (15 MG TOTAL) BY MOUTH DAILY. FOR BLOOD PRESSURE 90 tablet 1   Omega-3 Fatty Acids (FISH OIL PO) Take 1,000 mg by mouth 3 (three) times daily.      pregabalin (LYRICA) 200 MG capsule Take 1 capsule 2 x /day 180 capsule 3   Probiotic Product  (ALIGN) 4 MG CAPS Take 1 capsule daily for Probiotic Benefit 90 capsule 1   rosuvastatin (CRESTOR) 20 MG tablet TAKE 1 TABLET BY MOUTH EVERY DAY FOR CHOLESTEROL 90 tablet 3   SUMAtriptan Succinate (ONZETRA XSAIL NA) Place 1 application into the nose See admin instructions. Once as needed with onset of migraine, up to twice per episode, 2 hours apart.     tamsulosin (FLOMAX) 0.4 MG CAPS capsule Take 0.4 mg by mouth.     Testosterone 10 MG/ACT (2%) GEL APPLY THE CONTENTS OF 4    PUMP ACTUATIONS DAILY TO   THIGHS 360 g 1   vitamin B-12 (CYANOCOBALAMIN) 100 MCG tablet Take 100 mcg by mouth daily.     zolmitriptan (ZOMIG) 5 MG tablet TAKE ONE TABLET BY MOUTH AT ONSET OF HEADACHE; MAY REPEAT ONE TABLET IN 2 HOURS IF NEEDED. 24 tablet 2   cetirizine (ZYRTEC) 10 MG tablet Take by mouth.     predniSONE (DELTASONE) 20 MG tablet Take 2 tabs (40 mg) for 3 days followed by 1 tab (20 mg) for 4 days. (Patient not taking: Reported on 03/03/2022) 10 tablet 0   tadalafil (CIALIS) 5 MG tablet Take  1 tablet  Daily  for Prostatism (Patient not taking: Reported on 03/03/2022) 90 tablet 3   No current facility-administered medications on file prior to visit.     Allergies: No Known Allergies   Medical History:  Past Medical History:  Diagnosis Date   Acute medial meniscus tear of right knee 02/07/2021   Headache    Hyperlipidemia    Hypertension    Pre-diabetes    no meds   Vitamin D deficiency    Family history- Reviewed and unchanged Social history- Reviewed and unchanged   Review of Systems:  Review of Systems  Constitutional:  Negative for chills, fever, malaise/fatigue and weight loss.  HENT:  Negative for congestion, hearing loss, sinus pain, sore throat and tinnitus.   Eyes:  Negative for blurred vision and double vision.  Respiratory:  Negative for cough, sputum production, shortness of breath and wheezing.   Cardiovascular:  Negative for chest pain, palpitations, orthopnea, claudication and  leg swelling.  Gastrointestinal:  Negative for abdominal pain, blood in stool, constipation, diarrhea, heartburn, melena, nausea and vomiting.  Genitourinary:  Negative for dysuria and urgency.       Difficulty starting urination and urine  retention  Musculoskeletal:  Negative for back pain, falls, joint pain and myalgias.       Fracture of right tibia and fibula, denies pain, undergoing PT  Skin:  Negative for rash.  Neurological:  Positive for headaches (improved, stable). Negative for dizziness, tingling, sensory change and weakness.  Endo/Heme/Allergies:  Negative for environmental allergies and polydipsia.  Psychiatric/Behavioral:  Negative for depression and suicidal ideas. The patient is not nervous/anxious and does not have insomnia (occasional when stressed).   All other systems reviewed and are negative.    Physical Exam: BP (!) 128/90   Pulse 65   Temp (!) 97.5 F (36.4 C)   Ht '5\' 8"'$  (1.727 m)   Wt 195 lb 3.2 oz (88.5 kg)   SpO2 97%   BMI 29.68 kg/m  Wt Readings from Last 3 Encounters:  03/03/22 195 lb 3.2 oz (88.5 kg)  12/16/21 197 lb (89.4 kg)  11/24/21 194 lb 12.8 oz (88.4 kg)   General Appearance: Well nourished, in no apparent distress. Eyes: PERRLA, EOMs, conjunctiva no swelling or erythema Sinuses: No Frontal/maxillary tenderness ENT/Mouth: Ext aud canals clear, TMs without erythema, bulging. No erythema, swelling, or exudate on post pharynx.  Tonsils not swollen or erythematous. Hearing normal.  Neck: Supple, thyroid normal.  Respiratory: Respiratory effort normal, BS equal bilaterally without rales, rhonchi, wheezing or stridor.  Cardio: RRR with no MRGs. Brisk peripheral pulses without edema.  Abdomen: Soft, + BS.  Non tender, no guarding, rebound, hernias, masses. Lymphatics: Non tender without lymphadenopathy.  Musculoskeletal: walk ing boot on right foot with less pressure on toe than heel, uses 1 crutch Skin: Warm, dry without rashes, lesions,  ecchymosis.  Neuro: Cranial nerves intact. No cerebellar symptoms.  Psych: Awake and oriented X 3, normal affect, Insight and Judgment appropriate.    Alycia Rossetti, NP 3:25 PM [facility]

## 2022-03-04 LAB — CBC WITH DIFFERENTIAL/PLATELET
Absolute Monocytes: 578 cells/uL (ref 200–950)
Basophils Absolute: 39 cells/uL (ref 0–200)
Basophils Relative: 0.8 %
Eosinophils Absolute: 314 cells/uL (ref 15–500)
Eosinophils Relative: 6.4 %
HCT: 41.8 % (ref 38.5–50.0)
Hemoglobin: 13.6 g/dL (ref 13.2–17.1)
Lymphs Abs: 1607 cells/uL (ref 850–3900)
MCH: 26.3 pg — ABNORMAL LOW (ref 27.0–33.0)
MCHC: 32.5 g/dL (ref 32.0–36.0)
MCV: 80.9 fL (ref 80.0–100.0)
MPV: 9.9 fL (ref 7.5–12.5)
Monocytes Relative: 11.8 %
Neutro Abs: 2362 cells/uL (ref 1500–7800)
Neutrophils Relative %: 48.2 %
Platelets: 295 10*3/uL (ref 140–400)
RBC: 5.17 10*6/uL (ref 4.20–5.80)
RDW: 14 % (ref 11.0–15.0)
Total Lymphocyte: 32.8 %
WBC: 4.9 10*3/uL (ref 3.8–10.8)

## 2022-03-04 LAB — LIPID PANEL
Cholesterol: 170 mg/dL (ref <200)
HDL: 48 mg/dL (ref >=40)
LDL Cholesterol (Calc): 84 mg/dL (calc)
Non-HDL Cholesterol (Calc): 122 mg/dL (calc) (ref <130)
Total CHOL/HDL Ratio: 3.5 (calc) (ref <5.0)
Triglycerides: 286 mg/dL — ABNORMAL HIGH (ref <150)

## 2022-03-04 LAB — COMPLETE METABOLIC PANEL WITH GFR
AG Ratio: 1.6 (calc) (ref 1.0–2.5)
ALT: 18 U/L (ref 9–46)
AST: 19 U/L (ref 10–35)
Albumin: 4.6 g/dL (ref 3.6–5.1)
Alkaline phosphatase (APISO): 151 U/L — ABNORMAL HIGH (ref 35–144)
BUN: 19 mg/dL (ref 7–25)
CO2: 30 mmol/L (ref 20–32)
Calcium: 10.2 mg/dL (ref 8.6–10.3)
Chloride: 104 mmol/L (ref 98–110)
Creat: 1.02 mg/dL (ref 0.70–1.35)
Globulin: 2.9 g/dL (calc) (ref 1.9–3.7)
Glucose, Bld: 90 mg/dL (ref 65–99)
Potassium: 5.2 mmol/L (ref 3.5–5.3)
Sodium: 144 mmol/L (ref 135–146)
Total Bilirubin: 0.3 mg/dL (ref 0.2–1.2)
Total Protein: 7.5 g/dL (ref 6.1–8.1)
eGFR: 82 mL/min/{1.73_m2} (ref >=60)

## 2022-03-04 LAB — MAGNESIUM: Magnesium: 2.2 mg/dL (ref 1.5–2.5)

## 2022-03-06 DIAGNOSIS — M25571 Pain in right ankle and joints of right foot: Secondary | ICD-10-CM | POA: Diagnosis not present

## 2022-03-10 ENCOUNTER — Ambulatory Visit: Payer: Medicare HMO | Attending: Cardiology | Admitting: Cardiology

## 2022-03-10 ENCOUNTER — Encounter: Payer: Self-pay | Admitting: Cardiology

## 2022-03-10 VITALS — BP 106/68 | HR 77 | Ht 68.0 in | Wt 198.0 lb

## 2022-03-10 DIAGNOSIS — E782 Mixed hyperlipidemia: Secondary | ICD-10-CM

## 2022-03-10 DIAGNOSIS — Z7689 Persons encountering health services in other specified circumstances: Secondary | ICD-10-CM

## 2022-03-10 DIAGNOSIS — I2510 Atherosclerotic heart disease of native coronary artery without angina pectoris: Secondary | ICD-10-CM

## 2022-03-10 DIAGNOSIS — R011 Cardiac murmur, unspecified: Secondary | ICD-10-CM | POA: Diagnosis not present

## 2022-03-10 DIAGNOSIS — M25571 Pain in right ankle and joints of right foot: Secondary | ICD-10-CM | POA: Diagnosis not present

## 2022-03-10 MED ORDER — ROSUVASTATIN CALCIUM 40 MG PO TABS: ORAL_TABLET | ORAL | 3 refills | Status: DC

## 2022-03-10 NOTE — Progress Notes (Signed)
Cardiology Office Note:    Date:  03/10/2022   ID:  Gerald [name], DOB [birth year], MRN [id number]  PCP:  Unk [name], MD  Cardiologist:  Berniece Salines, DO  Electrophysiologist:  None   Referring MD: Alycia Rossetti, NP   " I recently had a t  History of Present Illness:    Gerald [name] is a 66 y.o. male with a hx of hypertension, hyperlipidemia, prediabetes, on recent CT of the chest showed severe three-vessel coronary calcification, and obesity.  Referred by his primary care provider due to the abnormal finding on his CT of chest.  He offers no complaints at this time.  He is here with his wife.  Past Medical History:  Diagnosis Date   Acute medial meniscus tear of right knee 02/07/2021   Headache    Hyperlipidemia    Hypertension    Pre-diabetes    no meds   Vitamin D deficiency     Past Surgical History:  Procedure Laterality Date   ESOPHAGOGASTRODUODENOSCOPY     KNEE ARTHROSCOPY WITH MEDIAL MENISECTOMY Right 02/20/2021   Procedure: KNEE ARTHROSCOPY WITH MEDIAL MENISECTOMY;  Surgeon: Elsie Saas, MD;  Location: [facility];  Service: Orthopedics;  Laterality: Right;   KNEE SURGERY     left   ORIF ACETABULAR FRACTURE     left arm 2 times    Current Medications: Current Meds  Medication Sig   ALPRAZolam (XANAX) 0.5 MG tablet TAKE 1/2-1 TAB 2-3X DAILY IF NEEDED FOR ANXIETY ATTACK (MAX 5 DAYS/WEEK TO AVOID ADDICTION/DEMENTIA)   Ascorbic Acid (VITAMIN C) 1000 MG tablet Take 1,000 mg by mouth 2 (two) times daily.   aspirin EC 81 MG tablet Take 81 mg by mouth daily. Swallow whole.   cetirizine (ZYRTEC) 10 MG tablet Take by mouth.   Cholecalciferol (VITAMIN D-3) 5000 UNITS TABS Take 5,000 Units by mouth daily.    clobetasol cream (TEMOVATE) 0.05 % APPLY TO RASH TWICE DAILY AS NEEDED   [employer] STOOL SOFTENER/LAXATIVE 8.6-50 MG tablet Take 1 tablet by mouth 2 (two) times daily.   Flaxseed, Linseed, (FLAX SEED OIL PO) Take 1,000 mg by mouth 3 (three)  times daily.    Glucosamine-Chondroitin (GLUCOSAMINE CHONDR COMPLEX PO) Take by mouth. Take 2 tablets daily   guaiFENesin (MUCINEX PO) Take by mouth.   ibuprofen (ADVIL) 600 MG tablet Take 1 tablet (600 mg total) by mouth every 6 (six) hours as needed.   Magnesium 500 MG TABS Take 1 tablet (500 mg total) by mouth daily.   Melatonin 10 MG CAPS Take by mouth Nightly.   moexipril (UNIVASC) 15 MG tablet TAKE 1 TABLET (15 MG TOTAL) BY MOUTH DAILY. FOR BLOOD PRESSURE   Omega-3 Fatty Acids (FISH OIL PO) Take 1,000 mg by mouth 3 (three) times daily.    pregabalin (LYRICA) 200 MG capsule Take 1 capsule 2 x /day   Probiotic Product (ALIGN) 4 MG CAPS Take 1 capsule daily for Probiotic Benefit   tamsulosin (FLOMAX) 0.4 MG CAPS capsule Take 0.4 mg by mouth.   Testosterone 10 MG/ACT (2%) GEL APPLY THE CONTENTS OF 4    PUMP ACTUATIONS DAILY TO   THIGHS   vitamin B-12 (CYANOCOBALAMIN) 100 MCG tablet Take 100 mcg by mouth daily.   zolmitriptan (ZOMIG) 5 MG tablet TAKE ONE TABLET BY MOUTH AT ONSET OF HEADACHE; MAY REPEAT ONE TABLET IN 2 HOURS IF NEEDED.   [DISCONTINUED] rosuvastatin (CRESTOR) 20 MG tablet TAKE 1 TABLET BY MOUTH EVERY DAY FOR CHOLESTEROL  Allergies:   Patient has no known allergies.   Social History   Socioeconomic History   Marital status: Married    Spouse name: Not on file   Number of children: Not on file   Years of education: Not on file   Highest education level: Not on file  Occupational History   Not on file  Tobacco Use   Smoking status: Never   Smokeless tobacco: Never  Vaping Use   Vaping Use: Never used  Substance and Sexual Activity   Alcohol use: Yes    Alcohol/week: 2.0 - 5.0 standard drinks of alcohol    Types: 2 - 5 Standard drinks or equivalent per week   Drug use: No   Sexual activity: Not on file  Other Topics Concern   Not on file  Social History Narrative   Not on file   Social Determinants of Health   Financial Resource Strain: Not on file  Food  Insecurity: Not on file  Transportation Needs: Not on file  Physical Activity: Not on file  Stress: Not on file  Social Connections: Not on file     Family History: The patient's family history includes Diabetes in his mother; Hyperlipidemia in his mother; Hypertension in his father and mother.  ROS:   Review of Systems  Constitution: Negative for decreased appetite, fever and weight gain.  HENT: Negative for congestion, ear discharge, hoarse voice and sore throat.   Eyes: Negative for discharge, redness, vision loss in right eye and visual halos.  Cardiovascular: Negative for chest pain, dyspnea on exertion, leg swelling, orthopnea and palpitations.  Respiratory: Negative for cough, hemoptysis, shortness of breath and snoring.   Endocrine: Negative for heat intolerance and polyphagia.  Hematologic/Lymphatic: Negative for bleeding problem. Does not bruise/bleed easily.  Skin: Negative for flushing, nail changes, rash and suspicious lesions.  Musculoskeletal: Negative for arthritis, joint pain, muscle cramps, myalgias, neck pain and stiffness.  Gastrointestinal: Negative for abdominal pain, bowel incontinence, diarrhea and excessive appetite.  Genitourinary: Negative for decreased libido, genital sores and incomplete emptying.  Neurological: Negative for brief paralysis, focal weakness, headaches and loss of balance.  Psychiatric/Behavioral: Negative for altered mental status, depression and suicidal ideas.  Allergic/Immunologic: Negative for HIV exposure and persistent infections.    EKGs/Labs/Other Studies Reviewed:    The following studies were reviewed today:   EKG:  The ekg ordered today demonstrates sinus rhythm, 77 bpm  CT chest abdomen pelvis 01/04/2022 Procedure: CT Chest with IV Contrast Procedure: CT Abdomen and Pelvis with IV Contrast  Comparison:  None  Indication:  Trauma; fall  Technique:  CT imaging was performed of the chest, abdomen, and pelvis following  the administration of intravenous contrast.  Iodinated contrast was used due to the indications for the examination, to improve disease detection and to further define anatomy.   3-D maximal intensity projection (MIP) reconstructions of the chest were performed to potentially increase study sensitivity. Coronal and sagittal images were also generated and reviewed.  Findings: Chest:   The visualized thyroid is normal. No enlarged lower cervical, axillary, mediastinal, or hilar lymph nodes. No mediastinal fluid collection. The esophagus is nondilated.  The heart overall is mildly enlarged. Severe three-vessel coronary calcifications. No pericardial effusion. Nondilated thoracic aorta. Normal caliber pulmonary arteries.  Mild bibasilar dependent atelectasis. No focal airspace consolidation. No pleural fluid or pneumothorax. Patent central airways.  The chest wall soft tissues are within normal limits. See dedicated thoracolumbar CT report for osseous findings.  Abdomen and pelvis: Smooth liver  surface contour. Cyst within segment 5. Other tiny hypodensities are too small to characterize. No hepatic trauma. Nondistended gallbladder. No biliary dilation. Normal spleen, pancreas, and adrenal glands. Symmetric renal parenchymal enhancement. No hydronephrosis or perinephric fluid collection. Bilateral cortical and parapelvic renal cysts. Distended urinary bladder. Borderline enlarged prostate.  Nondilated bowel. No abnormal bowel wall thickening. Normal appendix. Somewhat distended stomach. No intra-abdominal free fluid or air. Scattered subcentimeter retroperitoneal lymph nodes are nonspecific although favored reactive. A reference posterior left periaortic node (5/130) measures 8 mm. Overall moderate aortoiliac atherosclerotic calcifications without aneurysm. Patent portal veins.  The abdominal soft tissues are within normal limits. See dedicated thoracolumbar CT report for osseous  findings.  Impression:  1. No acute or traumatic soft tissue findings within the chest, abdomen, or pelvis.  2. Severe coronary calcifications.  See dedicated thoracolumbar CT report for osseous findings.  The preliminary report (critical or emergent communication) was reviewed prior to this dictation and there are no substantial differences between the preliminary results and the impressions in this final report.  Electronically Signed by:  Fabian Sharp, MD, [facility] Electronically Signed on:  01/05/2022 7:51 AM Procedure Note   Recent Labs: 11/24/2021: TSH 1.51 03/03/2022: ALT 18; BUN 19; Creat 1.02; Hemoglobin 13.6; Magnesium 2.2; Platelets 295; Potassium 5.2; Sodium 144  Recent Lipid Panel    Component Value Date/Time   CHOL 170 03/03/2022 1530   TRIG 286 (H) 03/03/2022 1530   HDL 48 03/03/2022 1530   CHOLHDL 3.5 03/03/2022 1530   VLDL 29 10/15/2016 1123   LDLCALC 84 03/03/2022 1530    Physical Exam:    VS:  BP 106/68   Pulse 77   Ht '5\' 8"'$  (1.727 m)   Wt 198 lb (89.8 kg)   SpO2 95%   BMI 30.11 kg/m     Wt Readings from Last 3 Encounters:  03/10/22 198 lb (89.8 kg)  03/03/22 195 lb 3.2 oz (88.5 kg)  12/16/21 197 lb (89.4 kg)     GEN: Well nourished, well developed in no acute distress HEENT: Normal NECK: No JVD; No carotid bruits LYMPHATICS: No lymphadenopathy CARDIAC: S1S2 noted,RRR, 2/6 soft midsystolic ejection murmurs, rubs, gallops RESPIRATORY:  Clear to auscultation without rales, wheezing or rhonchi  ABDOMEN: Soft, non-tender, non-distended, +bowel sounds, no guarding. EXTREMITIES: No edema, No cyanosis, no clubbing MUSCULOSKELETAL:  No deformity  SKIN: Warm and dry NEUROLOGIC:  Alert and oriented x 3, non-focal PSYCHIATRIC:  Normal affect, good insight  ASSESSMENT:    1. Murmur   2. ASCVD (arteriosclerotic cardiovascular disease)   3. Encounter to establish care   4. Mixed hyperlipidemia   5. Hyperlipidemia, mixed    PLAN:    I  spoke with the patient and his wife about the findings in his CT scan.  He is asymptomatic which is good.  He is on aspirin and Crestor for many years.  Few things we talked about is the fact that we will now adjust his LDL goal to less than 70.  I also would like to get a coronary calcium score to quantify properly his coronary calcification and he is in agreement with this.  Blood pressure is acceptable, continue with current antihypertensive regimen.  In terms of his hyperlipidemia we will increase the Crestor to 40 mg daily.  The patient understands the need to lose weight with diet and exercise. We have discussed specific strategies for this.  The patient is in agreement with the above plan. The patient left the office in stable condition.  The patient will follow up in 6 months or sooner if needed.  Medication Adjustments/Labs and Tests Ordered: Current medicines are reviewed at length with the patient today.  Concerns regarding medicines are outlined above.  Orders Placed This Encounter  Procedures   CT CARDIAC SCORING (SELF PAY ONLY)   Lipid panel   EKG 12-Lead   ECHOCARDIOGRAM COMPLETE   Meds ordered this encounter  Medications   rosuvastatin (CRESTOR) 40 MG tablet    Sig: TAKE 1 TABLET BY MOUTH EVERY DAY FOR CHOLESTEROL    Dispense:  90 tablet    Refill:  3    Patient Instructions  Medication Instructions:  Your physician has recommended you make the following change in your medication:  INCREASE: Crestor '40mg'$  daily.  *If you need a refill on your cardiac medications before your next appointment, please call your pharmacy*   Lab Work: Your physician recommends that you return in 12 weeks to have a lipid panel drawn. This is a fasting lab meaning after midnight the night before nothing to eat or drink. The lab hours are Monday-Friday 8am-4:30pm. You do not need an appointment just walk in sign the podium ring the doorbell and the lab technician will come and get you.  If  you have labs (blood work) drawn today and your tests are completely normal, you will receive your results only by: [place] (if you have MyChart) OR A paper copy in the mail If you have any lab test that is abnormal or we need to change your treatment, we will call you to review the results.   Testing/Procedures: Your physician has requested that you have an echocardiogram. Echocardiography is a painless test that uses sound waves to create images of your heart. It provides your doctor with information about the size and shape of your heart and how well your heart's chambers and valves are working. This procedure takes approximately one hour. There are no restrictions for this procedure. Please do NOT wear cologne, perfume, aftershave, or lotions (deodorant is allowed). Please arrive 15 minutes prior to your appointment time.  Dr. Harriet Masson has ordered a CT coronary calcium score.   Test locations:  [place]   This is $99 out of pocket.   Coronary CalciumScan A coronary calcium scan is an imaging test used to look for deposits of calcium and other fatty materials (plaques) in the inner lining of the blood vessels of the heart (coronary arteries). These deposits of calcium and plaques can partly clog and narrow the coronary arteries without producing any symptoms or warning signs. This puts a person at risk for a heart attack. This test can detect these deposits before symptoms develop. Tell a health care provider about: Any allergies you have. All medicines you are taking, including vitamins, herbs, eye drops, creams, and over-the-counter medicines. Any problems you or family members have had with anesthetic medicines. Any blood disorders you have. Any surgeries you have had. Any medical conditions you have. Whether you are pregnant or may be pregnant. What are the risks? Generally, this is a safe procedure. However, problems may occur,  including: Harm to a pregnant woman and her unborn baby. This test involves the use of radiation. Radiation exposure can be dangerous to a pregnant woman and her unborn baby. If you are pregnant, you generally should not have this procedure done. Slight increase in the risk of cancer. This is because of the radiation involved in the test. What happens before the procedure? No  preparation is needed for this procedure. What happens during the procedure? You will undress and remove any jewelry around your neck or chest. You will put on a hospital gown. Sticky electrodes will be placed on your chest. The electrodes will be connected to an electrocardiogram (ECG) machine to record a tracing of the electrical activity of your heart. A CT scanner will take pictures of your heart. During this time, you will be asked to lie still and hold your breath for 2-3 seconds while a picture of your heart is being taken. The procedure may vary among health care providers and hospitals. What happens after the procedure? You can get dressed. You can return to your normal activities. It is up to you to get the results of your test. Ask your health care provider, or the department that is doing the test, when your results will be ready. Summary A coronary calcium scan is an imaging test used to look for deposits of calcium and other fatty materials (plaques) in the inner lining of the blood vessels of the heart (coronary arteries). Generally, this is a safe procedure. Tell your health care provider if you are pregnant or may be pregnant. No preparation is needed for this procedure. A CT scanner will take pictures of your heart. You can return to your normal activities after the scan is done. This information is not intended to replace advice given to you by your health care provider. Make sure you discuss any questions you have with your health care provider. Document Released: 09/19/2007 Document Revised: 02/10/2016  Document Reviewed: 02/10/2016 Elsevier Interactive Patient Education  2017 [place]: At [facility], you and your health needs are our priority.  As part of our continuing mission to provide you with exceptional heart care, we have created designated Provider Care Teams.  These Care Teams include your primary Cardiologist (physician) and Advanced Practice Providers (APPs -  Physician Assistants and Nurse Practitioners) who all work together to provide you with the care you need, when you need it.  We recommend signing up for the patient portal called "MyChart".  Sign up information is provided on this After Visit Summary.  MyChart is used to connect with patients for Virtual Visits (Telemedicine).  Patients are able to view lab/test results, encounter notes, upcoming appointments, etc.  Non-urgent messages can be sent to your provider as well.   To learn more about what you can do with MyChart, go to [email or web address].    Your next appointment:   6 month(s)  The format for your next appointment:   In Person  Provider:   Berniece Salines, DO     Adopting a Healthy Lifestyle.  Know what a healthy weight is for you (roughly BMI <25) and aim to maintain this   Aim for 7+ servings of fruits and vegetables daily   65-80+ fluid ounces of water or unsweet tea for healthy kidneys   Limit to max 1 drink of alcohol per day; avoid smoking/tobacco   Limit animal fats in diet for cholesterol and heart health - choose grass fed whenever available   Avoid highly processed foods, and foods high in saturated/trans fats   Aim for low stress - take time to unwind and care for your mental health   Aim for 150 min of moderate intensity exercise weekly for heart health, and weights twice weekly for bone health   Aim for 7-9 hours of sleep daily   When it comes to  diets, agreement about the perfect plan isnt easy to find, even among the experts. Experts at the [employer] developed an idea known as the Healthy Eating Plate. Just imagine a plate divided into logical, healthy portions.   The emphasis is on diet quality:   Load up on vegetables and fruits - one-half of your plate: Aim for color and variety, and remember that potatoes dont count.   Go for whole grains - one-quarter of your plate: Whole wheat, barley, wheat berries, quinoa, oats, brown rice, and foods made with them. If you want pasta, go with whole wheat pasta.   Protein power - one-quarter of your plate: Fish, chicken, beans, and nuts are all healthy, versatile protein sources. Limit red meat.   The diet, however, does go beyond the plate, offering a few other suggestions.   Use healthy plant oils, such as olive, canola, soy, corn, sunflower and peanut. Check the labels, and avoid partially hydrogenated oil, which have unhealthy trans fats.   If youre thirsty, drink water. Coffee and tea are good in moderation, but skip sugary drinks and limit milk and dairy products to one or two daily servings.   The type of carbohydrate in the diet is more important than the amount. Some sources of carbohydrates, such as vegetables, fruits, whole grains, and beans-are healthier than others.   Finally, stay active  Signed, Berniece Salines, DO  03/10/2022 1:54 PM    [facility]

## 2022-03-10 NOTE — Patient Instructions (Signed)
Medication Instructions:  Your physician has recommended you make the following change in your medication:  INCREASE: Crestor '40mg'$  daily.  *If you need a refill on your cardiac medications before your next appointment, please call your pharmacy*   Lab Work: Your physician recommends that you return in 12 weeks to have a lipid panel drawn. This is a fasting lab meaning after midnight the night before nothing to eat or drink. The lab hours are Monday-Friday 8am-4:30pm. You do not need an appointment just walk in sign the podium ring the doorbell and the lab technician will come and get you.  If you have labs (blood work) drawn today and your tests are completely normal, you will receive your results only by: [place] (if you have MyChart) OR A paper copy in the mail If you have any lab test that is abnormal or we need to change your treatment, we will call you to review the results.   Testing/Procedures: Your physician has requested that you have an echocardiogram. Echocardiography is a painless test that uses sound waves to create images of your heart. It provides your doctor with information about the size and shape of your heart and how well your heart's chambers and valves are working. This procedure takes approximately one hour. There are no restrictions for this procedure. Please do NOT wear cologne, perfume, aftershave, or lotions (deodorant is allowed). Please arrive 15 minutes prior to your appointment time.  Dr. Harriet Masson has ordered a CT coronary calcium score.   Test locations:  [place]   This is $99 out of pocket.   Coronary CalciumScan A coronary calcium scan is an imaging test used to look for deposits of calcium and other fatty materials (plaques) in the inner lining of the blood vessels of the heart (coronary arteries). These deposits of calcium and plaques can partly clog and narrow the coronary arteries without producing any symptoms  or warning signs. This puts a person at risk for a heart attack. This test can detect these deposits before symptoms develop. Tell a health care provider about: Any allergies you have. All medicines you are taking, including vitamins, herbs, eye drops, creams, and over-the-counter medicines. Any problems you or family members have had with anesthetic medicines. Any blood disorders you have. Any surgeries you have had. Any medical conditions you have. Whether you are pregnant or may be pregnant. What are the risks? Generally, this is a safe procedure. However, problems may occur, including: Harm to a pregnant woman and her unborn baby. This test involves the use of radiation. Radiation exposure can be dangerous to a pregnant woman and her unborn baby. If you are pregnant, you generally should not have this procedure done. Slight increase in the risk of cancer. This is because of the radiation involved in the test. What happens before the procedure? No preparation is needed for this procedure. What happens during the procedure? You will undress and remove any jewelry around your neck or chest. You will put on a hospital gown. Sticky electrodes will be placed on your chest. The electrodes will be connected to an electrocardiogram (ECG) machine to record a tracing of the electrical activity of your heart. A CT scanner will take pictures of your heart. During this time, you will be asked to lie still and hold your breath for 2-3 seconds while a picture of your heart is being taken. The procedure may vary among health care providers and hospitals. What happens after the procedure? You  can get dressed. You can return to your normal activities. It is up to you to get the results of your test. Ask your health care provider, or the department that is doing the test, when your results will be ready. Summary A coronary calcium scan is an imaging test used to look for deposits of calcium and other fatty  materials (plaques) in the inner lining of the blood vessels of the heart (coronary arteries). Generally, this is a safe procedure. Tell your health care provider if you are pregnant or may be pregnant. No preparation is needed for this procedure. A CT scanner will take pictures of your heart. You can return to your normal activities after the scan is done. This information is not intended to replace advice given to you by your health care provider. Make sure you discuss any questions you have with your health care provider. Document Released: 09/19/2007 Document Revised: 02/10/2016 Document Reviewed: 02/10/2016 Elsevier Interactive Patient Education  2017 [place]: At [facility], you and your health needs are our priority.  As part of our continuing mission to provide you with exceptional heart care, we have created designated Provider Care Teams.  These Care Teams include your primary Cardiologist (physician) and Advanced Practice Providers (APPs -  Physician Assistants and Nurse Practitioners) who all work together to provide you with the care you need, when you need it.  We recommend signing up for the patient portal called "MyChart".  Sign up information is provided on this After Visit Summary.  MyChart is used to connect with patients for Virtual Visits (Telemedicine).  Patients are able to view lab/test results, encounter notes, upcoming appointments, etc.  Non-urgent messages can be sent to your provider as well.   To learn more about what you can do with MyChart, go to [email or web address].    Your next appointment:   6 month(s)  The format for your next appointment:   In Person  Provider:   Berniece Salines, DO

## 2022-03-12 ENCOUNTER — Other Ambulatory Visit: Payer: Self-pay | Admitting: Nurse Practitioner

## 2022-03-17 ENCOUNTER — Ambulatory Visit
Admission: RE | Admit: 2022-03-17 | Discharge: 2022-03-17 | Disposition: A | Discharge status: Home / Self Care | Payer: Medicare HMO | Source: Ambulatory Visit | Attending: Cardiology | Admitting: Cardiology

## 2022-03-17 DIAGNOSIS — I2510 Atherosclerotic heart disease of native coronary artery without angina pectoris: Secondary | ICD-10-CM | POA: Insufficient documentation

## 2022-03-18 DIAGNOSIS — M25571 Pain in right ankle and joints of right foot: Secondary | ICD-10-CM | POA: Diagnosis not present

## 2022-03-20 DIAGNOSIS — S92311D Displaced fracture of first metatarsal bone, right foot, subsequent encounter for fracture with routine healing: Secondary | ICD-10-CM | POA: Diagnosis not present

## 2022-03-20 DIAGNOSIS — Z7982 Long term (current) use of aspirin: Secondary | ICD-10-CM | POA: Diagnosis not present

## 2022-03-20 DIAGNOSIS — R339 Retention of urine, unspecified: Secondary | ICD-10-CM | POA: Diagnosis not present

## 2022-03-20 DIAGNOSIS — S82401D Unspecified fracture of shaft of right fibula, subsequent encounter for closed fracture with routine healing: Secondary | ICD-10-CM | POA: Diagnosis not present

## 2022-03-20 DIAGNOSIS — W11XXXD Fall on and from ladder, subsequent encounter: Secondary | ICD-10-CM | POA: Diagnosis not present

## 2022-03-20 DIAGNOSIS — S82251D Displaced comminuted fracture of shaft of right tibia, subsequent encounter for closed fracture with routine healing: Secondary | ICD-10-CM | POA: Diagnosis not present

## 2022-03-20 DIAGNOSIS — Z79899 Other long term (current) drug therapy: Secondary | ICD-10-CM | POA: Diagnosis not present

## 2022-03-20 DIAGNOSIS — M79604 Pain in right leg: Secondary | ICD-10-CM | POA: Diagnosis not present

## 2022-03-26 DIAGNOSIS — M25571 Pain in right ankle and joints of right foot: Secondary | ICD-10-CM | POA: Diagnosis not present

## 2022-04-01 ENCOUNTER — Ambulatory Visit (HOSPITAL_COMMUNITY): Payer: Medicare HMO | Attending: Cardiology

## 2022-04-01 DIAGNOSIS — R011 Cardiac murmur, unspecified: Secondary | ICD-10-CM | POA: Diagnosis not present

## 2022-04-01 LAB — ECHOCARDIOGRAM COMPLETE
Area-P 1/2: 3.85 cm2
S' Lateral: 3.7 cm

## 2022-04-02 DIAGNOSIS — M25571 Pain in right ankle and joints of right foot: Secondary | ICD-10-CM | POA: Diagnosis not present

## 2022-04-10 DIAGNOSIS — R339 Retention of urine, unspecified: Secondary | ICD-10-CM | POA: Diagnosis not present

## 2022-04-10 DIAGNOSIS — R399 Unspecified symptoms and signs involving the genitourinary system: Secondary | ICD-10-CM | POA: Diagnosis not present

## 2022-04-13 DIAGNOSIS — M25571 Pain in right ankle and joints of right foot: Secondary | ICD-10-CM | POA: Diagnosis not present

## 2022-04-15 ENCOUNTER — Ambulatory Visit: Payer: Medicare HMO | Attending: Cardiology | Admitting: Cardiology

## 2022-04-15 ENCOUNTER — Encounter: Payer: Self-pay | Admitting: Cardiology

## 2022-04-15 VITALS — BP 130/86 | HR 62 | Ht 68.0 in | Wt 198.2 lb

## 2022-04-15 DIAGNOSIS — I10 Essential (primary) hypertension: Secondary | ICD-10-CM

## 2022-04-15 DIAGNOSIS — R7303 Prediabetes: Secondary | ICD-10-CM

## 2022-04-15 DIAGNOSIS — E782 Mixed hyperlipidemia: Secondary | ICD-10-CM

## 2022-04-15 DIAGNOSIS — I2510 Atherosclerotic heart disease of native coronary artery without angina pectoris: Secondary | ICD-10-CM

## 2022-04-15 NOTE — Progress Notes (Unsigned)
Cardiology Office Note:    Date:  04/20/2022   ID:  Gerald [name], DOB [birth year], MRN [id number]  PCP:  Unk [name], MD  Cardiologist:  Berniece Salines, DO  Electrophysiologist:  None   Referring MD: Unk [name], MD   " I am ok"  History of Present Illness:    Gerald [name] is a 67 y.o. male with a hx of hypertension, hyperlipidemia, prediabetes, on recent CT of the chest showed severe three-vessel coronary calcification, and obesity.  Referred by his primary care provider due to the abnormal finding on his CT of chest.  He offers no complaints at this time.  He is here with his wife.  Past Medical History:  Diagnosis Date   Acute medial meniscus tear of right knee 02/07/2021   Headache    Hyperlipidemia    Hypertension    Pre-diabetes    no meds   Vitamin D deficiency     Past Surgical History:  Procedure Laterality Date   ESOPHAGOGASTRODUODENOSCOPY     KNEE ARTHROSCOPY WITH MEDIAL MENISECTOMY Right 02/20/2021   Procedure: KNEE ARTHROSCOPY WITH MEDIAL MENISECTOMY;  Surgeon: Elsie Saas, MD;  Location: [facility];  Service: Orthopedics;  Laterality: Right;   KNEE SURGERY     left   ORIF ACETABULAR FRACTURE     left arm 2 times    Current Medications: Current Meds  Medication Sig   ALPRAZolam (XANAX) 0.5 MG tablet TAKE 1/2-1 TAB 2-3X DAILY IF NEEDED FOR ANXIETY ATTACK (MAX 5 DAYS/WEEK TO AVOID ADDICTION/DEMENTIA)   Ascorbic Acid (VITAMIN C) 1000 MG tablet Take 1,000 mg by mouth 2 (two) times daily.   aspirin EC 81 MG tablet Take 81 mg by mouth daily. Swallow whole.   cetirizine (ZYRTEC) 10 MG tablet Take by mouth.   Cholecalciferol (VITAMIN D-3) 5000 UNITS TABS Take 5,000 Units by mouth daily.    clobetasol cream (TEMOVATE) 0.05 % APPLY TO RASH TWICE DAILY AS NEEDED   [employer] STOOL SOFTENER/LAXATIVE 8.6-50 MG tablet Take 1 tablet by mouth 2 (two) times daily.   Flaxseed, Linseed, (FLAX SEED OIL PO) Take 1,000 mg by mouth 3 (three) times daily.     Glucosamine-Chondroitin (GLUCOSAMINE CHONDR COMPLEX PO) Take by mouth. Take 2 tablets daily   guaiFENesin (MUCINEX PO) Take by mouth.   ibuprofen (ADVIL) 600 MG tablet Take 1 tablet (600 mg total) by mouth every 6 (six) hours as needed.   Magnesium 500 MG TABS Take 1 tablet (500 mg total) by mouth daily.   Melatonin 10 MG CAPS Take by mouth Nightly.   moexipril (UNIVASC) 15 MG tablet TAKE 1 TABLET (15 MG TOTAL) BY MOUTH DAILY. FOR BLOOD PRESSURE   Omega-3 Fatty Acids (FISH OIL PO) Take 1,000 mg by mouth 3 (three) times daily.    pregabalin (LYRICA) 200 MG capsule Take 1 capsule 2 x /day   Probiotic Product (ALIGN) 4 MG CAPS Take 1 capsule daily for Probiotic Benefit   rosuvastatin (CRESTOR) 40 MG tablet TAKE 1 TABLET BY MOUTH EVERY DAY FOR CHOLESTEROL   tamsulosin (FLOMAX) 0.4 MG CAPS capsule Take 0.4 mg by mouth.   Testosterone 10 MG/ACT (2%) GEL APPLY THE CONTENTS OF 4    PUMP ACTUATIONS DAILY TO   THIGHS   vitamin B-12 (CYANOCOBALAMIN) 100 MCG tablet Take 100 mcg by mouth daily.   zolmitriptan (ZOMIG) 5 MG tablet TAKE ONE TABLET BY MOUTH AT ONSET OF HEADACHE; MAY REPEAT ONE TABLET IN 2 HOURS IF NEEDED.     Allergies:  Patient has no known allergies.   Social History   Socioeconomic History   Marital status: Married    Spouse name: Not on file   Number of children: Not on file   Years of education: Not on file   Highest education level: Not on file  Occupational History   Not on file  Tobacco Use   Smoking status: Never   Smokeless tobacco: Never  Vaping Use   Vaping Use: Never used  Substance and Sexual Activity   Alcohol use: Yes    Alcohol/week: 2.0 - 5.0 standard drinks of alcohol    Types: 2 - 5 Standard drinks or equivalent per week   Drug use: No   Sexual activity: Not on file  Other Topics Concern   Not on file  Social History Narrative   Not on file   Social Determinants of Health   Financial Resource Strain: Not on file  Food Insecurity: Not on file   Transportation Needs: Not on file  Physical Activity: Not on file  Stress: Not on file  Social Connections: Not on file     Family History: The patient's family history includes Diabetes in his mother; Hyperlipidemia in his mother; Hypertension in his father and mother.  ROS:   Review of Systems  Constitution: Negative for decreased appetite, fever and weight gain.  HENT: Negative for congestion, ear discharge, hoarse voice and sore throat.   Eyes: Negative for discharge, redness, vision loss in right eye and visual halos.  Cardiovascular: Negative for chest pain, dyspnea on exertion, leg swelling, orthopnea and palpitations.  Respiratory: Negative for cough, hemoptysis, shortness of breath and snoring.   Endocrine: Negative for heat intolerance and polyphagia.  Hematologic/Lymphatic: Negative for bleeding problem. Does not bruise/bleed easily.  Skin: Negative for flushing, nail changes, rash and suspicious lesions.  Musculoskeletal: Negative for arthritis, joint pain, muscle cramps, myalgias, neck pain and stiffness.  Gastrointestinal: Negative for abdominal pain, bowel incontinence, diarrhea and excessive appetite.  Genitourinary: Negative for decreased libido, genital sores and incomplete emptying.  Neurological: Negative for brief paralysis, focal weakness, headaches and loss of balance.  Psychiatric/Behavioral: Negative for altered mental status, depression and suicidal ideas.  Allergic/Immunologic: Negative for HIV exposure and persistent infections.    EKGs/Labs/Other Studies Reviewed:    The following studies were reviewed today:   EKG:  The ekg ordered today demonstrates sinus rhythm, 77 bpm  TTE 04/01/2022 IMPRESSIONS     1. Left ventricular ejection fraction, by estimation, is 50 to 55%. Left  ventricular ejection fraction by 3D volume is 55 %. Left ventricular  ejection fraction by PLAX is 51 %. The left ventricle has low normal  function. The left ventricle has  no  regional wall motion abnormalities. Left ventricular diastolic parameters  are consistent with Grade I diastolic dysfunction (impaired relaxation).  The average left ventricular global longitudinal strain is -22.9 %. The  global longitudinal strain is  normal.   2. Right ventricular systolic function is normal. The right ventricular  size is normal. There is normal pulmonary artery systolic pressure.   3. Left atrial size was moderately dilated.   4. The mitral valve is normal in structure. Trivial mitral valve  regurgitation. No evidence of mitral stenosis.   5. The aortic valve is tricuspid. Aortic valve regurgitation is not  visualized. No aortic stenosis is present.   6. Aortic dilatation noted. There is mild dilatation of the aortic root,  measuring 39 mm.   7. The inferior vena cava is  normal in size with greater than 50%  respiratory variability, suggesting right atrial pressure of 3 mmHg.   FINDINGS   Left Ventricle: Left ventricular ejection fraction, by estimation, is 50  to 55%. Left ventricular ejection fraction by PLAX is 51 %. Left  ventricular ejection fraction by 3D volume is 55 %. The left ventricle has  low normal function. The left ventricle  has no regional wall motion abnormalities. The average left ventricular  global longitudinal strain is -22.9 %. The global longitudinal strain is  normal. The left ventricular internal cavity size was normal in size.  There is no left ventricular  hypertrophy. Left ventricular diastolic parameters are consistent with  Grade I diastolic dysfunction (impaired relaxation). Indeterminate filling  pressures.   Right Ventricle: The right ventricular size is normal. No increase in  right ventricular wall thickness. Right ventricular systolic function is  normal. There is normal pulmonary artery systolic pressure. The tricuspid  regurgitant velocity is 1.52 m/s, and   with an assumed right atrial pressure of 3 mmHg, the  estimated right  ventricular systolic pressure is 95.1 mmHg.   Left Atrium: Left atrial size was moderately dilated.   Right Atrium: Right atrial size was normal in size.   Pericardium: There is no evidence of pericardial effusion.   Mitral Valve: The mitral valve is normal in structure. Trivial mitral  valve regurgitation. No evidence of mitral valve stenosis.   Tricuspid Valve: The tricuspid valve is normal in structure. Tricuspid  valve regurgitation is trivial. No evidence of tricuspid stenosis.   Aortic Valve: The aortic valve is tricuspid. Aortic valve regurgitation is  not visualized. No aortic stenosis is present.   Pulmonic Valve: The pulmonic valve was normal in structure. Pulmonic valve  regurgitation is not visualized. No evidence of pulmonic stenosis.   Aorta: Aortic dilatation noted. There is mild dilatation of the aortic  root, measuring 39 mm.   Venous: The inferior vena cava is normal in size with greater than 50%  respiratory variability, suggesting right atrial pressure of 3 mmHg.    CT calcium scoring 03/17/2022 TECHNIQUE: The patient was scanned on a Siemens Somatom Definition AS scanner. Axial non-contrast 3 mm slices were carried out through the heart. The data set was analyzed on a dedicated work station and scored using the [place].   FINDINGS: Non-cardiac: See separate report from [facility].   Ascending Aorta: Normal size   Pericardium: Normal   Coronary arteries: Normal origin of left and right coronary arteries. Distribution of arterial calcifications if present, as noted below;   LM 0   LAD 1188   LCx 457   RCA 605   Total 2250   IMPRESSION AND RECOMMENDATION: 1. Coronary calcium score of 2250. This was 98th percentile for age and sex matched control.   2. CAC >300 in LAD, LCx, RCA. CAC-DRS A3/N3.   3. Recommend aspirin and statin if no contraindication.   4. Recommend cardiology consultation.   5.  Continue heart healthy lifestyle and risk factor modification.  Electronically Signed   By: Kate Sable M.D.   On: 03/18/2022 16:55  Recent Labs: 11/24/2021: TSH 1.51 03/03/2022: ALT 18; BUN 19; Creat 1.02; Hemoglobin 13.6; Magnesium 2.2; Platelets 295; Potassium 5.2; Sodium 144  Recent Lipid Panel    Component Value Date/Time   CHOL 115 04/15/2022 1151   TRIG 82 04/15/2022 1151   HDL 47 04/15/2022 1151   CHOLHDL 2.4 04/15/2022 1151   CHOLHDL 3.5 03/03/2022 1530   VLDL  29 10/15/2016 1123   LDLCALC 52 04/15/2022 1151   LDLCALC 84 03/03/2022 1530    Physical Exam:    VS:  BP 130/86   Pulse 62   Ht '5\' 8"'$  (1.727 m)   Wt 89.9 kg   SpO2 94%   BMI 30.14 kg/m     Wt Readings from Last 3 Encounters:  04/15/22 89.9 kg  03/10/22 89.8 kg  03/03/22 88.5 kg     GEN: Well nourished, well developed in no acute distress HEENT: Normal NECK: No JVD; No carotid bruits LYMPHATICS: No lymphadenopathy CARDIAC: S1S2 noted,RRR, 2/6 soft midsystolic ejection murmurs, rubs, gallops RESPIRATORY:  Clear to auscultation without rales, wheezing or rhonchi  ABDOMEN: Soft, non-tender, non-distended, +bowel sounds, no guarding. EXTREMITIES: No edema, No cyanosis, no clubbing MUSCULOSKELETAL:  No deformity  SKIN: Warm and dry NEUROLOGIC:  Alert and oriented x 3, non-focal PSYCHIATRIC:  Normal affect, good insight  ASSESSMENT:    1. Mixed hyperlipidemia   2. Coronary artery disease involving native coronary artery of native heart without angina pectoris   3. Primary hypertension   4. Prediabetes    PLAN:    Coronary calcium score is elevated 2250 - asymptomatic we will continue to monitor.  Will keep the patient on the aspirin 81 mg daily and statin for goal less than 55.  Recent LDL 52.  Blood pressure is acceptable, continue with current antihypertensive regimen.  In terms of his hyperlipidemia we will increase the Crestor to 40 mg daily.  The patient understands the need to lose  weight with diet and exercise. We have discussed specific strategies for this.  The patient is in agreement with the above plan. The patient left the office in stable condition.  The patient will follow up in 6 months or sooner if needed.  Medication Adjustments/Labs and Tests Ordered: Current medicines are reviewed at length with the patient today.  Concerns regarding medicines are outlined above.  Orders Placed This Encounter  Procedures   Lipid Profile   No orders of the defined types were placed in this encounter.   Patient Instructions  Medication Instructions:  Your physician recommends that you continue on your current medications as directed. Please refer to the Current Medication list given to you today.  *If you need a refill on your cardiac medications before your next appointment, please call your pharmacy*   Lab Work: Your physician recommends that you have the following lab drawn today: Lipids  If you have labs (blood work) drawn today and your tests are completely normal, you will receive your results only by: MyChart Message (if you have MyChart) OR A paper copy in the mail If you have any lab test that is abnormal or we need to change your treatment, we will call you to review the results.   Testing/Procedures: NONE   Follow-Up: At [facility], you and your health needs are our priority.  As part of our continuing mission to provide you with exceptional heart care, we have created designated Provider Care Teams.  These Care Teams include your primary Cardiologist (physician) and Advanced Practice Providers (APPs -  Physician Assistants and Nurse Practitioners) who all work together to provide you with the care you need, when you need it.  We recommend signing up for the patient portal called "MyChart".  Sign up information is provided on this After Visit Summary.  MyChart is used to connect with patients for Virtual Visits (Telemedicine).  Patients are  able to view lab/test results,  encounter notes, upcoming appointments, etc.  Non-urgent messages can be sent to your provider as well.   To learn more about what you can do with MyChart, go to [email or web address].    Your next appointment:   1 year(s)  The format for your next appointment:   In Person  Provider:   Berniece Salines, DO     Adopting a Healthy Lifestyle.  Know what a healthy weight is for you (roughly BMI <25) and aim to maintain this   Aim for 7+ servings of fruits and vegetables daily   65-80+ fluid ounces of water or unsweet tea for healthy kidneys   Limit to max 1 drink of alcohol per day; avoid smoking/tobacco   Limit animal fats in diet for cholesterol and heart health - choose grass fed whenever available   Avoid highly processed foods, and foods high in saturated/trans fats   Aim for low stress - take time to unwind and care for your mental health   Aim for 150 min of moderate intensity exercise weekly for heart health, and weights twice weekly for bone health   Aim for 7-9 hours of sleep daily   When it comes to diets, agreement about the perfect plan isnt easy to find, even among the experts. Experts at the [employer] developed an idea known as the Healthy Eating Plate. Just imagine a plate divided into logical, healthy portions.   The emphasis is on diet quality:   Load up on vegetables and fruits - one-half of your plate: Aim for color and variety, and remember that potatoes dont count.   Go for whole grains - one-quarter of your plate: Whole wheat, barley, wheat berries, quinoa, oats, brown rice, and foods made with them. If you want pasta, go with whole wheat pasta.   Protein power - one-quarter of your plate: Fish, chicken, beans, and nuts are all healthy, versatile protein sources. Limit red meat.   The diet, however, does go beyond the plate, offering a few other suggestions.   Use healthy plant oils, such as olive,  canola, soy, corn, sunflower and peanut. Check the labels, and avoid partially hydrogenated oil, which have unhealthy trans fats.   If youre thirsty, drink water. Coffee and tea are good in moderation, but skip sugary drinks and limit milk and dairy products to one or two daily servings.   The type of carbohydrate in the diet is more important than the amount. Some sources of carbohydrates, such as vegetables, fruits, whole grains, and beans-are healthier than others.   Finally, stay active  Signed, Berniece Salines, DO  04/20/2022 4:51 PM    [facility]

## 2022-04-15 NOTE — Patient Instructions (Signed)
Medication Instructions:  Your physician recommends that you continue on your current medications as directed. Please refer to the Current Medication list given to you today.  *If you need a refill on your cardiac medications before your next appointment, please call your pharmacy*   Lab Work: Your physician recommends that you have the following lab drawn today: Lipids  If you have labs (blood work) drawn today and your tests are completely normal, you will receive your results only by: MyChart Message (if you have MyChart) OR A paper copy in the mail If you have any lab test that is abnormal or we need to change your treatment, we will call you to review the results.   Testing/Procedures: NONE   Follow-Up: At [facility], you and your health needs are our priority.  As part of our continuing mission to provide you with exceptional heart care, we have created designated Provider Care Teams.  These Care Teams include your primary Cardiologist (physician) and Advanced Practice Providers (APPs -  Physician Assistants and Nurse Practitioners) who all work together to provide you with the care you need, when you need it.  We recommend signing up for the patient portal called "MyChart".  Sign up information is provided on this After Visit Summary.  MyChart is used to connect with patients for Virtual Visits (Telemedicine).  Patients are able to view lab/test results, encounter notes, upcoming appointments, etc.  Non-urgent messages can be sent to your provider as well.   To learn more about what you can do with MyChart, go to [email or web address].    Your next appointment:   1 year(s)  The format for your next appointment:   In Person  Provider:   Berniece Salines, DO

## 2022-04-16 ENCOUNTER — Telehealth: Payer: Self-pay | Admitting: Cardiology

## 2022-04-16 LAB — LIPID PANEL
Chol/HDL Ratio: 2.4 ratio (ref 0.0–5.0)
Cholesterol, Total: 115 mg/dL (ref 100–199)
HDL: 47 mg/dL (ref >39)
LDL Chol Calc (NIH): 52 mg/dL (ref 0–99)
Triglycerides: 82 mg/dL (ref 0–149)
VLDL Cholesterol Cal: 16 mg/dL (ref 5–40)

## 2022-04-16 NOTE — Telephone Encounter (Signed)
Patient is returning call about lab results

## 2022-04-16 NOTE — Telephone Encounter (Signed)
Spoke to patient advised Dr.Tobb is out of office this afternoon.She will call you back tomorrow to discuss lab results.He requested to be called in the morning after 9:00 am.Advised I will send message to Dr.Tobb.

## 2022-04-16 NOTE — Telephone Encounter (Signed)
Patient returned call, he said the best time to call him tomorrow would be 10am.

## 2022-04-16 NOTE — Telephone Encounter (Signed)
Patient stated he saw lipid result in MyChart and wanted to know if his treatment plan has changed.

## 2022-04-21 DIAGNOSIS — M25571 Pain in right ankle and joints of right foot: Secondary | ICD-10-CM | POA: Diagnosis not present

## 2022-04-28 DIAGNOSIS — D2262 Melanocytic nevi of left upper limb, including shoulder: Secondary | ICD-10-CM | POA: Diagnosis not present

## 2022-04-28 DIAGNOSIS — L820 Inflamed seborrheic keratosis: Secondary | ICD-10-CM | POA: Diagnosis not present

## 2022-04-28 DIAGNOSIS — D2272 Melanocytic nevi of left lower limb, including hip: Secondary | ICD-10-CM | POA: Diagnosis not present

## 2022-04-28 DIAGNOSIS — L298 Other pruritus: Secondary | ICD-10-CM | POA: Diagnosis not present

## 2022-04-28 DIAGNOSIS — D225 Melanocytic nevi of trunk: Secondary | ICD-10-CM | POA: Diagnosis not present

## 2022-04-28 DIAGNOSIS — L570 Actinic keratosis: Secondary | ICD-10-CM | POA: Diagnosis not present

## 2022-04-28 DIAGNOSIS — D485 Neoplasm of uncertain behavior of skin: Secondary | ICD-10-CM | POA: Diagnosis not present

## 2022-04-28 DIAGNOSIS — D2271 Melanocytic nevi of right lower limb, including hip: Secondary | ICD-10-CM | POA: Diagnosis not present

## 2022-04-28 DIAGNOSIS — L538 Other specified erythematous conditions: Secondary | ICD-10-CM | POA: Diagnosis not present

## 2022-04-28 DIAGNOSIS — L814 Other melanin hyperpigmentation: Secondary | ICD-10-CM | POA: Diagnosis not present

## 2022-04-28 DIAGNOSIS — D2261 Melanocytic nevi of right upper limb, including shoulder: Secondary | ICD-10-CM | POA: Diagnosis not present

## 2022-04-30 NOTE — Progress Notes (Signed)
Assessment and Plan:  Diagnoses and all orders for this visit:  Essential hypertension - continue medications, DASH diet, exercise and monitor at home. Call if greater than 130/80.   Mild intermittent asthma with acute exacerbation Continue Zyrtec  Use albuterol 2 puffs every 6 hours as needed Trelegy 1 puff daily and rinse mouth after use- sample given Prednisone taper Follow up in 2 weeks for reevaluation -     Fluticasone-Umeclidin-Vilant (TRELEGY ELLIPTA) 100-62.5-25 MCG/ACT AEPB; Inhale 100 mcg into the lungs daily. -     predniSONE (DELTASONE) 20 MG tablet; 3 tablets daily with food for 3 days, 2 tabs daily for 3 days, 1 tab a day for 5 days. -     albuterol (VENTOLIN HFA) 108 (90 Base) MCG/ACT inhaler; Inhale 2 puffs into the lungs every 6 (six) hours as needed for wheezing or shortness of breath.       Further disposition pending results of labs. Discussed med's effects and SE's.   Over 30 minutes of exam, counseling, chart review, and critical decision making was performed.   Future Appointments  Date Time Provider [place]  06/25/2022  2:00 PM Unk [name], MD [facility] None  09/09/2022 10:20 AM Harriet Masson, Godfrey Pick, DO [facility] None  09/29/2022 11:00 AM Alycia Rossetti, NP [facility] None    ------------------------------------------------------------------------------------------------------------------   HPI BP 120/72   Pulse (!) 59   Temp 98.1 F (36.7 C)   Ht '5\' 8"'$  (1.727 m)   Wt 194 lb (88 kg)   SpO2 99%   BMI 29.50 kg/m   67 y.o.male presents for chest congestion and feels like lungs are constricted.  He had asthma growing up and was allergic to cats, took allergy shots as a child. He somewhat grew out of his asthma. He cleaned out a room at his moms house that previously had 15 cats .He has been using Zyrtec,  mucinex and primatene mist.   BP is currently well controlled with Moexipril 15 mg QD BP Readings from Last 3 Encounters:  05/01/22  120/72  04/15/22 130/86  03/10/22 106/68     Past Medical History:  Diagnosis Date   Acute medial meniscus tear of right knee 02/07/2021   Headache    Hyperlipidemia    Hypertension    Pre-diabetes    no meds   Vitamin D deficiency      No Known Allergies  Current Outpatient Medications on File Prior to Visit  Medication Sig   ALPRAZolam (XANAX) 0.5 MG tablet TAKE 1/2-1 TAB 2-3X DAILY IF NEEDED FOR ANXIETY ATTACK (MAX 5 DAYS/WEEK TO AVOID ADDICTION/DEMENTIA)   Ascorbic Acid (VITAMIN C) 1000 MG tablet Take 1,000 mg by mouth 2 (two) times daily.   aspirin EC 81 MG tablet Take 81 mg by mouth daily. Swallow whole.   cetirizine (ZYRTEC) 10 MG tablet Take by mouth.   Cholecalciferol (VITAMIN D-3) 5000 UNITS TABS Take 5,000 Units by mouth daily.    clobetasol cream (TEMOVATE) 0.05 % APPLY TO RASH TWICE DAILY AS NEEDED   [employer] STOOL SOFTENER/LAXATIVE 8.6-50 MG tablet Take 1 tablet by mouth 2 (two) times daily.   Flaxseed, Linseed, (FLAX SEED OIL PO) Take 1,000 mg by mouth 3 (three) times daily.    Glucosamine-Chondroitin (GLUCOSAMINE CHONDR COMPLEX PO) Take by mouth. Take 2 tablets daily   guaiFENesin (MUCINEX PO) Take by mouth.   ibuprofen (ADVIL) 600 MG tablet Take 1 tablet (600 mg total) by mouth every 6 (six) hours as needed.   Magnesium 500 MG TABS Take  1 tablet (500 mg total) by mouth daily.   Melatonin 10 MG CAPS Take by mouth Nightly.   moexipril (UNIVASC) 15 MG tablet TAKE 1 TABLET (15 MG TOTAL) BY MOUTH DAILY. FOR BLOOD PRESSURE   Omega-3 Fatty Acids (FISH OIL PO) Take 1,000 mg by mouth 3 (three) times daily.    pregabalin (LYRICA) 200 MG capsule Take 1 capsule 2 x /day   Probiotic Product (ALIGN) 4 MG CAPS Take 1 capsule daily for Probiotic Benefit   rosuvastatin (CRESTOR) 40 MG tablet TAKE 1 TABLET BY MOUTH EVERY DAY FOR CHOLESTEROL   tamsulosin (FLOMAX) 0.4 MG CAPS capsule Take 0.4 mg by mouth.   Testosterone 10 MG/ACT (2%) GEL APPLY THE CONTENTS OF 4    PUMP ACTUATIONS  DAILY TO   THIGHS   vitamin B-12 (CYANOCOBALAMIN) 100 MCG tablet Take 100 mcg by mouth daily.   zolmitriptan (ZOMIG) 5 MG tablet TAKE ONE TABLET BY MOUTH AT ONSET OF HEADACHE; MAY REPEAT ONE TABLET IN 2 HOURS IF NEEDED.   No current facility-administered medications on file prior to visit.    ROS: all negative except above.   Physical Exam:  BP 120/72   Pulse (!) 59   Temp 98.1 F (36.7 C)   Ht '5\' 8"'$  (1.727 m)   Wt 194 lb (88 kg)   SpO2 99%   BMI 29.50 kg/m   General Appearance: Well nourished, in no apparent distress. Eyes: PERRLA, EOMs, conjunctiva no swelling or erythema Sinuses: No Frontal/maxillary tenderness ENT/Mouth: Ext aud canals clear, TMs without erythema, bulging. No erythema, swelling, or exudate on post pharynx.  Tonsils not swollen or erythematous. Hearing normal.  Neck: Supple, thyroid normal.  Respiratory: Respiratory effort normal, BS equal bilaterally without rales, rhonchi. Expiratory wheeze Right upper lung, clears with cough Cardio: RRR with no MRGs. Brisk peripheral pulses without edema.  Abdomen: Soft, + BS.  Non tender, no guarding, rebound, hernias, masses. Lymphatics: Non tender without lymphadenopathy.  Musculoskeletal: Full ROM, 5/5 strength, normal gait.  Skin: Warm, dry without rashes, lesions, ecchymosis.  Neuro: Cranial nerves intact. Normal muscle tone, no cerebellar symptoms. Sensation intact.  Psych: Awake and oriented X 3, normal affect, Insight and Judgment appropriate.     Alycia Rossetti, NP 11:11 AM [facility]

## 2022-05-01 ENCOUNTER — Encounter: Payer: Self-pay | Admitting: Nurse Practitioner

## 2022-05-01 ENCOUNTER — Ambulatory Visit (INDEPENDENT_AMBULATORY_CARE_PROVIDER_SITE_OTHER): Payer: Medicare HMO | Admitting: Nurse Practitioner

## 2022-05-01 VITALS — BP 120/72 | HR 59 | Temp 98.1°F | Ht 68.0 in | Wt 194.0 lb

## 2022-05-01 DIAGNOSIS — I10 Essential (primary) hypertension: Secondary | ICD-10-CM

## 2022-05-01 DIAGNOSIS — J4521 Mild intermittent asthma with (acute) exacerbation: Secondary | ICD-10-CM

## 2022-05-01 MED ORDER — PREDNISONE 20 MG PO TABS: ORAL_TABLET | ORAL | 0 refills | Status: AC

## 2022-05-01 MED ORDER — ALBUTEROL SULFATE HFA 108 (90 BASE) MCG/ACT IN AERS: 2.0000 | INHALATION_SPRAY | Freq: Four times a day (QID) | RESPIRATORY_TRACT | 2 refills | Status: AC | PRN

## 2022-05-01 MED ORDER — TRELEGY ELLIPTA 100-62.5-25 MCG/ACT IN AEPB: 100.0000 ug | INHALATION_SPRAY | Freq: Every day | RESPIRATORY_TRACT | 0 refills | Status: DC

## 2022-05-01 NOTE — Patient Instructions (Signed)
Start Prednisone 20 mg 3 tabs x 3 days, 2 tabs x 3 days and 1 tab x 5 days  Albuterol inhaler 2 puffs every 6 hours as needed for chest tightness/ shortness of breath  Trelegy inhaler 1 puff daily x 14 days, rinse mouth after each use  Follow up in 2 weeks   Asthma, Adult  Asthma is a long-term (chronic) condition that causes recurrent episodes in which the lower airways in the lungs become tight and narrow. The narrowing is caused by inflammation and tightening of the smooth muscle around the lower airways. Asthma episodes, also called asthma attacks or asthma flares, may cause coughing, making high-pitched whistling sounds when you breathe, most often when you breathe out (wheezing), shortness of breath, and chest pain. The airways may produce extra mucus caused by the inflammation and irritation. During an attack, it can be difficult to breathe. Asthma attacks can range from minor to life-threatening. Asthma cannot be cured, but medicines and lifestyle changes can help control it and treat acute attacks. It is important to keep your asthma well controlled so the condition does not interfere with your daily life. What are the causes? This condition is believed to be caused by inherited (genetic) and environmental factors, but its exact cause is not known. What can trigger an asthma attack? Many things can bring on an asthma attack or make symptoms worse. These triggers are different for every person. Common triggers include: Allergens and irritants like mold, dust, pet dander, cockroaches, pollen, air pollution, and chemical odors. Cigarette smoke. Weather changes and cold air. Stress and strong emotional responses such as crying or laughing hard. Certain medications such as aspirin or beta blockers. Infections and inflammatory conditions, such as the flu, a cold, pneumonia, or inflammation of the nasal membranes (rhinitis). Gastroesophageal reflux disease (GERD). What are the signs or  symptoms? Symptoms may occur right after exposure to an asthma trigger or hours later and can vary by person. Common signs and symptoms include: Wheezing. Trouble breathing (shortness of breath). Excessive nighttime or early morning coughing. Chest tightness. Tiredness (fatigue) with minimal activity. Difficulty talking in complete sentences. Poor exercise tolerance. How is this diagnosed? This condition is diagnosed based on: A physical exam and your medical history. Tests, which may include: Lung function studies to evaluate the flow of air in your lungs. Allergy tests. Imaging tests, such as X-rays. How is this treated? There is no cure, but symptoms can be controlled with proper treatment. Treatment usually involves: Identifying and avoiding your asthma triggers. Inhaled medicines. Two types are commonly used to treat asthma, depending on severity: Controller medicines. These help prevent asthma symptoms from occurring. They are taken every day. Fast-acting reliever or rescue medicines. These quickly relieve asthma symptoms. They are used as needed and provide short-term relief. Using other medicines, such as: Allergy medicines, such as antihistamines, if your asthma attacks are triggered by allergens. Immune medicines (immunomodulators). These are medicines that help control the immune system. Using supplemental oxygen. This is only needed during a severe episode. Creating an asthma action plan. An asthma action plan is a written plan for managing and treating your asthma attacks. This plan includes: A list of your asthma triggers and how to avoid them. Information about when medicines should be taken and when their dosage should be changed. Instructions about using a device called a peak flow meter. A peak flow meter measures how well the lungs are working and the severity of your asthma. It helps you monitor your  condition. Follow these instructions at home: Take  over-the-counter and prescription medicines only as told by your health care provider. Stay up to date on all vaccinations as recommended by your healthcare provider, including vaccines for the flu and pneumonia. Use a peak flow meter and keep track of your peak flow readings. Understand and use your asthma action plan to address any asthma flares. Do not smoke or allow anyone to smoke in your home. Contact a health care provider if: You have wheezing, shortness of breath, or a cough that is not responding to medicines. Your medicines are causing side effects, such as a rash, itching, swelling, or trouble breathing. You need to use a reliever medicine more than 2-3 times a week. Your peak flow reading is still at 50-79% of your personal best after following your action plan for 1 hour. You have a fever and shortness of breath. Get help right away if: You are getting worse and do not respond to treatment during an asthma attack. You are short of breath when at rest or when doing very little physical activity. You have difficulty eating, drinking, or talking. You have chest pain or tightness. You develop a fast heartbeat or palpitations. You have a bluish color to your lips or fingernails. You are light-headed or dizzy, or you faint. Your peak flow reading is less than 50% of your personal best. You feel too tired to breathe normally. These symptoms may be an emergency. Get help right away. Call 911. Do not wait to see if the symptoms will go away. Do not drive yourself to the hospital. Summary Asthma is a long-term (chronic) condition that causes recurrent episodes in which the airways become tight and narrow. Asthma episodes, also called asthma attacks or asthma flares, can cause coughing, wheezing, shortness of breath, and chest pain. Asthma cannot be cured, but medicines and lifestyle changes can help keep it well controlled and prevent asthma flares. Make sure you understand how to  avoid triggers and how and when to use your medicines. Asthma attacks can range from minor to life-threatening. Get help right away if you have an asthma attack and do not respond to treatment with your usual rescue medicines. This information is not intended to replace advice given to you by your health care provider. Make sure you discuss any questions you have with your health care provider. Document Revised: 01/08/2021 Document Reviewed: 12/30/2020 Elsevier Patient Education  [place].

## 2022-05-02 ENCOUNTER — Other Ambulatory Visit: Payer: Self-pay | Admitting: Nurse Practitioner

## 2022-05-05 DIAGNOSIS — M25571 Pain in right ankle and joints of right foot: Secondary | ICD-10-CM | POA: Diagnosis not present

## 2022-05-13 NOTE — Progress Notes (Signed)
Assessment and Plan:  Gerald [name] was seen today for follow-up.  Diagnoses and all orders for this visit:  Mild intermittent asthma with acute exacerbation -     Ambulatory referral to Pulmonology -     montelukast (SINGULAIR) 10 MG tablet; Take 1 tablet (10 mg total) by mouth daily. -     fluticasone (FLONASE) 50 MCG/ACT nasal spray; Place 1 spray into both nostrils daily.  Environmental allergies Change from generic Zyrtec to generic Allegra daily Push fluids -     Ambulatory referral to Pulmonology -     montelukast (SINGULAIR) 10 MG tablet; Take 1 tablet (10 mg total) by mouth daily. -     fluticasone (FLONASE) 50 MCG/ACT nasal spray; Place 1 spray into both nostrils daily.       Further disposition pending results of labs. Discussed med's effects and SE's.   Over 30 minutes of exam, counseling, chart review, and critical decision making was performed.   Future Appointments  Date Time Provider [place]  06/25/2022  2:00 PM Unk [name], MD [facility] None  09/09/2022 10:20 AM Berniece Salines, DO [facility] None  09/29/2022 11:00 AM Alycia Rossetti, NP [facility] None    ------------------------------------------------------------------------------------------------------------------   HPI BP 118/62   Pulse (!) 57   Temp 97.9 F (36.6 C)   Ht 5' 8"$  (1.727 m)   Wt 193 lb 12.8 oz (87.9 kg)   SpO2 96%   BMI 29.47 kg/m    67 y.o.male presents for reevaluation of lungs functioning. Continues to cough frequently throughout the day. Occ will get yellow thick mucus if coughing intensely. He does take generic Zyrtec but has taken this for many years.    BP is well controlled without medication.  Denies headaches, chest pain and dizziness.  BP Readings from Last 3 Encounters:  05/15/22 118/62  05/01/22 120/72  04/15/22 130/86    BMI is Body mass index is 29.47 kg/m., he has been working on diet and exercise. Wt Readings from Last 3 Encounters:  05/15/22 193 lb  12.8 oz (87.9 kg)  05/01/22 194 lb (88 kg)  04/15/22 198 lb 3.2 oz (89.9 kg)     Past Medical History:  Diagnosis Date   Acute medial meniscus tear of right knee 02/07/2021   Headache    Hyperlipidemia    Hypertension    Pre-diabetes    no meds   Vitamin D deficiency      No Known Allergies  Current Outpatient Medications on File Prior to Visit  Medication Sig   albuterol (VENTOLIN HFA) 108 (90 Base) MCG/ACT inhaler Inhale 2 puffs into the lungs every 6 (six) hours as needed for wheezing or shortness of breath.   ALPRAZolam (XANAX) 0.5 MG tablet TAKE 1/2-1 TAB 2-3X DAILY IF NEEDED FOR ANXIETY ATTACK (MAX 5 DAYS/WEEK TO AVOID ADDICTION/DEMENTIA)   Ascorbic Acid (VITAMIN C) 1000 MG tablet Take 1,000 mg by mouth 2 (two) times daily.   aspirin EC 81 MG tablet Take 81 mg by mouth daily. Swallow whole.   cetirizine (ZYRTEC) 10 MG tablet Take by mouth.   Cholecalciferol (VITAMIN D-3) 5000 UNITS TABS Take 5,000 Units by mouth daily.    clobetasol cream (TEMOVATE) 0.05 % APPLY TO RASH TWICE DAILY AS NEEDED   [employer]S STOOL SOFTENER/LAXATIVE 8.6-50 MG tablet Take 1 tablet by mouth 2 (two) times daily.   Flaxseed, Linseed, (FLAX SEED OIL PO) Take 1,000 mg by mouth 3 (three) times daily.    Glucosamine-Chondroitin (GLUCOSAMINE CHONDR COMPLEX PO) Take by mouth.  Take 2 tablets daily   guaiFENesin (MUCINEX PO) Take by mouth.   Magnesium 500 MG TABS Take 1 tablet (500 mg total) by mouth daily.   Melatonin 10 MG CAPS Take by mouth Nightly.   moexipril (UNIVASC) 15 MG tablet TAKE 1 TABLET (15 MG TOTAL) BY MOUTH DAILY. FOR BLOOD PRESSURE   Omega-3 Fatty Acids (FISH OIL PO) Take 1,000 mg by mouth 3 (three) times daily.    pregabalin (LYRICA) 200 MG capsule Take 1 capsule 2 x /day   Probiotic Product (ALIGN) 4 MG CAPS Take 1 capsule daily for Probiotic Benefit   rosuvastatin (CRESTOR) 40 MG tablet TAKE 1 TABLET BY MOUTH EVERY DAY FOR CHOLESTEROL   tamsulosin (FLOMAX) 0.4 MG CAPS capsule Take 0.4 mg by  mouth.   Testosterone 10 MG/ACT (2%) GEL APPLY THE CONTENTS OF 4    PUMP ACTUATIONS DAILY TO   THIGHS   vitamin B-12 (CYANOCOBALAMIN) 100 MCG tablet Take 100 mcg by mouth daily.   zolmitriptan (ZOMIG) 5 MG tablet TAKE ONE TABLET BY MOUTH AT ONSET OF HEADACHE; MAY REPEAT ONE TABLET IN 2 HOURS IF NEEDED.   Fluticasone-Umeclidin-Vilant (TRELEGY ELLIPTA) 100-62.5-25 MCG/ACT AEPB Inhale 100 mcg into the lungs daily. (Patient not taking: Reported on 05/15/2022)   ibuprofen (ADVIL) 600 MG tablet Take 1 tablet (600 mg total) by mouth every 6 (six) hours as needed. (Patient not taking: Reported on 05/15/2022)   No current facility-administered medications on file prior to visit.    ROS: all negative except above.   Physical Exam:  BP 118/62   Pulse (!) 57   Temp 97.9 F (36.6 C)   Ht 5' 8"$  (1.727 m)   Wt 193 lb 12.8 oz (87.9 kg)   SpO2 96%   BMI 29.47 kg/m   General Appearance: Well nourished, in no apparent distress. Eyes: PERRLA, EOMs, conjunctiva no swelling or erythema Sinuses: No Frontal/maxillary tenderness ENT/Mouth: Ext aud canals clear, TMs without erythema, bulging. No erythema, swelling, or exudate on post pharynx.  Tonsils not swollen or erythematous. Hearing normal.  Neck: Supple, thyroid normal.  Respiratory: Respiratory effort normal, BS equal bilaterally without rales, rhonchi, wheezing or stridor.  Cardio: RRR with no MRGs. Brisk peripheral pulses without edema.  Abdomen: Soft, + BS.  Non tender, no guarding, rebound, hernias, masses. Lymphatics: Non tender without lymphadenopathy.  Musculoskeletal: Full ROM, 5/5 strength, normal gait.  Skin: Warm, dry without rashes, lesions, ecchymosis.  Neuro: Cranial nerves intact. Normal muscle tone, no cerebellar symptoms. Sensation intact.  Psych: Awake and oriented X 3, normal affect, Insight and Judgment appropriate.     Alycia Rossetti, NP 10:53 AM [facility]

## 2022-05-15 ENCOUNTER — Ambulatory Visit (INDEPENDENT_AMBULATORY_CARE_PROVIDER_SITE_OTHER): Payer: Medicare HMO | Admitting: Nurse Practitioner

## 2022-05-15 ENCOUNTER — Encounter: Payer: Self-pay | Admitting: Nurse Practitioner

## 2022-05-15 VITALS — BP 118/62 | HR 57 | Temp 97.9°F | Ht 68.0 in | Wt 193.8 lb

## 2022-05-15 DIAGNOSIS — Z9109 Other allergy status, other than to drugs and biological substances: Secondary | ICD-10-CM

## 2022-05-15 DIAGNOSIS — J4521 Mild intermittent asthma with (acute) exacerbation: Secondary | ICD-10-CM | POA: Diagnosis not present

## 2022-05-15 MED ORDER — FLUTICASONE PROPIONATE 50 MCG/ACT NA SUSP: 1.0000 | Freq: Every day | NASAL | 2 refills | Status: DC

## 2022-05-15 MED ORDER — MONTELUKAST SODIUM 10 MG PO TABS: 10.0000 mg | ORAL_TABLET | Freq: Every day | ORAL | 2 refills | Status: DC

## 2022-05-15 NOTE — Patient Instructions (Signed)
Stop generic Zyrtec and begin generic Allegra daily  Begin Flonase 2 sprays each nostril daily  Begin Singulair 1 tab at bedtime daily  Referral has been placed to pulmonology   Asthma, Adult  Asthma is a long-term (chronic) condition that causes recurrent episodes in which the lower airways in the lungs become tight and narrow. The narrowing is caused by inflammation and tightening of the smooth muscle around the lower airways. Asthma episodes, also called asthma attacks or asthma flares, may cause coughing, making high-pitched whistling sounds when you breathe, most often when you breathe out (wheezing), shortness of breath, and chest pain. The airways may produce extra mucus caused by the inflammation and irritation. During an attack, it can be difficult to breathe. Asthma attacks can range from minor to life-threatening. Asthma cannot be cured, but medicines and lifestyle changes can help control it and treat acute attacks. It is important to keep your asthma well controlled so the condition does not interfere with your daily life. What are the causes? This condition is believed to be caused by inherited (genetic) and environmental factors, but its exact cause is not known. What can trigger an asthma attack? Many things can bring on an asthma attack or make symptoms worse. These triggers are different for every person. Common triggers include: Allergens and irritants like mold, dust, pet dander, cockroaches, pollen, air pollution, and chemical odors. Cigarette smoke. Weather changes and cold air. Stress and strong emotional responses such as crying or laughing hard. Certain medications such as aspirin or beta blockers. Infections and inflammatory conditions, such as the flu, a cold, pneumonia, or inflammation of the nasal membranes (rhinitis). Gastroesophageal reflux disease (GERD). What are the signs or symptoms? Symptoms may occur right after exposure to an asthma trigger or hours  later and can vary by person. Common signs and symptoms include: Wheezing. Trouble breathing (shortness of breath). Excessive nighttime or early morning coughing. Chest tightness. Tiredness (fatigue) with minimal activity. Difficulty talking in complete sentences. Poor exercise tolerance. How is this diagnosed? This condition is diagnosed based on: A physical exam and your medical history. Tests, which may include: Lung function studies to evaluate the flow of air in your lungs. Allergy tests. Imaging tests, such as X-rays. How is this treated? There is no cure, but symptoms can be controlled with proper treatment. Treatment usually involves: Identifying and avoiding your asthma triggers. Inhaled medicines. Two types are commonly used to treat asthma, depending on severity: Controller medicines. These help prevent asthma symptoms from occurring. They are taken every day. Fast-acting reliever or rescue medicines. These quickly relieve asthma symptoms. They are used as needed and provide short-term relief. Using other medicines, such as: Allergy medicines, such as antihistamines, if your asthma attacks are triggered by allergens. Immune medicines (immunomodulators). These are medicines that help control the immune system. Using supplemental oxygen. This is only needed during a severe episode. Creating an asthma action plan. An asthma action plan is a written plan for managing and treating your asthma attacks. This plan includes: A list of your asthma triggers and how to avoid them. Information about when medicines should be taken and when their dosage should be changed. Instructions about using a device called a peak flow meter. A peak flow meter measures how well the lungs are working and the severity of your asthma. It helps you monitor your condition. Follow these instructions at home: Take over-the-counter and prescription medicines only as told by your health care provider. Stay up  to date on  all vaccinations as recommended by your healthcare provider, including vaccines for the flu and pneumonia. Use a peak flow meter and keep track of your peak flow readings. Understand and use your asthma action plan to address any asthma flares. Do not smoke or allow anyone to smoke in your home. Contact a health care provider if: You have wheezing, shortness of breath, or a cough that is not responding to medicines. Your medicines are causing side effects, such as a rash, itching, swelling, or trouble breathing. You need to use a reliever medicine more than 2-3 times a week. Your peak flow reading is still at 50-79% of your personal best after following your action plan for 1 hour. You have a fever and shortness of breath. Get help right away if: You are getting worse and do not respond to treatment during an asthma attack. You are short of breath when at rest or when doing very little physical activity. You have difficulty eating, drinking, or talking. You have chest pain or tightness. You develop a fast heartbeat or palpitations. You have a bluish color to your lips or fingernails. You are light-headed or dizzy, or you faint. Your peak flow reading is less than 50% of your personal best. You feel too tired to breathe normally. These symptoms may be an emergency. Get help right away. Call 911. Do not wait to see if the symptoms will go away. Do not drive yourself to the hospital. Summary Asthma is a long-term (chronic) condition that causes recurrent episodes in which the airways become tight and narrow. Asthma episodes, also called asthma attacks or asthma flares, can cause coughing, wheezing, shortness of breath, and chest pain. Asthma cannot be cured, but medicines and lifestyle changes can help keep it well controlled and prevent asthma flares. Make sure you understand how to avoid triggers and how and when to use your medicines. Asthma attacks can range from minor to  life-threatening. Get help right away if you have an asthma attack and do not respond to treatment with your usual rescue medicines. This information is not intended to replace advice given to you by your health care provider. Make sure you discuss any questions you have with your health care provider. Document Revised: 01/08/2021 Document Reviewed: 12/30/2020 Elsevier Patient Education  [place].

## 2022-05-19 DIAGNOSIS — M25571 Pain in right ankle and joints of right foot: Secondary | ICD-10-CM | POA: Diagnosis not present

## 2022-05-20 DIAGNOSIS — R399 Unspecified symptoms and signs involving the genitourinary system: Secondary | ICD-10-CM | POA: Diagnosis not present

## 2022-05-20 DIAGNOSIS — R3914 Feeling of incomplete bladder emptying: Secondary | ICD-10-CM | POA: Diagnosis not present

## 2022-05-20 DIAGNOSIS — N401 Enlarged prostate with lower urinary tract symptoms: Secondary | ICD-10-CM | POA: Diagnosis not present

## 2022-05-20 DIAGNOSIS — Z79899 Other long term (current) drug therapy: Secondary | ICD-10-CM | POA: Diagnosis not present

## 2022-05-21 ENCOUNTER — Encounter: Payer: Medicare HMO | Admitting: Internal Medicine

## 2022-05-22 DIAGNOSIS — N401 Enlarged prostate with lower urinary tract symptoms: Secondary | ICD-10-CM | POA: Diagnosis not present

## 2022-05-22 DIAGNOSIS — N138 Other obstructive and reflux uropathy: Secondary | ICD-10-CM | POA: Diagnosis not present

## 2022-05-26 ENCOUNTER — Encounter: Payer: Self-pay | Admitting: Internal Medicine

## 2022-06-11 DIAGNOSIS — M25571 Pain in right ankle and joints of right foot: Secondary | ICD-10-CM | POA: Diagnosis not present

## 2022-06-12 DIAGNOSIS — R399 Unspecified symptoms and signs involving the genitourinary system: Secondary | ICD-10-CM | POA: Diagnosis not present

## 2022-06-12 DIAGNOSIS — R339 Retention of urine, unspecified: Secondary | ICD-10-CM | POA: Diagnosis not present

## 2022-06-15 ENCOUNTER — Ambulatory Visit: Payer: Medicare HMO | Admitting: Pulmonary Disease

## 2022-06-15 ENCOUNTER — Encounter: Payer: Self-pay | Admitting: Pulmonary Disease

## 2022-06-15 VITALS — BP 122/68 | HR 73 | Ht 68.0 in | Wt 197.2 lb

## 2022-06-15 DIAGNOSIS — J4540 Moderate persistent asthma, uncomplicated: Secondary | ICD-10-CM | POA: Diagnosis not present

## 2022-06-15 MED ORDER — FLUTICASONE-SALMETEROL 500-50 MCG/ACT IN AEPB: 1.0000 | INHALATION_SPRAY | Freq: Two times a day (BID) | RESPIRATORY_TRACT | 3 refills | Status: DC

## 2022-06-15 NOTE — Patient Instructions (Addendum)
Nice to meet you   It sounds like to me cat hair/dander, exposure while cleaning up has likely triggered and reactivated asthma  I recommend using Advair 1 puff twice a day, morning and evenings, every day.  Rinse your mouth out with water and spit after every use.  Please send me a message if you find this to be too expensive.  Please stop the medication and send a message if you start to experience unusual side effects or sensations related to the start of the medicine.  I do not anticipate this being the case but just let me know.  Send me a message in a few weeks if things are not getting better, if I do not hear from you I will assume things are improving.  Return to clinic in 3 months or sooner if needed with Dr. Silas Flood

## 2022-06-15 NOTE — Progress Notes (Signed)
$'@Patient'V$  ID: Gerald [name], male    DOB: [birth year], 67 y.o.   MRN: [id number]  Chief Complaint  Patient presents with   Consult    Pt is here for consult for possible asthma flare up. Pt states no issues for 50+ years with asthma. Cleaned moms home that had 30+ cats, dust, and cockroaches. Pt states had constriction feeling in his chest and throat. Pt is on albuterol and states that it does help. Pt os on flonase and singular as well. Pt started cleaning out moms house in January.     Referring provider: Alycia Rossetti, NP  HPI:   67 y.o. man whom we are seeing for evaluation of ongoing cough and chest tightness.  Note from referring provider reviewed.  Asthma as a child.  Grew out of this about age 67 or so.  Mild symptoms with exertion and high school otherwise no issues thereafter.  Mother recently passed away.  Started cleaning out her house particularly enclosed porch in January.  And her hair.  He had allergy testing in the past and he is allergic to it.  Other dust, cockroach debris etc.  Symptoms had intermittent chest tightness, cough.  Productive of mild amount of phlegm.  Progressively slightly improved over time.  But lingering now for couple months.  Trial of prednisone with some improvement but not much.  Use albuterol with some improvement.  Uses it intermittently.  Has nasal congestion as well with this.  Sinus congestion.  Treated with Flonase, montelukast etc. with improvement in nasal congestion, rhinorrhea.  Not a major issue now.  Not on maintenance inhalers.Per my review it sounds acute on Trelegy at 1 point in time but not more.  CT coronary scan 03/2021 that showed small less than 4 mm bilateral nodule x 2, otherwise clear.  Echocardiogram 12/23  shows grade 1 diastolic function, moderately dilated LA, trivial MVR, right-sided structures and pressures reported as normal.  PMH: Hypertension, hyperlipidemia, childhood asthma, seasonal allergies, headaches Surgical  history: Sinus surgery March 1989 Family history: History of childhood asthma presents in arteriosus Social history: Never smoker, retired, lives in [place] / Pulmonary Flowsheets:   ACT:      No data to display          MMRC:     No data to display          Epworth:      No data to display          Tests:   FENO:  No results found for: "NITRICOXIDE"  PFT:     No data to display          WALK:      No data to display          Imaging: Personally reviewed and as per EMR and discussion in this note No results found.  Lab Results: Personally reviewed CBC    Component Value Date/Time   WBC 4.9 03/03/2022 1530   RBC 5.17 03/03/2022 1530   HGB 13.6 03/03/2022 1530   HCT 41.8 03/03/2022 1530   PLT 295 03/03/2022 1530   MCV 80.9 03/03/2022 1530   MCH 26.3 (L) 03/03/2022 1530   MCHC 32.5 03/03/2022 1530   RDW 14.0 03/03/2022 1530   LYMPHSABS 1,607 03/03/2022 1530   MONOABS 611 10/15/2016 1123   EOSABS 314 03/03/2022 1530   BASOSABS 39 03/03/2022 1530    BMET    Component Value Date/Time   NA 144 03/03/2022  1530   K 5.2 03/03/2022 1530   CL 104 03/03/2022 1530   CO2 30 03/03/2022 1530   GLUCOSE 90 03/03/2022 1530   BUN 19 03/03/2022 1530   CREATININE 1.02 03/03/2022 1530   CALCIUM 10.2 03/03/2022 1530   GFRNONAA 68 08/08/2020 1215   GFRAA 79 08/08/2020 1215    BNP No results found for: "BNP"  ProBNP No results found for: "PROBNP"  Specialty Problems   None   No Known Allergies  Immunization History  Administered Date(s) Administered   Influenza Inj Mdck Quad Pf 01/04/2019   Influenza Inj Mdck Quad With Preservative 12/27/2017   Influenza Split 01/08/2014   Influenza Whole 01/05/2013   Influenza, High Dose Seasonal PF 02/13/2022   Influenza,inj,Quad PF,6+ Mos 01/15/2021   Influenza-Unspecified 01/05/2015   Moderna SARS-COV2 Booster Vaccination 02/13/2022   PFIZER(Purple Top)SARS-COV-2 Vaccination  06/05/2019, 06/27/2019, 03/09/2020   PNEUMOCOCCAL CONJUGATE-20 08/22/2021   PPD Test 01/08/2014, 01/14/2015, 12/12/2015, 01/12/2017, 02/08/2018, 04/27/2019, 05/08/2020   Pfizer Covid-19 Vaccine Bivalent Booster 66yr & up 03/06/2021   Respiratory Syncytial Virus Vaccine,Recomb Aduvanted(Arexvy) 02/13/2022   Td 08/22/2021   Tdap 06/15/2008    Past Medical History:  Diagnosis Date   Acute medial meniscus tear of right knee 02/07/2021   Headache    Hyperlipidemia    Hypertension    Pre-diabetes    no meds   Vitamin D deficiency     Tobacco History: Social History   Tobacco Use  Smoking Status Never  Smokeless Tobacco Never   Counseling given: Not Answered   Continue to not smoke  Outpatient Encounter Medications as of 06/15/2022  Medication Sig   albuterol (VENTOLIN HFA) 108 (90 Base) MCG/ACT inhaler Inhale 2 puffs into the lungs every 6 (six) hours as needed for wheezing or shortness of breath.   ALPRAZolam (XANAX) 0.5 MG tablet TAKE 1/2-1 TAB 2-3X DAILY IF NEEDED FOR ANXIETY ATTACK (MAX 5 DAYS/WEEK TO AVOID ADDICTION/DEMENTIA)   Ascorbic Acid (VITAMIN C) 1000 MG tablet Take 1,000 mg by mouth 2 (two) times daily.   aspirin EC 81 MG tablet Take 81 mg by mouth daily. Swallow whole.   cetirizine (ZYRTEC) 10 MG tablet Take by mouth.   Cholecalciferol (VITAMIN D-3) 5000 UNITS TABS Take 5,000 Units by mouth daily.    clobetasol cream (TEMOVATE) 0.05 % APPLY TO RASH TWICE DAILY AS NEEDED   [employer] STOOL SOFTENER/LAXATIVE 8.6-50 MG tablet Take 1 tablet by mouth 2 (two) times daily.   Flaxseed, Linseed, (FLAX SEED OIL PO) Take 1,000 mg by mouth 3 (three) times daily.    fluticasone (FLONASE) 50 MCG/ACT nasal spray Place 1 spray into both nostrils daily.   fluticasone-salmeterol (ADVAIR) 500-50 MCG/ACT AEPB Inhale 1 puff into the lungs in the morning and at bedtime.   Glucosamine-Chondroitin (GLUCOSAMINE CHONDR COMPLEX PO) Take by mouth. Take 2 tablets daily   guaiFENesin (MUCINEX PO)  Take by mouth.   ibuprofen (ADVIL) 600 MG tablet Take 1 tablet (600 mg total) by mouth every 6 (six) hours as needed.   Magnesium 500 MG TABS Take 1 tablet (500 mg total) by mouth daily.   Melatonin 10 MG CAPS Take by mouth Nightly.   moexipril (UNIVASC) 15 MG tablet TAKE 1 TABLET (15 MG TOTAL) BY MOUTH DAILY. FOR BLOOD PRESSURE   montelukast (SINGULAIR) 10 MG tablet Take 1 tablet (10 mg total) by mouth daily.   Omega-3 Fatty Acids (FISH OIL PO) Take 1,000 mg by mouth 3 (three) times daily.    pregabalin (LYRICA) 200 MG capsule  Take 1 capsule 2 x /day   Probiotic Product (ALIGN) 4 MG CAPS Take 1 capsule daily for Probiotic Benefit   rosuvastatin (CRESTOR) 40 MG tablet TAKE 1 TABLET BY MOUTH EVERY DAY FOR CHOLESTEROL   tamsulosin (FLOMAX) 0.4 MG CAPS capsule Take 0.4 mg by mouth.   Testosterone 10 MG/ACT (2%) GEL APPLY THE CONTENTS OF 4    PUMP ACTUATIONS DAILY TO   THIGHS   vitamin B-12 (CYANOCOBALAMIN) 100 MCG tablet Take 100 mcg by mouth daily.   zolmitriptan (ZOMIG) 5 MG tablet TAKE ONE TABLET BY MOUTH AT ONSET OF HEADACHE; MAY REPEAT ONE TABLET IN 2 HOURS IF NEEDED.   [DISCONTINUED] fluticasone (FLONASE) 50 MCG/ACT nasal spray Place into the nose.   No facility-administered encounter medications on file as of 06/15/2022.     Review of Systems  Review of Systems  No chest pain with exertion.  No orthopnea or PND.  Comprehensive review of systems otherwise negative. Physical Exam  BP 122/68 (BP Location: Left Arm, Patient Position: Sitting, Cuff Size: Normal)   Pulse 73   Ht '5\' 8"'$  (1.727 m)   Wt 197 lb 3.2 oz (89.4 kg)   SpO2 97%   BMI 29.98 kg/m   Wt Readings from Last 5 Encounters:  06/15/22 197 lb 3.2 oz (89.4 kg)  05/15/22 193 lb 12.8 oz (87.9 kg)  05/01/22 194 lb (88 kg)  04/15/22 198 lb 3.2 oz (89.9 kg)  03/10/22 198 lb (89.8 kg)    BMI Readings from Last 5 Encounters:  06/15/22 29.98 kg/m  05/15/22 29.47 kg/m  05/01/22 29.50 kg/m  04/15/22 30.14 kg/m   03/10/22 30.11 kg/m     Physical Exam General: Sitting in chair, no acute distress Eyes: EOMI, no icterus Neck: Supple, no JVP Pulmonary: Clear, no work of breathing Cardiovascular: Warm, no edema Abdomen: Bowel sounds present, nondistended MSK: No synovitis, no joint effusion Neuro: Normal gait, no weakness Psych: Normal mood, full affect   Assessment & Plan:   Ongoing daily cough, intermittent tightness of chest/upper airway: Started after exposure to cat dander, other dust while cleaning to see his mother's property.  Gradually improving over time other still persistent cough and intermittent symptoms.  Exposure over time is decreasing as well.  Albuterol helps some.  High-dose Advair discus twice daily to see if improves symptoms given high suspicion for reactivation of prior asthma.  Nasal congestion: Continue current regimen via PCP, Flonase, antihistamines etc.  Pulmonary nodules: Multiple less than 6 mm, no follow-up per Fleischner criteria as patient is low risk.   Return in about 3 months (around 09/15/2022).   Lanier Clam, MD 06/15/2022   This appointment required 61 minutes of patient care (this includes precharting, chart review, review of results, face-to-face care, etc.).

## 2022-06-16 ENCOUNTER — Other Ambulatory Visit: Payer: Self-pay

## 2022-06-16 DIAGNOSIS — E782 Mixed hyperlipidemia: Secondary | ICD-10-CM | POA: Diagnosis not present

## 2022-06-16 LAB — LIPID PANEL
Chol/HDL Ratio: 2.7 ratio (ref 0.0–5.0)
Cholesterol, Total: 136 mg/dL (ref 100–199)
HDL: 51 mg/dL (ref >39)
LDL Chol Calc (NIH): 67 mg/dL (ref 0–99)
Triglycerides: 98 mg/dL (ref 0–149)
VLDL Cholesterol Cal: 18 mg/dL (ref 5–40)

## 2022-06-24 ENCOUNTER — Encounter: Payer: Self-pay | Admitting: Internal Medicine

## 2022-06-24 DIAGNOSIS — M205X2 Other deformities of toe(s) (acquired), left foot: Secondary | ICD-10-CM | POA: Diagnosis not present

## 2022-06-24 DIAGNOSIS — M205X1 Other deformities of toe(s) (acquired), right foot: Secondary | ICD-10-CM | POA: Diagnosis not present

## 2022-06-24 DIAGNOSIS — W11XXXD Fall on and from ladder, subsequent encounter: Secondary | ICD-10-CM | POA: Diagnosis not present

## 2022-06-24 DIAGNOSIS — S82251D Displaced comminuted fracture of shaft of right tibia, subsequent encounter for closed fracture with routine healing: Secondary | ICD-10-CM | POA: Diagnosis not present

## 2022-06-24 DIAGNOSIS — M205X9 Other deformities of toe(s) (acquired), unspecified foot: Secondary | ICD-10-CM | POA: Diagnosis not present

## 2022-06-24 DIAGNOSIS — M19071 Primary osteoarthritis, right ankle and foot: Secondary | ICD-10-CM | POA: Diagnosis not present

## 2022-06-24 DIAGNOSIS — X58XXXD Exposure to other specified factors, subsequent encounter: Secondary | ICD-10-CM | POA: Diagnosis not present

## 2022-06-24 DIAGNOSIS — S82461D Displaced segmental fracture of shaft of right fibula, subsequent encounter for closed fracture with routine healing: Secondary | ICD-10-CM | POA: Diagnosis not present

## 2022-06-24 NOTE — Progress Notes (Signed)
Annual  Screening/Preventative Visit  & Comprehensive Evaluation & Examination  Future Appointments  Date Time Provider Department  06/25/2022  2:00 PM Unk [name], MD [facility]  09/09/2022 10:20 AM Berniece Salines, DO [facility]  09/29/2022 11:00 AM Alycia Rossetti, NP [facility]  07/01/2023  2:00 PM Unk [name], MD [facility]         This very nice 67 y.o.  [name] presents for a Screening /Preventative Visit & comprehensive evaluation and management of multiple medical co-morbidities.  Patient has been followed for HTN, HLD, Prediabetes and Vitamin D Deficiency.        Patient relates ongoing minimally productivre coughsince mid January after exposure to "cat" of which he had hx/o allergy to in the past.         HTN predates since  1999. Patient's BP has been controlled at home.  Today's BP was  at goal - 108/62 .  In 2008,  patient had a Negative Heart Cath after a false (+) Myoview. Patient denies any cardiac symptoms as chest pain, palpitations, shortness of breath, dizziness or ankle swelling.        Patient's hyperlipidemia is controlled with diet and Rosuvastatin. Patient denies myalgias or other medication SE's. Last lipids were at goal :  Lab Results  Component Value Date   CHOL 136 06/16/2022   HDL 51 06/16/2022   LDLCALC 67 06/16/2022   TRIG 98 06/16/2022   CHOLHDL 2.7 06/16/2022         Patient has hx/o prediabetes (A1c 5.7% /2011) and patient denies reactive hypoglycemic symptoms, visual blurring, diabetic polys or paresthesias. Last A1c was near goal :   Lab Results  Component Value Date   HGBA1C 5.7 (H) 11/24/2021                                             Patient has hx/o Testosterone Deficiency (2005) and  is on gel  replacement with improved stamina & sense of well being.          Finally, patient has history of Vitamin D Deficiency ("48"on Tx/2008 ) and last vitamin D was at goal :    Lab Results  Component Value Date   VD25OH 94  11/24/2021       Current Outpatient Medications on File Prior to Visit  Medication Sig   ALPRAZolam 0.5 MG tablet TAKE 1/2-1 TAB 2-3X DAILY IF NEEDED    VITAMIN C 1000 MG tablet Take 2 times daily.   aspirin EC 81 MG tablet Take daily.    FIORICET  tablet TAKE 1 TAB EVERY 4 TO 6 HRS ONLY IF NEEDED    VITAMIN D 5000 u Take 5,000 Units daily.    cyclobenzaprine  10 MG tablet Take 10 mg by mouth as needed for muscle spasms.   FLAX SEED OIL  1,000 mg  Take3 (three) times daily.    Glucosamine-Chondroitin  Take 2 tablets daily   NORCO  5-325 MG tablet Take 1 tablet  every 4 hours as needed for moderate pain.   ibuprofen  600 MG tablet Take 1 tablet  every 6 hours as needed.   Magnesium 500 MG TABS Take 1 tablet daily.   Melatonin 10 MG CAPS Take  Nightly.   UNIVASC  15 MG tablet TAKE 1 TABLET DAILY   FISH OIL  1,000 mg  Take  3  times daily.    pregabalin  200 MG capsule Take 1 capsule 2 x /day   ALIGN 4 MG CAPS Take 1 capsule daily    rosuvastatin  40 MG tablet Take 1 tablet  daily.   SUMAtriptan (ONZETRA XSAIL NA) Place 1 application into the nose See admin instructions. Once as needed with onset of migraine, up to twice per episode, 2 hours apart.   tadalafil  5 MG tablet    TAZORAC 0.05 % cream    Testosterone 10 MG/ACT (2%) GEL  4    PUMP ACTUATIONS DAILY    vitamin B-12  100 MCG tablet Take 100 mcg  daily.   ZOMIG 5 MG tablet 1 tab with onset of migraine, repeat in 2 hours if needed.     No Known Allergies   Past Medical History:  Diagnosis Date   Acute medial meniscus tear of right knee 02/07/2021   Headache    Hyperlipidemia    Hypertension    Pre-diabetes    no meds   Vitamin D deficiency      Health Maintenance  Topic Date Due   HIV Screening  Never done   Hepatitis C Screening  Never done   Zoster Vaccines- Shingrix (1 of 2) Never done   TETANUS/TDAP  06/16/2018   Pneumonia Vaccine 12+ Years old (1 - PCV) 05/10/2021   COLONOSCOPY  08/01/2024   INFLUENZA  VACCINE  Completed   COVID-19 Vaccine  Completed   HPV VACCINES  Aged Out     Immunization History  Administered Date(s) Administered   Influenza Inj Mdck Quad  01/04/2019   Influenza Inj Mdck Quad  12/27/2017   Influenza Split 01/08/2014   Influenza Whole 01/05/2013   Influenza,inj,Quad PF,6+ Mos 01/15/2021   Influenza 01/05/2015   PFIZER SARS-COV-2 Vacc 06/05/2019, 06/27/2019, 03/09/2020   PPD Test 02/08/2018, 04/27/2019, 05/08/2020   Pfizer Covid-19 Bivalent Booster  03/06/2021   Tdap 06/15/2008    Last Colon -   08/02/2014 - Dr Fuller Plan -  Recc 10 year f/u - apr 2026   Past Surgical History:  Procedure Laterality Date   ESOPHAGOGASTRODUODENOSCOPY     KNEE ARTHROSCOPY WITH MEDIAL MENISECTOMY Right 02/20/2021   Procedure: Right KNEE ARTHROSCOPY WITH MEDIAL MENISECTOMY; Elsie Saas, MD   KNEE SURGERY     left   ORIF ACETABULAR FRACTURE     left arm 2 times     Family History  Problem Relation Age of Onset   Hypertension Mother    Hyperlipidemia Mother    Diabetes Mother    Hypertension Father      Social History   Tobacco Use   Smoking status: Never   Smokeless tobacco: Never  Vaping Use   Vaping Use: Never used  Substance Use Topics   Alcohol use: Yes    Alcohol/week: 2.0 - 5.0 standard drinks    Types: 2 - 5 Standard drinks or equivalent per week   Drug use: No      ROS Constitutional: Denies fever, chills, weight loss/gain, headaches, insomnia,  night sweats or change in appetite. Does c/o fatigue. Eyes: Denies redness, blurred vision, diplopia, discharge, itchy or watery eyes.  ENT: Denies discharge, epistaxis, sore throat, earache, hearing loss, dental pain, Tinnitus, Vertigo  or snoring.  Cardio: Denies chest pain, palpitations, irregular heartbeat, syncope, dyspnea, diaphoresis, orthopnea, PND, claudication or edema Respiratory: denies cough, dyspnea, DOE, pleurisy, hoarseness, laryngitis or wheezing.  Gastrointestinal: Denies dysphagia,  heartburn, reflux, water brash, pain, cramps, nausea, vomiting, bloating, diarrhea, constipation,  hematemesis, melena, hematochezia, jaundice or hemorrhoids Genitourinary: Denies dysuria, frequency, urgency, nocturia, hesitancy, discharge, hematuria or flank pain Musculoskeletal: Denies arthralgia, myalgia, stiffness, Jt. Swelling, pain, limp or strain/sprain. Denies Falls. Skin: Denies puritis, rash, hives, warts, acne, eczema or change in skin lesion Neuro: No weakness, tremor, incoordination, spasms, paresthesia or pain Psychiatric: Denies confusion, memory loss or sensory loss. Denies Depression. Endocrine: Denies change in weight, skin, hair change, nocturia, and paresthesia, diabetic polys, visual blurring or hyper / hypo glycemic episodes.  Heme/Lymph: No excessive bleeding, bruising or enlarged lymph nodes.   Physical Exam  BP 108/62   Pulse 77   Temp 97.9 F (36.6 C)   Resp 16   Ht 5\' 8"  (1.727 m)   Wt 193 lb 12.8 oz (87.9 kg)   SpO2 96%   BMI 29.47 kg/m   General Appearance: Well nourished and well groomed and in no apparent distress.  Eyes: PERRLA, EOMs, conjunctiva no swelling or erythema, normal fundi and vessels. Sinuses:  (+) maxillary tenderness ENT/Mouth: EACs patent / TMs  nl. Nares clear without erythema, swelling, mucoid exudates. Oral hygiene is good. No erythema, swelling, or exudate. Tongue normal, non-obstructing. Tonsils not swollen or erythematous. Hearing normal.  Neck: Supple, thyroid not palpable. No bruits, nodes or JVD. Respiratory: Respiratory effort normal.  BS equal and clear bilateral without rales, rhonci, wheezing or stridor. Cardio: Heart sounds are normal with regular rate and rhythm and no murmurs, rubs or gallops. Peripheral pulses are normal and equal bilaterally without edema. No aortic or femoral bruits. Chest:  Few scattered rales clear with cough.    Abdomen: Soft, with Nl bowel sounds. Nontender, no guarding, rebound, hernias, masses, or  organomegaly.  Lymphatics: Non tender without lymphadenopathy.  Musculoskeletal: Full ROM all peripheral extremities, joint stability, 5/5 strength, and normal gait. Skin: Warm and dry without rashes, lesions, cyanosis, clubbing or  ecchymosis.  Neuro: Cranial nerves intact, reflexes equal bilaterally. Normal muscle tone, no cerebellar symptoms. Sensation intact.  Pysch: Alert and oriented x 3 with normal affect, insight and judgment appropriate.   Assessment and Plan  1. Annual Preventative/Screening Exam    2. Essential hypertension  - EKG 12-Lead - [place], RETROPERITNL ABD,  LTD - Urinalysis, Routine w reflex microscopic - Microalbumin / creatinine urine ratio - CBC with Differential/Platelet - COMPLETE METABOLIC PANEL WITH GFR - Magnesium - TSH  3. Hyperlipidemia, mixed  - EKG 12-Lead - [place], RETROPERITNL ABD,  LTD - Lipid panel  - rosuvastatin  20 MG tablet;  Take  1 tablet   Daily  for Cholesterol   Dispense: 90 tablet; Refill: 3  4. Abnormal glucose  - EKG 12-Lead - [place], RETROPERITNL ABD,  LTD - Hemoglobin A1c - Insulin, random  5. Vitamin D deficiency  - VITAMIN D 25 Hydroxy (V 6. Prediabetes  - EKG 12-Lead - [place], RETROPERITNL ABD,  LTD - Hemoglobin A1c - Insulin, random  7. Testosterone Deficiency  - Testosterone  8. Cough headache  - POC COVID-19  9. Screening for colorectal cancer  - POC Hemoccult Bld/Stl  10. BPH with obstruction/lower urinary tract symptoms  - PSA  11. Prostate cancer screening  - PSA  12. Screening for ischemic heart disease  - EKG 12-Lead  13. FH: hypertension  - EKG 12-Lead - [place], RETROPERITNL ABD,  LTD  14. Screening for AAA (aortic abdominal aneurysm)  - [place], RETROPERITNL ABD,  LTD  15. Medication management  - Urinalysis, Routine w reflex microscopic - Microalbumin / creatinine urine ratio - Testosterone -  CBC with Differential/Platelet - COMPLETE METABOLIC PANEL WITH GFR - Magnesium - Lipid panel -  TSH - Hemoglobin A1c - Insulin, random - VITAMIN D 25 Hydroxy (Vit-D Deficiency, Fractures)  16. Testosterone deficiency  - Testosterone 10 MG/ACT (2%) GEL; APPLY THE CONTENTS OF 4    PUMP ACTUATIONS DAILY TO   THIGHS  Dispense: 180 g; Refill: 1  17. Subacute maxillary sinusitis  - dexamethasone  4 MG tablet; Take 1 tab 3 x day - 3 days, then 2 x day - 3 days, then 1 tab daily  Dispense: 20 tablet; Refill: 0 - promethazine--DM) 6.25-15 MG/5ML syrup;  Take 1 tsp every 4 hours if needed for cough   Dispense: 240 mL; Refill: 1  - pseudoephedrine  120 MG 12 hr tablet;  Take  1 tablet  2 x /day (every 12 hours)   Dispense: 60 tablet           Patient was counseled in prudent diet, weight control to achieve/maintain BMI less than 25, BP monitoring, regular exercise and medications as discussed.  Discussed med effects and SE's. Routine screening labs and tests as requested with regular follow-up as recommended. Over 40 minutes of exam, counseling, chart review and high complex critical decision making was performed   Kirtland Bouchard, MD

## 2022-06-25 ENCOUNTER — Ambulatory Visit (INDEPENDENT_AMBULATORY_CARE_PROVIDER_SITE_OTHER): Payer: Medicare HMO | Admitting: Internal Medicine

## 2022-06-25 ENCOUNTER — Encounter: Payer: Self-pay | Admitting: Internal Medicine

## 2022-06-25 ENCOUNTER — Ambulatory Visit
Admission: RE | Admit: 2022-06-25 | Discharge: 2022-06-25 | Disposition: A | Discharge status: Home / Self Care | Payer: Medicare HMO | Source: Ambulatory Visit | Attending: Internal Medicine | Admitting: Internal Medicine

## 2022-06-25 VITALS — BP 108/62 | HR 77 | Temp 97.9°F | Resp 16 | Ht 68.0 in | Wt 193.8 lb

## 2022-06-25 DIAGNOSIS — E782 Mixed hyperlipidemia: Secondary | ICD-10-CM | POA: Diagnosis not present

## 2022-06-25 DIAGNOSIS — Z125 Encounter for screening for malignant neoplasm of prostate: Secondary | ICD-10-CM

## 2022-06-25 DIAGNOSIS — E559 Vitamin D deficiency, unspecified: Secondary | ICD-10-CM

## 2022-06-25 DIAGNOSIS — N401 Enlarged prostate with lower urinary tract symptoms: Secondary | ICD-10-CM | POA: Diagnosis not present

## 2022-06-25 DIAGNOSIS — I700 Atherosclerosis of aorta: Secondary | ICD-10-CM | POA: Diagnosis not present

## 2022-06-25 DIAGNOSIS — N138 Other obstructive and reflux uropathy: Secondary | ICD-10-CM | POA: Diagnosis not present

## 2022-06-25 DIAGNOSIS — R059 Cough, unspecified: Secondary | ICD-10-CM | POA: Diagnosis not present

## 2022-06-25 DIAGNOSIS — E291 Testicular hypofunction: Secondary | ICD-10-CM | POA: Diagnosis not present

## 2022-06-25 DIAGNOSIS — R7309 Other abnormal glucose: Secondary | ICD-10-CM

## 2022-06-25 DIAGNOSIS — Z79899 Other long term (current) drug therapy: Secondary | ICD-10-CM

## 2022-06-25 DIAGNOSIS — E349 Endocrine disorder, unspecified: Secondary | ICD-10-CM

## 2022-06-25 DIAGNOSIS — I10 Essential (primary) hypertension: Secondary | ICD-10-CM

## 2022-06-25 DIAGNOSIS — Z136 Encounter for screening for cardiovascular disorders: Secondary | ICD-10-CM

## 2022-06-25 DIAGNOSIS — Z0001 Encounter for general adult medical examination with abnormal findings: Secondary | ICD-10-CM

## 2022-06-25 DIAGNOSIS — Z8249 Family history of ischemic heart disease and other diseases of the circulatory system: Secondary | ICD-10-CM

## 2022-06-25 DIAGNOSIS — R052 Subacute cough: Secondary | ICD-10-CM

## 2022-06-25 DIAGNOSIS — Z0000 Encounter for general adult medical examination without abnormal findings: Secondary | ICD-10-CM

## 2022-06-25 DIAGNOSIS — Z1211 Encounter for screening for malignant neoplasm of colon: Secondary | ICD-10-CM

## 2022-06-25 DIAGNOSIS — G43809 Other migraine, not intractable, without status migrainosus: Secondary | ICD-10-CM

## 2022-06-25 MED ORDER — DEXAMETHASONE 4 MG PO TABS: ORAL_TABLET | ORAL | 0 refills | Status: DC

## 2022-06-25 NOTE — Patient Instructions (Signed)

## 2022-06-26 LAB — CBC WITH DIFFERENTIAL/PLATELET
Absolute Monocytes: 590 cells/uL (ref 200–950)
Basophils Absolute: 44 cells/uL (ref 0–200)
Basophils Relative: 0.5 %
Eosinophils Absolute: 123 cells/uL (ref 15–500)
Eosinophils Relative: 1.4 %
HCT: 42.4 % (ref 38.5–50.0)
Hemoglobin: 13.5 g/dL (ref 13.2–17.1)
Lymphs Abs: 1109 cells/uL (ref 850–3900)
MCH: 24.9 pg — ABNORMAL LOW (ref 27.0–33.0)
MCHC: 31.8 g/dL — ABNORMAL LOW (ref 32.0–36.0)
MCV: 78.2 fL — ABNORMAL LOW (ref 80.0–100.0)
MPV: 9.9 fL (ref 7.5–12.5)
Monocytes Relative: 6.7 %
Neutro Abs: 6934 cells/uL (ref 1500–7800)
Neutrophils Relative %: 78.8 %
Platelets: 315 10*3/uL (ref 140–400)
RBC: 5.42 10*6/uL (ref 4.20–5.80)
RDW: 16.1 % — ABNORMAL HIGH (ref 11.0–15.0)
Total Lymphocyte: 12.6 %
WBC: 8.8 10*3/uL (ref 3.8–10.8)

## 2022-06-26 LAB — URINALYSIS, ROUTINE W REFLEX MICROSCOPIC
Bilirubin Urine: NEGATIVE
Glucose, UA: NEGATIVE
Hgb urine dipstick: NEGATIVE
Hyaline Cast: NONE SEEN /LPF
Ketones, ur: NEGATIVE
Leukocytes,Ua: NEGATIVE
Nitrite: NEGATIVE
RBC / HPF: NONE SEEN /HPF (ref 0–2)
Specific Gravity, Urine: 1.018 (ref 1.001–1.035)
Squamous Epithelial / HPF: NONE SEEN /HPF (ref <=5)
pH: 6 (ref 5.0–8.0)

## 2022-06-26 LAB — COMPLETE METABOLIC PANEL WITH GFR
AG Ratio: 2 (calc) (ref 1.0–2.5)
ALT: 24 U/L (ref 9–46)
AST: 34 U/L (ref 10–35)
Albumin: 4.7 g/dL (ref 3.6–5.1)
Alkaline phosphatase (APISO): 137 U/L (ref 35–144)
BUN: 20 mg/dL (ref 7–25)
CO2: 24 mmol/L (ref 20–32)
Calcium: 9.8 mg/dL (ref 8.6–10.3)
Chloride: 106 mmol/L (ref 98–110)
Creat: 1.18 mg/dL (ref 0.70–1.35)
Globulin: 2.4 g/dL (calc) (ref 1.9–3.7)
Glucose, Bld: 84 mg/dL (ref 65–99)
Potassium: 4.5 mmol/L (ref 3.5–5.3)
Sodium: 139 mmol/L (ref 135–146)
Total Bilirubin: 0.4 mg/dL (ref 0.2–1.2)
Total Protein: 7.1 g/dL (ref 6.1–8.1)
eGFR: 68 mL/min/{1.73_m2} (ref >=60)

## 2022-06-26 LAB — TESTOSTERONE: Testosterone: 351 ng/dL (ref 250–827)

## 2022-06-26 LAB — PSA: PSA: 4.07 ng/mL — ABNORMAL HIGH (ref <=4.00)

## 2022-06-26 LAB — HEMOGLOBIN A1C
Hgb A1c MFr Bld: 6.1 % of total Hgb — ABNORMAL HIGH (ref <5.7)
Mean Plasma Glucose: 128 mg/dL
eAG (mmol/L): 7.1 mmol/L

## 2022-06-26 LAB — INSULIN, RANDOM: Insulin: 8.4 u[IU]/mL

## 2022-06-26 LAB — TSH: TSH: 1.51 mIU/L (ref 0.40–4.50)

## 2022-06-26 LAB — MICROALBUMIN / CREATININE URINE RATIO
Creatinine, Urine: 143 mg/dL (ref 20–320)
Microalb Creat Ratio: 54 mg/g creat — ABNORMAL HIGH (ref <30)
Microalb, Ur: 7.7 mg/dL

## 2022-06-26 LAB — MAGNESIUM: Magnesium: 2.5 mg/dL (ref 1.5–2.5)

## 2022-06-26 LAB — VITAMIN D 25 HYDROXY (VIT D DEFICIENCY, FRACTURES): Vit D, 25-Hydroxy: 72 ng/mL (ref 30–100)

## 2022-06-26 NOTE — Progress Notes (Signed)
<><><><><><><><><><><><><><><><><><><><><><><><><><><><><><><><><> <><><><><><><><><><><><><><><><><><><><><><><><><><><><><><><><><> -   Test results slightly outside the reference range are not unusual. If there is anything important, I will review this with you,  otherwise it is considered normal test values.  If you have further questions,  please do not hesitate to contact me at the office or via My Chart.  <><><><><><><><><><><><><><><><><><><><><><><><><><><><><><><><><> <><><><><><><><><><><><><><><><><><><><><><><><><><><><><><><><><>  -  CXR   - No definite Pneumonia   <><><><><><><><><><><><><><><><><><><><><><><><><><><><><><><><><> <><><><><><><><><><><><><><><><><><><><><><><><><><><><><><><><><>

## 2022-06-26 NOTE — Progress Notes (Signed)
<><><><><><><><><><><><><><><><><><><><><><><><><><><><><><><><><> <><><><><><><><><><><><><><><><><><><><><><><><><><><><><><><><><> -   Test results slightly outside the reference range are not unusual. If there is anything important, I will review this with you,  otherwise it is considered normal test values.  If you have further questions,  please do not hesitate to contact me at the office or via My Chart.  <><><><><><><><><><><><><><><><><><><><><><><><><><><><><><><><><> <><><><><><><><><><><><><><><><><><><><><><><><><><><><><><><><><>  -  PSA - OK  <><><><><><><><><><><><><><><><><><><><><><><><><><><><><><><><><>  - A1c = 6.1% Blood sugar and A1c are elevated in the borderline and                                                             early or pre-diabetes range which has the same   300% increased risk for heart attack, stroke, cancer and                                              alzheimer- type vascular dementia as full blown diabetes.   But the good news is that diet, exercise with                                                     weight loss can cure the early diabetes at this point.  -  It is very important that you work harder with diet by                                avoiding all foods that are white except chicken, fish & calliflower.  - Avoid white rice  (brown & wild rice is OK),   - Avoid white potatoes  (sweet potatoes in moderation is OK),   White bread or wheat bread or anything made out of   white flour like bagels, donuts, rolls, buns, biscuits, cakes,  - pastries, cookies, pizza crust, and pasta (made from  white flour & egg whites)   - vegetarian pasta or spinach or wheat pasta is OK.  - Multigrain breads like Arnold's, Pepperidge Farm or   multigrain sandwich thins or high fiber breads like   Eureka bread or "Dave[employer] Killer" breads that are  4 to 5 grams fiber per slice !  are best.    <><><><><><><><><><><><><><><><><><><><><><><><><><><><><><><><><>  - Vitamin D = 72  - Excellent - Please keep dosages same   - <><><><><><><><><><><><><><><><><><><><><><><><><><><><><><><><><>  - Testosterone = 351- Low end of Normal  -   - Recommend take Zinc 50 mg tabs to help raise Testosterone level <><><><><><><><><><><><><><><><><><><><><><><><><><><><><><><><><>  - All Else - CBC - Kidneys - Electrolytes - Liver - Magnesium & Thyroid    - all  Normal / OK <><><><><><><><><><><><><><><><><><><><><><><><><><><><><><><><><> <><><><><><><><><><><><><><><><><><><><><><><><><><><><><><><><><>

## 2022-06-28 ENCOUNTER — Encounter: Payer: Self-pay | Admitting: Internal Medicine

## 2022-06-29 ENCOUNTER — Encounter: Payer: Self-pay | Admitting: Internal Medicine

## 2022-06-29 ENCOUNTER — Other Ambulatory Visit: Payer: Self-pay | Admitting: Internal Medicine

## 2022-06-29 MED ORDER — BENZONATATE 200 MG PO CAPS: ORAL_CAPSULE | ORAL | 1 refills | Status: DC

## 2022-06-29 MED ORDER — AZITHROMYCIN 250 MG PO TABS: ORAL_TABLET | ORAL | 1 refills | Status: DC

## 2022-07-02 DIAGNOSIS — N401 Enlarged prostate with lower urinary tract symptoms: Secondary | ICD-10-CM | POA: Diagnosis not present

## 2022-07-02 DIAGNOSIS — N281 Cyst of kidney, acquired: Secondary | ICD-10-CM | POA: Diagnosis not present

## 2022-07-02 DIAGNOSIS — N138 Other obstructive and reflux uropathy: Secondary | ICD-10-CM | POA: Diagnosis not present

## 2022-07-06 ENCOUNTER — Other Ambulatory Visit: Payer: Self-pay | Admitting: Nurse Practitioner

## 2022-07-16 ENCOUNTER — Ambulatory Visit (INDEPENDENT_AMBULATORY_CARE_PROVIDER_SITE_OTHER): Payer: Medicare HMO | Admitting: Internal Medicine

## 2022-07-16 ENCOUNTER — Encounter: Payer: Self-pay | Admitting: Internal Medicine

## 2022-07-16 VITALS — BP 128/78 | HR 60 | Temp 97.9°F | Resp 17 | Ht 68.0 in | Wt 193.2 lb

## 2022-07-16 DIAGNOSIS — J0410 Acute tracheitis without obstruction: Secondary | ICD-10-CM | POA: Diagnosis not present

## 2022-07-16 DIAGNOSIS — J4521 Mild intermittent asthma with (acute) exacerbation: Secondary | ICD-10-CM

## 2022-07-16 DIAGNOSIS — R052 Subacute cough: Secondary | ICD-10-CM | POA: Diagnosis not present

## 2022-07-16 DIAGNOSIS — M25571 Pain in right ankle and joints of right foot: Secondary | ICD-10-CM | POA: Diagnosis not present

## 2022-07-16 MED ORDER — DEXAMETHASONE 4 MG PO TABS: ORAL_TABLET | ORAL | 0 refills | Status: DC

## 2022-07-16 MED ORDER — MOXIFLOXACIN HCL 400 MG PO TABS: ORAL_TABLET | ORAL | 0 refills | Status: DC

## 2022-07-16 NOTE — Progress Notes (Signed)
Future Appointments  Date Time Provider Department  09/09/2022 10:20 AM Thomasene Ripple, DO [facility]  09/29/2022 11:00 AM Raynelle Dick, NP [facility]  12/31/2022 11:30 AM Lucky Cowboy, MD [facility]  04/05/2023 11:30 AM Raynelle Dick, NP [facility]  07/08/2023 11:00 AM Lucky Cowboy, MD [facility]    History of Present Illness:     This very nice 67 y.o.  [name] with  HTN, HLD, Prediabetes and Vitamin D Deficiency who presents with c/o  ongoing or persistent cough treated with Zpak / Decadron taper about 2 weeks ago . Denies fever dyspnea.    Current Outpatient Medications on File Prior to Visit  Medication Sig   albuterol (VENTOLIN HFA) 108 (90 Base) MCG/ACT inhaler Inhale 2 puffs into the lungs every 6 (six) hours as needed for wheezing or shortness of breath.   ALPRAZolam (XANAX) 0.5 MG tablet TAKE 1/2-1 TAB DAILY IF NEEDED FOR ANXIETY ATTACK (MAX 5 DAYS/WEEK TO AVOID ADDICTION/DEMENTIA)   Ascorbic Acid (VITAMIN C) 1000 MG tablet Take 1,000 mg by mouth 2 (two) times daily.   aspirin EC 81 MG tablet Take 81 mg by mouth daily. Swallow whole.   azithromycin (ZITHROMAX) 250 MG tablet Take 2 tablets with Food on  Day 1, then 1 tablet Daily with Food for Sinusitis / Bronchitis (Patient not taking: Reported on 07/16/2022)   benzonatate (TESSALON) 200 MG capsule Take 1 perle 3 x / day to prevent cough (Patient not taking: Reported on 07/16/2022)   Cholecalciferol (VITAMIN D-3) 5000 UNITS TABS Take 5,000 Units by mouth daily.    clobetasol cream (TEMOVATE) 0.05 % APPLY TO RASH TWICE DAILY AS NEEDED   [employer] STOOL SOFTENER/LAXATIVE 8.6-50 MG tablet Take 1 tablet by mouth 2 (two) times daily.   dexamethasone (DECADRON) 4 MG tablet Take 1 tab 3 x day for 5 days, then 2 x day for 5 days, then 1 tab daily   fexofenadine (ALLEGRA) 180 MG tablet Take by mouth.   Flaxseed, Linseed, (FLAX SEED OIL PO) Take 1,000 mg by mouth 3 (three) times daily.    fluticasone (FLONASE) 50 MCG/ACT nasal  spray Place 1 spray into both nostrils daily.   fluticasone-salmeterol (ADVAIR) 500-50 MCG/ACT AEPB Inhale 1 puff into the lungs in the morning and at bedtime.   Fluticasone-Salmeterol (WIXELA INHUB IN) Inhale into the lungs.   Glucosamine-Chondroitin (GLUCOSAMINE CHONDR COMPLEX PO) Take by mouth. Take 2 tablets daily   guaiFENesin (MUCINEX PO) Take by mouth.   ibuprofen (ADVIL) 600 MG tablet Take 1 tablet (600 mg total) by mouth every 6 (six) hours as needed.   Magnesium 500 MG TABS Take 1 tablet (500 mg total) by mouth daily.   Melatonin 10 MG CAPS Take by mouth Nightly.   moexipril (UNIVASC) 15 MG tablet TAKE 1 TABLET (15 MG TOTAL) BY MOUTH DAILY. FOR BLOOD PRESSURE   montelukast (SINGULAIR) 10 MG tablet Take 1 tablet (10 mg total) by mouth daily.   Omega-3 Fatty Acids (FISH OIL PO) Take 1,000 mg by mouth 3 (three) times daily.    pregabalin (LYRICA) 200 MG capsule Take 1 capsule 2 x /day   Probiotic Product (ALIGN) 4 MG CAPS Take 1 capsule daily for Probiotic Benefit   rosuvastatin (CRESTOR) 40 MG tablet TAKE 1 TABLET BY MOUTH EVERY DAY FOR CHOLESTEROL   tamsulosin (FLOMAX) 0.4 MG CAPS capsule Take 0.4 mg by mouth.   Testosterone 10 MG/ACT (2%) GEL APPLY THE CONTENTS OF 4    PUMP ACTUATIONS DAILY TO   THIGHS  vitamin B-12 (CYANOCOBALAMIN) 100 MCG tablet Take 100 mcg by mouth daily.   zolmitriptan (ZOMIG) 5 MG tablet TAKE ONE TABLET BY MOUTH AT ONSET OF HEADACHE; MAY REPEAT ONE TABLET IN 2 HOURS IF NEEDED.   No current facility-administered medications on file prior to visit.    No Known Allergies   Problem list He has Hypertension; Hyperlipidemia, mixed; Vitamin D deficiency; Prediabetes; Testosterone Deficiency; Encounter to establish care; Migraine; Overweight (BMI 25.0-29.9); Sinus bradycardia; Prostatism; BPH (benign prostatic hyperplasia); Insomnia; Iron deficiency anemia; Acute medial meniscus tear of right knee; Murmur; and ASCVD (arteriosclerotic cardiovascular disease) on  their problem list.   Observations/Objective:  BP 128/78   Pulse 60   Temp 97.9 F (36.6 C)   Resp 17   Ht 5\' 8"  (1.727 m)   Wt 193 lb 3.2 oz (87.6 kg)   SpO2 98%   BMI 29.38 kg/m   Dry cough . No Stridor.    HEENT - WNL. Neck - supple.  Chest - Few scattered fine rales. ? Forced post tussiven end expiratory fine wheezes. . Cor - Nl HS. RRR w/o sig MGR. PP 1(+). No edema. MS- FROM w/o deformities.  Gait Nl. Neuro -  Nl w/o focal abnormalities.   Assessment and Plan:   1. Tracheitis  - moxifloxacin (AVELOX) 400 MG tablet; Take  1 tablet   Daily  with  Food for Infection  Dispense: 10 tablet; Refill: 0 - dexamethasone (DECADRON) 4 MG tablet; Take 1 tab 3 x day - 3 days, then 2 x day - 3 days, then 1 tab daily  Dispense: 20 tablet; Refill: 0  2. Subacute cough  - moxifloxacin (AVELOX) 400 MG tablet; Take  1 tablet   Daily  with  Food for Infection  Dispense: 10 tablet; Refill: 0 - dexamethasone (DECADRON) 4 MG tablet; Take 1 tab 3 x day - 3 days, then 2 x day - 3 days, then 1 tab daily  Dispense: 20 tablet; Refill: 0  3. Mild intermittent asthma with acute exacerbation  - moxifloxacin (AVELOX) 400 MG tablet; Take  1 tablet   Daily  with  Food for Infection  Dispense: 10 tablet; Refill: 0 - dexamethasone (DECADRON) 4 MG tablet; Take 1 tab 3 x day - 3 days, then 2 x day - 3 days, then 1 tab daily  Dispense: 20 tablet; Refill: 0  Advised stop Univasc  & monitor  BP  Follow Up Instructions:        I discussed the assessment and treatment plan with the patient. The patient was provided an opportunity to ask questions and all were answered. The patient agreed with the plan and demonstrated an understanding of the instructions.       The patient was advised to call back or seek an in-person evaluation if the symptoms worsen or if the condition fails to improve as anticipated.    Kirtland Bouchard, MD

## 2022-07-22 ENCOUNTER — Telehealth: Payer: Self-pay | Admitting: Internal Medicine

## 2022-07-22 NOTE — Progress Notes (Signed)
  Chronic Care Management   Note  07/22/2022 Name: Gerald [name] MRN: [id number] DOB: 24-Jul-1955  Gerald [name] is a 67 y.o. year old male who is a primary care patient of Lucky Cowboy, MD. I reached out to Katherina Mires by phone today in response to a referral sent by [name] PCP, Lucky Cowboy, MD.   Mr. Tomaro was given information about Chronic Care Management services today including:  CCM service includes personalized support from designated clinical staff supervised by his physician, including individualized plan of care and coordination with other care providers 24/7 contact phone numbers for assistance for urgent and routine care needs. Service will only be billed when office clinical staff spend 20 minutes or more in a month to coordinate care. Only one practitioner may furnish and bill the service in a calendar month. The patient may stop CCM services at any time (effective at the end of the month) by phone call to the office staff.   Patient agreed to services and verbal consent obtained.   Follow up plan:   [employer], [employer]

## 2022-07-27 ENCOUNTER — Telehealth: Payer: Self-pay | Admitting: Pharmacist

## 2022-07-27 NOTE — Progress Notes (Signed)
HC chart prep started. Reviewed office visits, specialist visit, hospital visits, medications, adherence and labs. Patient contacted and confirmed visit, and pre-call completed.   Total time ( )

## 2022-07-28 ENCOUNTER — Ambulatory Visit: Payer: Medicare HMO | Admitting: Pharmacist

## 2022-07-28 DIAGNOSIS — N138 Other obstructive and reflux uropathy: Secondary | ICD-10-CM

## 2022-07-28 DIAGNOSIS — Z79899 Other long term (current) drug therapy: Secondary | ICD-10-CM

## 2022-07-28 DIAGNOSIS — R052 Subacute cough: Secondary | ICD-10-CM

## 2022-07-28 DIAGNOSIS — I10 Essential (primary) hypertension: Secondary | ICD-10-CM

## 2022-07-28 DIAGNOSIS — J4521 Mild intermittent asthma with (acute) exacerbation: Secondary | ICD-10-CM

## 2022-07-28 NOTE — Progress Notes (Unsigned)
Initial Pharmacist Visit (CCM)  Gerald [name],Gerald [name]  67 years, Male  DOB: [birth year]  M: [phone]  ____________________________________________Summary for PCP:   1. Pt to monitor home BP- encouraged to call for > 2 readings/wk > 150/90; recommend ARB (Losartan) if elevated in the future. 2. Insomnia- maxed out on Melatonin effective dose (  HS); could try Trazodone  HS- will discuss at visit in July. 3. Encouraged pt to follow up with pulmonology re: sputum cx being an option?  Patient Chart Prep (HC) Chronic Conditions Patient's Chronic Conditions: Hypertension (HTN), Cardiovascular Disease (CVD), Benign Prostatic Hyperplasia (BPH), Hyperlipidemia/Dyslipidemia (HLD), Diabetes (DM), Insomnia, Asthma, Other, Anxiety, Vitamins/Supplements List Other Conditions (separated by comma): Migraines, Vit D deficiency, testosterone deficiency   Doctor and Hospital Visits Were there PCP Visits in last 6 months?: Yes PCP Visits details:  04/11/24Oneta Rack (PCP)- started Moxifloxacin HCl  qd for 10 days, changed dexamethasone to 1 tab tid then bid then qd. 03/25/24Oneta Rack (PCP)- stopped Univasc/ Moexipril, started azithromycin  2 tabs day 1 then qd for 4 days, started benzonatate  tid prn. 03/21/24Oneta Rack (PCP)- started dexamethasone  1 tab tid for 5 days then bid for 5 days then qd. 05/15/22- Aundria Rud (PCP office)- started fluticasone propionate 1 spray each nare daily, started montelukast sodium  daily. 01/26/24Aundria Rud (PCP office)- started albuterol sulfate 108 MCG 2 puffs q6hr prn, started fluticasone-umeclidin-vilant 100-32.5-25 mch inhalation daily, started prednisone  3 tabs daily for 3 days then 2 tabs daily for 3 days then 1 tab daily for 5 days. 11/28/23Aundria Rud (PCP office)- no med changes.  Were there Specialist Visits in last 6 months?: Yes Specialist Visits details:  06/24/22- DeBaun (Ortho surgeon)- unable to view notes. 06/15/22-  Hunsucker (Pulmonology)- started fluticasone-salmeterol 500-50 mcg/act 1 puff bid. 06/11/22- Ward (Physical Therapy)- pt sees PT for pain in right ankle and joints of foot. 05/22/22- Klepper (Urology)- started finasteride  once a day. 05/20/22- Scales (Urology)- procedure visit and unable to view them. 04/28/22- Dasher (Dermatology)- unable to view notes. 01/23/24Evonnie Dawes (Dermatology)- unable to view notes. 04/15/22- Tobb (Cardiology)- no med changes. 12/15/23Jess Barters (Ortho)- no med changes. 03/10/22- Tobb (Cardiology)- increased rosuvastatin to  once a day. 02/24/22- Klepper (Urology)- unable to view notes. 11/17/23Jess Barters (Ortho)- unable to view notes. 02/10/22- Klepper (Urology)- unable to view notes. 10/31/23Emily Filbert (Optometry)- unable to view notes.  Was there a Hospital Visit in last 30 days?: No Were there other Hospital Visits in last 6 months?: No  Disease Assessments Visit Date Visit Completed on: 07/28/2022  Subjective Information Did patient bring medications to appointment?: No What source (s) was used to reconcile medications this visit?: Patient/caregiver list, EMR list Subjective: Completed telemedicine visit with patient for CCM Initial Visit. Gerald [name] lives with his wife, but has been cleaning out his mothers house and storage buildings. He has a history of a femur fracture and pulmonary issues which he contributes to cleaning up after his mothers cats. His main issue is this pulmonary issue since January- been on several abx and steroids since. He is retired and is independent of ADLs, still drives. He has 3 children, 1 grandchild, none of who live close by. He enjoys traveling, gardening, fishing, has a boat and enjoys hiking. Lifestyle habits: Diet: healthy diet for the most part Exercise: [employer] 2x per week  Tobacco: denies Alcohol: 2-5x per week (1-2 drinks) Caffeine: sweet tea Recreational drugs: denies What is the patient's sleep pattern?: Trouble  staying asleep, Trouble going back to sleep after waking  up How many hours per night does patient typically sleep?: 6-7  SDOH: Accountable Health Communities Health-Related Social Needs Screening Tool ([employer]) SDOH questions completed during initial visit?: Yes What is your living situation today? (ref #1): I have a steady place to live Think about the place you live. Do you have problems with any of the following? (ref #2): None of the above Within the past 12 months, you worried that your food would run out before you got money to buy more (ref #3): Never true Within the past 12 months, the food you bought just didn't last and you didn't have money to get more (ref #4): Never true In the past 12 months, has lack of reliable transportation kept you from medical appointments, meetings, work or from getting things needed for daily living? (ref #5): No In the past 12 months, has the electric, gas, oil, or water company threatened to shut off services in your home? (ref #6): No How often does anyone, including family and friends, physically hurt you? (ref #7): Never (1) How often does anyone, including family and friends, insult or talk down to you? (ref #8): Never (1) How often does anyone, including friends and family, threaten you with harm? (ref #9): Never (1) How often does anyone, including family and friends, scream or curse at you? (ref #10): Never (1)  Medication Adherence Does the [facility] have access to medication refill history?: Yes Medication adherence rates for STAR metric medications: Rosuvastatin 40mg - 06/06/22 90DS, 03/10/22 90DS Moexipril HCl 15mg - 06/22/22 90DS, 03/16/22 90DS Medication adherence rates for non-STAR metric medications: . Name and location of Current pharmacy: [facility] [place] Current Rx insurance plan: [employer] Are meds synced by current pharmacy?: No Are meds delivered by current pharmacy?: No - delivery not  available Would patient benefit from direct intervention of clinical lead in dispensing process to optimize clinical outcomes?: No Are [facility] pharmacy services available where patient lives?: No Does patient experience delays in picking up medications due to transportation concerns (getting to pharmacy)?: No Assessment:: Adherent  Hypertension (HTN) Most Recent BP: 128/78 Most Recent HR: 60 taken on: 07/16/2022 Care Gap: Need BP documented or last BP 140/90 or higher: Addressed Assessed today?: Yes Goal: <140/90 mmHG Is Patient checking BP at home?: Yes Patient home BP readings are ranging: 130-140/80-85 Has patient experienced hypotension, dizziness, falls or bradycardia?: No We discussed: DASH diet:  following a diet emphasizing fruits and vegetables and low-fat dairy products along with whole grains, fish, poultry, and nuts. Reducing red meats and sugars., Reducing the amount of salt intake to 1500mg /per day., Proper Home BP Measurement, Increasing movement, Increasing exercise (walking, biking, swimming) to a goal of 30 minutes per day, as able based on current activity level and health or as directed by your healthcare provider., Contacting PCP office for signs and symptoms of high or low blood pressure (hypotension, dizziness, falls, headaches, edema) Assessment:: Controlled Plan/Follow up: Goal:  BP < 140/90 STOP:  Moexipril (d/c'd by Dr. Oneta Rack at last OV due to cough); recommend initiating losartan if BP starts to elevate. Order: CMP at next office visit. Counsel: Continue to monitor home BP with log; call office for > 2 readings > 150/90 so that therapy can be re-initiated if needed.  Hyperlipidemia/Dyslipidemia (HLD) Last Lipid panel on: 06/16/2022 TC (Goal<200): 136 LDL: 67 HDL (Goal>40): 51 TG (Goal<150): 98 ASCVD 10-year risk?is:: N/A due to existing ASCVD Assessed today?: No Drug: Rosuvastatin 40mg  once a day  Pharmacist Assessment: Appropriate, Effective, Safe,  Accessible  Diabetes (  DM) Most recent A1C: 6.1 taken on: 06/25/2022 Previous A1C: 5.6 taken on: 05/15/2021 Most Recent GFR: 68 taken on: 06/25/2022 Type: Pre-Diabetes/Impaired Fasting Glucose Assessed today?: No  Anxiety Most Recent GAD-7 Score: unable to obtain  Assessed today?: No Drug: Alprazolam 0.5mg  1/2-1 tab daily as needed  Pharmacist Assessment: Appropriate, Effective, Safe, Accessible  BPH Most Recent PSA: 4.07 taken on: 06/25/2022 Assessed today?: Yes Are you experiencing any side effects from your BPH medication?: None What changes have you made to your diet / lifestyle to help manage your BPH symptoms?: Taking extra time to ensure complete voiding of the bladder (i.e. waiting a few moments then trying to void again), Reducing liquid consumption for 2 hours before bedtime Completing BPH AUA Questionnaire today?: No How would you feel if you had to live with your urinary condition the way it is now, no better, no worse, for the rest of your life?: Mostly Satisfied We discussed: Double voiding, Limiting caffeine intake, Avoid decongestants and antihistamines, The side effects of alpha-blocking agents and to monitor for increased dizziness and lowered blood pressure, especially when changing positions. Assessment:: Controlled Drug: Tamsulosin 0.4mg  once a day  Pharmacist Assessment: Appropriate, Effective, Safe, Accessible Drug: Finasteride 5mg  - 1 tablet daily Pharmacist Assessment: Appropriate, Effective, Safe, Accessible Plan/Follow up: Goal:  Patient will have decreased urinary symptoms, frequency, and more complete urine voiding START:  Finasteride 5mg - started by [facility] at last visit in March Counsel: Discussed MOA of finasteride vs. tamsulosin as pt reports slower onset of finasteride. Take medications as prescribed and monitor urinary symptoms, follow up with urology.  Asthma Pre-bronchodilator FEV1: unable to obtain  Assessed today?: Yes Asthma Symptom  Classification: Mild Persistent Exacerbations in past year without hospitalization: No Is patient currently Smoking or Vaping?: No Did patient smoke/vape before?: No Frequency of SABA use: 1-2 times per week We discussed: Proper inhaler technique, Allergen mitigation and trigger avoidance, Rinsing mouth after using inhaled corticosteroid Assessment:: Uncontrolled Drug: Albuterol 108 (90 base) MCG/ACT inhale 2 puffs every 6 hours as needed  Drug: Fluticasone-Salmeterol 500-68mcg/act 1 puff in the morning and at bedtime  Drug: Montelukast 10mg  once a day  Drug: Benzonatate 100mg - 1 capsule 3 times daily Plan/Follow up: Goal:  Decreased burden of asthma symptoms such as chronic cough, chest tightness, and shortness of breath. Modify: No changes to regimen at this time- will complete steroid taper in 2 days; just completed Avelox 2 days ago. Recommended sputum culture at next pulmonary visit. Order:  PFTs at next office visit Counsel: Rinse mouth and spit after out after using Advair/Trelegy.  IF you require use of albuterol more and more frequently, contact the chronic care management team or the office immediately before it gets bad enough to require hospitalization. Advair/Trelegy= maintenance inhaler albuterol= rescue inhaler  Cardiovascular Disease (CVD) Care Gap: Moderate / high intensity statin therapy needed: Addressed Assessed today?: No Drug: Rosuvastatin 40mg  once a day  Pharmacist Assessment: Appropriate, Effective, Safe, Accessible  Insomnia Assessed today?: Yes Patient has following issues with sleeping: Trouble falling asleep, Trouble staying asleep, Trouble falling back to sleep after waking We discussed: Avoiding naps during the day to help sleep longer at night, Ensuring room is cool and dark when trying to sleep, Avoiding screens (TV, phone, tablets) prior to bedtime, Establishing a nighttime routine including a consistent bedtime Assessment:: Uncontrolled Drug: Melatonin  10mg  at bedtime  Pharmacist Assessment: Appropriate, Query Effectiveness Plan/Follow up: Goal: Increased quality and quantity of sleep Recommend: Trazodone 25mg  HS Counsel: Discussed recommendation of  Trazodone (pt will discuss at next PCP visit), proper sleep hygiene.  Vitamins / Supplements We discussed: Confirming with pharmacist the safety of new vitamins / supplements with current medications before starting / adding to medication regimen Drug: Vitamin D-3 5000 units daily Drug: Ascorbic Acid 1000 mg twice a day  Drug: Flaxseed, Linseed 1000mg  three times a day Drug: Glucosamine-chondroitin 2 tablets daily   Drug: Magnesium 500mg  daily Drug: Omega 3 fatty acids 1000 mg three times a day, Vitamin B-12 daily   General Assessed today?: No  Preventative Health Care Gap: Colorectal cancer screening: Addressed Care Gap: Annual Wellness Visit (AWV): Addressed Immunizations needed: COVID-19, Zoster Additional exercise counseling points. We discussed: targeting at least 150 minutes per week of moderate-intensity aerobic exercise., incorporating flexibility, balance, and strength training exercises Additional diet counseling points. We discussed: key components of a plant-based diet, aiming to consume at least 8 cups of water day  Clinical Summary Summary Next Pharmacist Follow Up: 1 year Next AWV: 12/31/2022 Recommended CRN Follow-Up in: Not indicated at this time Physician Referral Statement:: I reaffirm my previous referral of patient for CCM services Attestation Statement:: CCM Services:  This encounter meets complex CCM services and moderate to high medical decision making.  Prior to outreach and patient consent for Chronic Care Management, I referred this patient for services after reviewing the nominated patient list or from a personal encounter with the patient.  I have personally reviewed this encounter including the documentation in this note and have collaborated with  the care management provider regarding care management and care coordination activities to include development and update of the comprehensive care plan I am certifying that I agree with the content of this note and encounter as supervising physician.  Pharmacist Interventions Intervention Details Pharmacist Interventions discussed: Yes Started Therapy: Immunizations recommended, Preventative therapy Monitoring: Preventative health screenings, Routine monitoring Education: Medication administration, Lifestyle modifications  Follow Ups CRN Follow Up and Interim Scheduling CRN visit Scheduled and protocol assigned: N/A Care Plan created and initiated: Done  CTL assignment Visit Details Patient scheduled for CCM visit with the clinical pharmacist.  Patient is referred for CCM by their PCP and Clinical Pharmacist is under general PCP supervision.: At least 2 of these conditions are expected to last 12 months or longer and patient is at significant risk for acute exacerbations and/or functional decline.  Patient has consented to participation in CCM program. Visit Type: Phone Call Date of Upcoming Visit: 08/18/2022  CP Care Plan  Vivero,Zael  89 years, Male  DOB: 05-Sep-1955  M: [phone]  __________________________________________________  General Information Details Chronic Conditions: Benign Prostatic Hyperplasia (BPH), Cardiovascular Disease (CVD), Asthma, Hypertension (HTN), Hyperlipidemia/Dyslipidemia (HLD), Diabetes (DM), Insomnia Contact your clinical pharmacist and care team with any questions or concerns. Team Phone #:: [phone] Clinical Pharmacist:: Carmelina Dane Coordinating Registered Nurse: Signa Kell  Health Concierge:: [facility]  High Blood Pressure GOAL: Maintain Blood Pressure less than: 140/90 Your most recent BP is: 128/78 taken on: 07/16/2022 Patient education:: Recommend following the DASH diet, which emphasizes fruits and vegetables and  low-fat dairy products along with whole grains, fish, poultry, and nuts. Reduce red meats and sugars., Recommend limiting the daily amount of salt intake to less than 1500mg /per day or 2/3 teaspoonful a day, Recommend using a salt substitute to replace table salt if extra seasoning/flavor is needed., Discussed ways to increase movement., Discussed increasing exercise (walking, biking, swimming) to a goal of 30 minutes per day, as able based on current  activity level and health and/or as directed by your healthcare provider., Educated patient to call PCP office if you develop episodes of low blood pressure, dizziness, falls or increased headaches, and/or swelling of legs and feet. Discussed checking and recording home blood pressure readings: Daily  High Cholesterol GOAL:: Maintain triglycerides less than 150, Maintain LDL (bad) cholesterol less than 100 Your most recent LDL level is: 71 Your most recent triglycerides level is: 98 taken on: 06/16/2022 Patient education:: Discussed increasing exercise (walking, biking, swimming) to a goal of 30 minutes per day, as able based on current activity level and health and/or as directed by your healthcare provider., Discussed how a diet high in fruits/vegetables/nuts/whole grains/beans may help to reduce your cholesterol. Increasing soluble fiber intake (whole grains, fruits, vegetables, beans, nuts and seeds). Recommended avoiding sugary foods and trans fat, limiting carbohydrates, and reducing portion sizes. Recommended Patient to incorporate more healthy fats (salmon, cold-water fish, almonds, walnuts) into their diet. Your current cholesterol medications are:: Rosuvastatin 40mg  once a day  Diabetes (DM) GOAL: Maintain an A1c (long-term blood sugar control) less than: 7% Your most recent A1C is: 5.7 taken on: 11/24/2021 Patient education:: Recommend avoiding both table sugar (ex: regular sodas, juice and sweet tea) and artificially (aspartame, saccharin,  sucralose, etc.) sweetened beverages (ex: diet sodas) and increasing water intake., Discussed increasing exercise (walking, biking, swimming) to a goal of 30 minutes per day, as able based on current activity level and health and/or as directed by your healthcare provider., Education provided on importance of regular mealtimes, carbohydrates goals for each meal and snacks, food label reading, portion sizes, and increasing intake of dietary fiber (whole grains, fruits, vegetables, beans, nuts and seeds)., Discussed a goal for after meal (within 2 hours) blood sugar readings to be less than 180, Discussed a goal for fasting blood sugar readings to be between 80 and 130, Recommend scheduling a yearly eye exam., Recommend seeing the dentist twice yearly.  Enlarged Prostate GOAL:: Reduce urinary symptoms Patient education:: Educated patient to avoid over the counter decongestants (phenylephrine and pseudoephedrine) and antihistamines since these can worsen symptoms of enlarged prostate., Educated patient to monitor for increased dizziness and lowered blood pressure, especially when changing positions, when taking treatments for enlarged prostate., Recommend spending extra time urinating by practicing double voiding. Urinate, then relax for a few moments, then try to urinate again., Educated Patient to limit fluids for 2-3 hours prior to bedtime, and avoid caffeine or alcohol in the afternoons Your current prostate medications are:: Tamsulosin 0.4mg  once a day Finasteride 5mg  - 1 tablet daily  Asthma GOAL: Prevent: Worsening shortness of breath, Hospitalizations Your maintenance inhaler should be used regularly to control symptoms. Your maintenance inhaler is: Fluticasone-Salmeterol 500-44mcg/act 1 puff in the morning and at bedtime Your rescue inhaler should be used when your symptoms are worsening or not controlled with rest. Your rescue inhaler is: Albuterol 108 (90 base) MCG/ACT inhale 2 puffs every 6 hours  as needed  Patient education:: Discussed with patient the proper use of the daily maintenance inhaler and rescue inhaler. Call your pharmacist if you have questions on how to use this., Continue using your maintenance inhaler as prescribed every day to help prevent decline in breathing., Continue using  your rescue inhaler as prescribed when you have symptoms of cough, wheezing, and/or shortness of breath.  If you notice you are using your rescue inhaler more often, call your pharmacist/provider., Rinse mouth after using your maintenance inhaler Your current asthma medications are:: Fexofenadine 180mg  once  a day Flonase 62mcg/act 1 spray both nostrils daily Montelukast  once a day Fluticasone-Salmeterol 500-62mcg/act 1 puff in the morning and at bedtime Albuterol 108 (90 base) MCG/ACT inhale 2 puffs every 6 hours as needed  Cardiovascular Disease (CVD) GOAL:: Slow progression of atherosclerosis (plaques / blockages) throughout your body to reduce risk of heart attack and strokes Patient education:: Discussed  the importance of their prescribed statin therapy. Though you cannot feel the effects of the statin, they work to reduce cholesterol and reduce the risk of future heart attack and stroke., Recommend contacting 911 if you have sudden one-sided weakness in the face, arm or leg; trouble speaking or understanding someone else talking, and/or facial droop., Recommend contacting 911 if you have sudden severe chest pain and/or tightness, left arm pain and/or shortness of breath., Discussed with patient importance of healthy lifestyle behaviors, maintain healthy weight, limiting alcohol intake, low-fat and low-sodium diet, avoiding tobacco use, managing stress, and physical activity Your current cardiovascular medications are:: Rosuvastatin  once a day  Insomnia GOAL:: Improve sleep quality and quantity to minimize impact on daytime functioning Patient education:: Recommend avoiding large meals  and alcohol close to bedtime., Recommend avoiding naps during the day to help with longer sleep at night., Recommend avoiding looking at any screens prior to bed, including TVs, computers, and cell phone screens. The light that comes from these devices can make it difficult to fall asleep., Recommend avoiding drinks with caffeine (coffee, soda, tea, energy drinks) after lunch time., Recommend maintaining consistent sleep habits, including going to bed and waking up at the same time each day and making sure the bedroom is dark, cool, and quiet. Your current insomnia medications are:: Melatonin  at bedtime  Other Needs Recommended Preventative Health Care:: Recommend scheduling Annual Wellness Visit, Recommend colorectal cancer screening, Recommend yearly flu vaccine  CP chart Prep 15 min  CP Telemedicine Visit 40 min  CP OV Documentation 15 min  CP Initial Care Plan 5 min

## 2022-07-29 DIAGNOSIS — W19XXXD Unspecified fall, subsequent encounter: Secondary | ICD-10-CM | POA: Diagnosis not present

## 2022-07-29 DIAGNOSIS — W11XXXD Fall on and from ladder, subsequent encounter: Secondary | ICD-10-CM | POA: Diagnosis not present

## 2022-07-29 DIAGNOSIS — N138 Other obstructive and reflux uropathy: Secondary | ICD-10-CM | POA: Diagnosis not present

## 2022-07-29 DIAGNOSIS — R339 Retention of urine, unspecified: Secondary | ICD-10-CM | POA: Diagnosis not present

## 2022-07-29 DIAGNOSIS — N401 Enlarged prostate with lower urinary tract symptoms: Secondary | ICD-10-CM | POA: Diagnosis not present

## 2022-07-29 DIAGNOSIS — S022XXD Fracture of nasal bones, subsequent encounter for fracture with routine healing: Secondary | ICD-10-CM | POA: Diagnosis not present

## 2022-08-04 DIAGNOSIS — N401 Enlarged prostate with lower urinary tract symptoms: Secondary | ICD-10-CM | POA: Diagnosis not present

## 2022-08-04 DIAGNOSIS — I10 Essential (primary) hypertension: Secondary | ICD-10-CM | POA: Diagnosis not present

## 2022-08-04 DIAGNOSIS — N138 Other obstructive and reflux uropathy: Secondary | ICD-10-CM | POA: Diagnosis not present

## 2022-08-04 DIAGNOSIS — J4521 Mild intermittent asthma with (acute) exacerbation: Secondary | ICD-10-CM | POA: Diagnosis not present

## 2022-08-06 ENCOUNTER — Other Ambulatory Visit: Payer: Self-pay

## 2022-08-06 DIAGNOSIS — Z1212 Encounter for screening for malignant neoplasm of rectum: Secondary | ICD-10-CM | POA: Diagnosis not present

## 2022-08-06 DIAGNOSIS — Z1211 Encounter for screening for malignant neoplasm of colon: Secondary | ICD-10-CM

## 2022-08-06 LAB — POC HEMOCCULT BLD/STL (HOME/3-CARD/SCREEN)
Card #2 Fecal Occult Blod, POC: NEGATIVE
Card #3 Fecal Occult Blood, POC: NEGATIVE
Fecal Occult Blood, POC: NEGATIVE

## 2022-08-09 ENCOUNTER — Other Ambulatory Visit: Payer: Self-pay | Admitting: Nurse Practitioner

## 2022-08-09 DIAGNOSIS — Z9109 Other allergy status, other than to drugs and biological substances: Secondary | ICD-10-CM

## 2022-08-09 DIAGNOSIS — J4521 Mild intermittent asthma with (acute) exacerbation: Secondary | ICD-10-CM

## 2022-08-18 ENCOUNTER — Telehealth: Payer: Medicare HMO | Admitting: Pharmacist

## 2022-08-24 ENCOUNTER — Ambulatory Visit: Payer: Medicare HMO | Admitting: Nurse Practitioner

## 2022-09-08 DIAGNOSIS — I129 Hypertensive chronic kidney disease with stage 1 through stage 4 chronic kidney disease, or unspecified chronic kidney disease: Secondary | ICD-10-CM | POA: Diagnosis not present

## 2022-09-08 DIAGNOSIS — N400 Enlarged prostate without lower urinary tract symptoms: Secondary | ICD-10-CM | POA: Diagnosis not present

## 2022-09-08 DIAGNOSIS — N182 Chronic kidney disease, stage 2 (mild): Secondary | ICD-10-CM | POA: Diagnosis not present

## 2022-09-08 DIAGNOSIS — K5900 Constipation, unspecified: Secondary | ICD-10-CM | POA: Diagnosis not present

## 2022-09-08 DIAGNOSIS — R011 Cardiac murmur, unspecified: Secondary | ICD-10-CM | POA: Diagnosis not present

## 2022-09-08 DIAGNOSIS — Z008 Encounter for other general examination: Secondary | ICD-10-CM | POA: Diagnosis not present

## 2022-09-08 DIAGNOSIS — G43909 Migraine, unspecified, not intractable, without status migrainosus: Secondary | ICD-10-CM | POA: Diagnosis not present

## 2022-09-08 DIAGNOSIS — E291 Testicular hypofunction: Secondary | ICD-10-CM | POA: Diagnosis not present

## 2022-09-08 DIAGNOSIS — G4700 Insomnia, unspecified: Secondary | ICD-10-CM | POA: Diagnosis not present

## 2022-09-08 DIAGNOSIS — E785 Hyperlipidemia, unspecified: Secondary | ICD-10-CM | POA: Diagnosis not present

## 2022-09-08 DIAGNOSIS — M1990 Unspecified osteoarthritis, unspecified site: Secondary | ICD-10-CM | POA: Diagnosis not present

## 2022-09-08 DIAGNOSIS — I2510 Atherosclerotic heart disease of native coronary artery without angina pectoris: Secondary | ICD-10-CM | POA: Diagnosis not present

## 2022-09-08 DIAGNOSIS — N529 Male erectile dysfunction, unspecified: Secondary | ICD-10-CM | POA: Diagnosis not present

## 2022-09-09 ENCOUNTER — Ambulatory Visit: Payer: Medicare HMO | Attending: Cardiology | Admitting: Cardiology

## 2022-09-09 ENCOUNTER — Encounter: Payer: Self-pay | Admitting: Cardiology

## 2022-09-09 VITALS — BP 148/92 | HR 58 | Ht 68.0 in | Wt 195.8 lb

## 2022-09-09 DIAGNOSIS — R7303 Prediabetes: Secondary | ICD-10-CM

## 2022-09-09 DIAGNOSIS — I2510 Atherosclerotic heart disease of native coronary artery without angina pectoris: Secondary | ICD-10-CM | POA: Diagnosis not present

## 2022-09-09 DIAGNOSIS — E782 Mixed hyperlipidemia: Secondary | ICD-10-CM | POA: Diagnosis not present

## 2022-09-09 DIAGNOSIS — I10 Essential (primary) hypertension: Secondary | ICD-10-CM | POA: Insufficient documentation

## 2022-09-09 MED ORDER — AMLODIPINE BESYLATE 5 MG PO TABS: 5.0000 mg | ORAL_TABLET | Freq: Every day | ORAL | 3 refills | Status: DC

## 2022-09-09 NOTE — Progress Notes (Signed)
Cardiology Office Note:    Date:  09/09/2022   ID:  Gerald [name], DOB 04-May-1955, MRN [id number]  PCP:  Lucky Cowboy, MD  Cardiologist:  Thomasene Ripple, DO  Electrophysiologist:  None   Referring MD: Lucky Cowboy, MD   " I am ok"  History of Present Illness:    Gerald [name] is a 67 y.o. male with a hx of hypertension, hyperlipidemia, prediabetes, on recent CT of the chest showed severe three-vessel coronary calcification, and obesity.    He offers no complaints at this time.  He is here with his wife.  He has questions about his testing results. I was able to answer his questions. Since his visit he was taken off his ace-inhibitor due to coughing. He has his blood pressure recording today and blood pressure is not at target.  Past Medical History:  Diagnosis Date   Acute medial meniscus tear of right knee 02/07/2021   Headache    Hyperlipidemia    Hypertension    Pre-diabetes    no meds   Vitamin D deficiency     Past Surgical History:  Procedure Laterality Date   ESOPHAGOGASTRODUODENOSCOPY     KNEE ARTHROSCOPY WITH MEDIAL MENISECTOMY Right 02/20/2021   Procedure: KNEE ARTHROSCOPY WITH MEDIAL MENISECTOMY;  Surgeon: Salvatore Marvel, MD;  Location: [facility];  Service: Orthopedics;  Laterality: Right;   KNEE SURGERY     left   ORIF ACETABULAR FRACTURE     left arm 2 times    Current Medications: Current Meds  Medication Sig   albuterol (VENTOLIN HFA) 108 (90 Base) MCG/ACT inhaler Inhale 2 puffs into the lungs every 6 (six) hours as needed for wheezing or shortness of breath.   ALPRAZolam (XANAX) 0.5 MG tablet TAKE 1/2-1 TAB DAILY IF NEEDED FOR ANXIETY ATTACK (MAX 5 DAYS/WEEK TO AVOID ADDICTION/DEMENTIA)   amLODipine (NORVASC) 5 MG tablet Take 1 tablet (5 mg total) by mouth daily.   Ascorbic Acid (VITAMIN C) 1000 MG tablet Take 1,000 mg by mouth 2 (two) times daily.   aspirin EC 81 MG tablet Take 81 mg by mouth daily. Swallow whole.    Cholecalciferol (VITAMIN D-3) 5000 UNITS TABS Take 5,000 Units by mouth daily.    clobetasol cream (TEMOVATE) 0.05 % APPLY TO RASH TWICE DAILY AS NEEDED   [employer] STOOL SOFTENER/LAXATIVE 8.6-50 MG tablet Take 1 tablet by mouth 2 (two) times daily.   fexofenadine (ALLEGRA) 180 MG tablet Take by mouth.   finasteride (PROSCAR) 5 MG tablet Take 5 mg by mouth daily.   Flaxseed, Linseed, (FLAX SEED OIL PO) Take 1,000 mg by mouth 3 (three) times daily.    fluticasone (FLONASE) 50 MCG/ACT nasal spray Place 1 spray into both nostrils daily.   Fluticasone-Salmeterol (WIXELA INHUB IN) Inhale into the lungs.   Glucosamine-Chondroitin (GLUCOSAMINE CHONDR COMPLEX PO) Take by mouth. Take 2 tablets daily   guaiFENesin (MUCINEX PO) Take by mouth.   ibuprofen (ADVIL) 600 MG tablet Take 1 tablet (600 mg total) by mouth every 6 (six) hours as needed.   Magnesium 500 MG TABS Take 1 tablet (500 mg total) by mouth daily.   Melatonin 10 MG CAPS Take by mouth Nightly.   montelukast (SINGULAIR) 10 MG tablet TAKE 1 TABLET BY MOUTH EVERY DAY   Omega-3 Fatty Acids (FISH OIL PO) Take 1,000 mg by mouth 3 (three) times daily.    pregabalin (LYRICA) 200 MG capsule Take 1 capsule 2 x /day   Probiotic Product (ALIGN) 4 MG CAPS  Take 1 capsule daily for Probiotic Benefit   rosuvastatin (CRESTOR) 40 MG tablet TAKE 1 TABLET BY MOUTH EVERY DAY FOR CHOLESTEROL   tamsulosin (FLOMAX) 0.4 MG CAPS capsule Take 0.4 mg by mouth.   Testosterone 10 MG/ACT (2%) GEL APPLY THE CONTENTS OF 4    PUMP ACTUATIONS DAILY TO   THIGHS   vitamin B-12 (CYANOCOBALAMIN) 100 MCG tablet Take 100 mcg by mouth daily.   zolmitriptan (ZOMIG) 5 MG tablet TAKE ONE TABLET BY MOUTH AT ONSET OF HEADACHE; MAY REPEAT ONE TABLET IN 2 HOURS IF NEEDED.     Allergies:   Patient has no known allergies.   Social History   Socioeconomic History   Marital status: Married    Spouse name: Not on file   Number of children: Not on file   Years of education: Not on file    Highest education level: Not on file  Occupational History   Not on file  Tobacco Use   Smoking status: Never   Smokeless tobacco: Never  Vaping Use   Vaping Use: Never used  Substance and Sexual Activity   Alcohol use: Yes    Alcohol/week: 2.0 - 5.0 standard drinks of alcohol    Types: 2 - 5 Standard drinks or equivalent per week   Drug use: No   Sexual activity: Not on file  Other Topics Concern   Not on file  Social History Narrative   Not on file   Social Determinants of Health   Financial Resource Strain: Not on file  Food Insecurity: Not on file  Transportation Needs: Not on file  Physical Activity: Not on file  Stress: Not on file  Social Connections: Not on file     Family History: The patient's family history includes Diabetes in his mother; Hyperlipidemia in his mother; Hypertension in his father and mother.  ROS:   Review of Systems  Constitution: Negative for decreased appetite, fever and weight gain.  HENT: Negative for congestion, ear discharge, hoarse voice and sore throat.   Eyes: Negative for discharge, redness, vision loss in right eye and visual halos.  Cardiovascular: Negative for chest pain, dyspnea on exertion, leg swelling, orthopnea and palpitations.  Respiratory: Negative for cough, hemoptysis, shortness of breath and snoring.   Endocrine: Negative for heat intolerance and polyphagia.  Hematologic/Lymphatic: Negative for bleeding problem. Does not bruise/bleed easily.  Skin: Negative for flushing, nail changes, rash and suspicious lesions.  Musculoskeletal: Negative for arthritis, joint pain, muscle cramps, myalgias, neck pain and stiffness.  Gastrointestinal: Negative for abdominal pain, bowel incontinence, diarrhea and excessive appetite.  Genitourinary: Negative for decreased libido, genital sores and incomplete emptying.  Neurological: Negative for brief paralysis, focal weakness, headaches and loss of balance.  Psychiatric/Behavioral:  Negative for altered mental status, depression and suicidal ideas.  Allergic/Immunologic: Negative for HIV exposure and persistent infections.    EKGs/Labs/Other Studies Reviewed:    The following studies were reviewed today:   EKG:  The ekg ordered today demonstrates sinus rhythm, 77 bpm  TTE 04/01/2022 IMPRESSIONS   1. Left ventricular ejection fraction, by estimation, is 50 to 55%. Left  ventricular ejection fraction by 3D volume is 55 %. Left ventricular  ejection fraction by PLAX is 51 %. The left ventricle has low normal  function. The left ventricle has no  regional wall motion abnormalities. Left ventricular diastolic parameters  are consistent with Grade I diastolic dysfunction (impaired relaxation).  The average left ventricular global longitudinal strain is -22.9 %. The  global longitudinal  strain is  normal.   2. Right ventricular systolic function is normal. The right ventricular  size is normal. There is normal pulmonary artery systolic pressure.   3. Left atrial size was moderately dilated.   4. The mitral valve is normal in structure. Trivial mitral valve  regurgitation. No evidence of mitral stenosis.   5. The aortic valve is tricuspid. Aortic valve regurgitation is not  visualized. No aortic stenosis is present.   6. Aortic dilatation noted. There is mild dilatation of the aortic root,  measuring 39 mm.   7. The inferior vena cava is normal in size with greater than 50%  respiratory variability, suggesting right atrial pressure of 3 mmHg.   FINDINGS   Left Ventricle: Left ventricular ejection fraction, by estimation, is 50  to 55%. Left ventricular ejection fraction by PLAX is 51 %. Left  ventricular ejection fraction by 3D volume is 55 %. The left ventricle has  low normal function. The left ventricle  has no regional wall motion abnormalities. The average left ventricular  global longitudinal strain is -22.9 %. The global longitudinal strain is  normal.  The left ventricular internal cavity size was normal in size.  There is no left ventricular  hypertrophy. Left ventricular diastolic parameters are consistent with  Grade I diastolic dysfunction (impaired relaxation). Indeterminate filling  pressures.   Right Ventricle: The right ventricular size is normal. No increase in  right ventricular wall thickness. Right ventricular systolic function is  normal. There is normal pulmonary artery systolic pressure. The tricuspid  regurgitant velocity is 1.52 m/s, and   with an assumed right atrial pressure of 3 mmHg, the estimated right  ventricular systolic pressure is 12.2 mmHg.   Left Atrium: Left atrial size was moderately dilated.   Right Atrium: Right atrial size was normal in size.   Pericardium: There is no evidence of pericardial effusion.   Mitral Valve: The mitral valve is normal in structure. Trivial mitral  valve regurgitation. No evidence of mitral valve stenosis.   Tricuspid Valve: The tricuspid valve is normal in structure. Tricuspid  valve regurgitation is trivial. No evidence of tricuspid stenosis.   Aortic Valve: The aortic valve is tricuspid. Aortic valve regurgitation is  not visualized. No aortic stenosis is present.   Pulmonic Valve: The pulmonic valve was normal in structure. Pulmonic valve  regurgitation is not visualized. No evidence of pulmonic stenosis.   Aorta: Aortic dilatation noted. There is mild dilatation of the aortic  root, measuring 39 mm.   Venous: The inferior vena cava is normal in size with greater than 50%  respiratory variability, suggesting right atrial pressure of 3 mmHg.    CT calcium scoring 03/17/2022 TECHNIQUE: The patient was scanned on a Siemens Somatom Definition AS scanner. Axial non-contrast 3 mm slices were carried out through the heart. The data set was analyzed on a dedicated work station and scored using the Agatson method.   FINDINGS: Non-cardiac: See separate report from  [facility].   Ascending Aorta: Normal size   Pericardium: Normal   Coronary arteries: Normal origin of left and right coronary arteries. Distribution of arterial calcifications if present, as noted below;   LM 0   LAD 1188   LCx 457   RCA 605   Total 2250   IMPRESSION AND RECOMMENDATION: 1. Coronary calcium score of 2250. This was 98th percentile for age and sex matched control.   2. CAC >300 in LAD, LCx, RCA. CAC-DRS A3/N3.   3. Recommend aspirin and  statin if no contraindication.   4. Recommend cardiology consultation.   5. Continue heart healthy lifestyle and risk factor modification.  Electronically Signed   By: Debbe Odea M.D.   On: 03/18/2022 16:55  Recent Labs: 06/25/2022: ALT 24; BUN 20; Creat 1.18; Hemoglobin 13.5; Magnesium 2.5; Platelets 315; Potassium 4.5; Sodium 139; TSH 1.51  Recent Lipid Panel    Component Value Date/Time   CHOL 136 06/16/2022 0946   TRIG 98 06/16/2022 0946   HDL 51 06/16/2022 0946   CHOLHDL 2.7 06/16/2022 0946   CHOLHDL 3.5 03/03/2022 1530   VLDL 29 10/15/2016 1123   LDLCALC 67 06/16/2022 0946   LDLCALC 84 03/03/2022 1530    Physical Exam:    VS:  BP (!) 148/92   Pulse (!) 58   Ht 5\' 8"  (1.727 m)   Wt 195 lb 12.8 oz (88.8 kg)   SpO2 98%   BMI 29.77 kg/m     Wt Readings from Last 3 Encounters:  09/09/22 195 lb 12.8 oz (88.8 kg)  07/16/22 193 lb 3.2 oz (87.6 kg)  06/25/22 193 lb 12.8 oz (87.9 kg)     GEN: Well nourished, well developed in no acute distress HEENT: Normal NECK: No JVD; No carotid bruits LYMPHATICS: No lymphadenopathy CARDIAC: S1S2 noted,RRR, 2/6 soft midsystolic ejection murmurs, rubs, gallops RESPIRATORY:  Clear to auscultation without rales, wheezing or rhonchi  ABDOMEN: Soft, non-tender, non-distended, +bowel sounds, no guarding. EXTREMITIES: No edema, No cyanosis, no clubbing MUSCULOSKELETAL:  No deformity  SKIN: Warm and dry NEUROLOGIC:  Alert and oriented x 3,  non-focal PSYCHIATRIC:  Normal affect, good insight  ASSESSMENT:    1. Chronic hypertension   2. Prediabetes   3. Coronary artery disease involving native coronary artery of native heart without angina pectoris   4. Hyperlipidemia, mixed     PLAN:    Coronary calcium score is elevated 2250 - asymptomatic we will continue to monitor.  Will keep the patient on the aspirin 81 mg daily and statin for goal less than 55.  Lipid profile done in March 2024 showed total cholesterol 136, triglyceride 98, HDL 51, LDL 67  Will start Amlodipine 5 mg daily.  Continue crestor 40 mg daily.  The patient understands the need to lose weight with diet and exercise. We have discussed specific strategies for this.  The patient is in agreement with the above plan. The patient left the office in stable condition.  The patient will follow up in 6 months or sooner if needed.  Medication Adjustments/Labs and Tests Ordered: Current medicines are reviewed at length with the patient today.  Concerns regarding medicines are outlined above.  No orders of the defined types were placed in this encounter.  Meds ordered this encounter  Medications   amLODipine (NORVASC) 5 MG tablet    Sig: Take 1 tablet (5 mg total) by mouth daily.    Dispense:  90 tablet    Refill:  3    Patient Instructions  Medication Instructions:  Your physician has recommended you make the following change in your medication:  START: Amlodipine 5 mg once daily *If you need a refill on your cardiac medications before your next appointment, please call your pharmacy*   Lab Work: None   Testing/Procedures: None   Follow-Up: At [facility], you and your health needs are our priority.  As part of our continuing mission to provide you with exceptional heart care, we have created designated Provider Care Teams.  These Care Teams include  your primary Cardiologist (physician) and Advanced Practice Providers (APPs -  Physician  Assistants and Nurse Practitioners) who all work together to provide you with the care you need, when you need it.  Your next appointment:   4 month(s)  Provider:   Thomasene Ripple, DO    Adopting a Healthy Lifestyle.  Know what a healthy weight is for you (roughly BMI <25) and aim to maintain this   Aim for 7+ servings of fruits and vegetables daily   65-80+ fluid ounces of water or unsweet tea for healthy kidneys   Limit to max 1 drink of alcohol per day; avoid smoking/tobacco   Limit animal fats in diet for cholesterol and heart health - choose grass fed whenever available   Avoid highly processed foods, and foods high in saturated/trans fats   Aim for low stress - take time to unwind and care for your mental health   Aim for 150 min of moderate intensity exercise weekly for heart health, and weights twice weekly for bone health   Aim for 7-9 hours of sleep daily   When it comes to diets, agreement about the perfect plan isnt easy to find, even among the experts. Experts at the [facility] [employer] developed an idea known as the Healthy Eating Plate. Just imagine a plate divided into logical, healthy portions.   The emphasis is on diet quality:   Load up on vegetables and fruits - one-half of your plate: Aim for color and variety, and remember that potatoes dont count.   Go for whole grains - one-quarter of your plate: Whole wheat, barley, wheat berries, quinoa, oats, brown rice, and foods made with them. If you want pasta, go with whole wheat pasta.   Protein power - one-quarter of your plate: Fish, chicken, beans, and nuts are all healthy, versatile protein sources. Limit red meat.   The diet, however, does go beyond the plate, offering a few other suggestions.   Use healthy plant oils, such as olive, canola, soy, corn, sunflower and peanut. Check the labels, and avoid partially hydrogenated oil, which have unhealthy trans fats.   If youre thirsty, drink  water. Coffee and tea are good in moderation, but skip sugary drinks and limit milk and dairy products to one or two daily servings.   The type of carbohydrate in the diet is more important than the amount. Some sources of carbohydrates, such as vegetables, fruits, whole grains, and beans-are healthier than others.   Finally, stay active  Signed, Thomasene Ripple, DO  09/09/2022 10:41 PM    [facility]

## 2022-09-09 NOTE — Patient Instructions (Signed)
Medication Instructions:  Your physician has recommended you make the following change in your medication:  START: Amlodipine 5 mg once daily *If you need a refill on your cardiac medications before your next appointment, please call your pharmacy*   Lab Work: None   Testing/Procedures: None   Follow-Up: At [facility], you and your health needs are our priority.  As part of our continuing mission to provide you with exceptional heart care, we have created designated Provider Care Teams.  These Care Teams include your primary Cardiologist (physician) and Advanced Practice Providers (APPs -  Physician Assistants and Nurse Practitioners) who all work together to provide you with the care you need, when you need it.  Your next appointment:   4 month(s)  Provider:   Thomasene Ripple, DO

## 2022-09-10 DIAGNOSIS — N138 Other obstructive and reflux uropathy: Secondary | ICD-10-CM | POA: Diagnosis not present

## 2022-09-10 DIAGNOSIS — N401 Enlarged prostate with lower urinary tract symptoms: Secondary | ICD-10-CM | POA: Diagnosis not present

## 2022-09-10 DIAGNOSIS — R339 Retention of urine, unspecified: Secondary | ICD-10-CM | POA: Diagnosis not present

## 2022-09-11 ENCOUNTER — Telehealth: Payer: Self-pay | Admitting: *Deleted

## 2022-09-11 NOTE — Telephone Encounter (Signed)
   Pre-operative Risk Assessment    Patient Name: Gerald [name]  DOB: [birth year] MRN: [id number]      Request for Surgical Clearance    Procedure:  HOLMIUM LASER ENUCLEATION OF PROSTATE  Date of Surgery:  Clearance TBD SOMETIME IN JULY                                Surgeon:  DR. [place] GRIDLEY Surgeon's Group or Practice Name:  [facility] Phone number:  [phone] Fax number:  [phone]   Type of Clearance Requested:   - Medical ; ASA    Type of Anesthesia:  Not Indicated   Additional requests/questions:    [name]   09/11/2022, 10:28 AM

## 2022-09-14 ENCOUNTER — Other Ambulatory Visit: Payer: Self-pay | Admitting: Nurse Practitioner

## 2022-09-14 NOTE — Telephone Encounter (Addendum)
   Patient Name: Gerald [name]  DOB: 15-Mar-1956 MRN: [id number]  Primary Cardiologist: Thomasene Ripple, DO  Chart reviewed as part of pre-operative protocol coverage. Pre-op clearance already addressed by colleagues in earlier phone notes. To summarize recommendations:  - Yes he can be cleared  -Dr. Servando Salina  He can hold asa for 5-7 days prior to procedure and restart when medically safe to do so.  Will route this bundled recommendation to requesting provider via Epic fax function and remove from pre-op pool. Please call with questions.  Sharlene Dory, PA-C 09/14/2022, 7:14 AM

## 2022-09-29 ENCOUNTER — Ambulatory Visit: Payer: Medicare HMO | Admitting: Nurse Practitioner

## 2022-10-01 ENCOUNTER — Ambulatory Visit (INDEPENDENT_AMBULATORY_CARE_PROVIDER_SITE_OTHER): Payer: Medicare HMO | Admitting: Nurse Practitioner

## 2022-10-01 ENCOUNTER — Encounter: Payer: Self-pay | Admitting: Nurse Practitioner

## 2022-10-01 VITALS — BP 116/80 | HR 70 | Temp 97.7°F | Ht 68.0 in | Wt 195.0 lb

## 2022-10-01 DIAGNOSIS — E559 Vitamin D deficiency, unspecified: Secondary | ICD-10-CM

## 2022-10-01 DIAGNOSIS — R7309 Other abnormal glucose: Secondary | ICD-10-CM

## 2022-10-01 DIAGNOSIS — F13930 Sedative, hypnotic or anxiolytic use, unspecified with withdrawal, uncomplicated: Secondary | ICD-10-CM | POA: Diagnosis not present

## 2022-10-01 DIAGNOSIS — G4700 Insomnia, unspecified: Secondary | ICD-10-CM | POA: Diagnosis not present

## 2022-10-01 DIAGNOSIS — Z0001 Encounter for general adult medical examination with abnormal findings: Secondary | ICD-10-CM

## 2022-10-01 DIAGNOSIS — R6889 Other general symptoms and signs: Secondary | ICD-10-CM

## 2022-10-01 DIAGNOSIS — N400 Enlarged prostate without lower urinary tract symptoms: Secondary | ICD-10-CM

## 2022-10-01 DIAGNOSIS — I10 Essential (primary) hypertension: Secondary | ICD-10-CM | POA: Diagnosis not present

## 2022-10-01 DIAGNOSIS — I2510 Atherosclerotic heart disease of native coronary artery without angina pectoris: Secondary | ICD-10-CM

## 2022-10-01 DIAGNOSIS — E663 Overweight: Secondary | ICD-10-CM

## 2022-10-01 DIAGNOSIS — E782 Mixed hyperlipidemia: Secondary | ICD-10-CM

## 2022-10-01 DIAGNOSIS — R7303 Prediabetes: Secondary | ICD-10-CM

## 2022-10-01 DIAGNOSIS — Z8781 Personal history of (healed) traumatic fracture: Secondary | ICD-10-CM

## 2022-10-01 DIAGNOSIS — Z79899 Other long term (current) drug therapy: Secondary | ICD-10-CM

## 2022-10-01 DIAGNOSIS — J4521 Mild intermittent asthma with (acute) exacerbation: Secondary | ICD-10-CM

## 2022-10-01 DIAGNOSIS — E291 Testicular hypofunction: Secondary | ICD-10-CM | POA: Diagnosis not present

## 2022-10-01 DIAGNOSIS — Z0000 Encounter for general adult medical examination without abnormal findings: Secondary | ICD-10-CM

## 2022-10-01 MED ORDER — CLONIDINE HCL 0.1 MG PO TABS: ORAL_TABLET | ORAL | 0 refills | Status: DC

## 2022-10-01 MED ORDER — TRAZODONE HCL 50 MG PO TABS: 50.0000 mg | ORAL_TABLET | Freq: Every day | ORAL | 0 refills | Status: DC

## 2022-10-01 NOTE — Progress Notes (Signed)
MEDICARE AND FOLLOW UP 3 MONTH  Gerald [name] is here for annual well ness visit.  Below references current diagnostics and plan of care  Assessment and Plan:   Encounter for Welcome to Medicare Due annually Health maintenance reviewed   Hypertension Discussed DASH (Dietary Approaches to Stop Hypertension) DASH diet is lower in sodium than a typical American diet. Cut back on foods that are high in saturated fat, cholesterol, and trans fats. Eat more whole-grain foods, fish, poultry, and nuts Remain active and exercise as tolerated daily.  Monitor BP at home-Call if greater than 130/80.  Check CMP/CBC  ASCVD Keep BP and cholesterol well controlled Remain active Monitor weight Cardiology following  Cholesterol Discussed lifestyle modifications. Recommended diet heavy in fruits and veggies, omega 3's. Decrease consumption of animal meats, cheeses, and dairy products. Remain active and exercise as tolerated. Continue to monitor. Check lipids/TSH  Asthma Controlled  SABA PRN Avoid triggers  Hx of Prediabetes/ abnormal glucose Education: Reviewed 'ABCs' of diabetes management  Discussed goals to be met and/or maintained include A1C (<7) Blood pressure (<130/80) Cholesterol (LDL <70) Continue Eye Exam yearly  Continue Dental Exam Q6 mo Discussed dietary recommendations Discussed Physical Activity recommendations Check A1C  Overweight BMI 29 Refer to Nutrition  Discussed appropriate BMI Diet modification. Physical activity. Encouraged/praised to build confidence.  Prostatism/BPH Scheduled cystoscopy 10/20/22 Dr. Henrietta Dine Continue to follow with Urology  Vitamin D deficiency Continue supplement for goal of 60-100 Monitor Vitamin D levels   Testosterone deficency Medication effective - continue  Monitor testosterone levels  Medication Management All medications discussed and reviewed in full. All questions and concerns regarding medications addressed.     Benzo Withdrawal Start Clonidine as directed Discussed withdrawal symptoms Continue to monitor  Insomnia Start trazodone as directed. Discussed good sleep hygiene. Establish bed and wake times. Sleep restriction-only sleep estimated hrs sleep. Bed only for sex and sleep, only sleep when sleepy, out of bed if anxious (stimulus control). Reviewed relaxation techniques, mindful meditations. Expected sleep duration. Addressed worries about not sleeping.   A/P Closed displaced comminuted fracture of shaft of right tibia with routine healing.  Continue to follow with Dr. Marlou [facility]  Orders Placed This Encounter  Procedures   CBC with Differential/Platelet   COMPLETE METABOLIC PANEL WITH GFR   Lipid panel   Hemoglobin A1c   Amb ref to Medical Nutrition Therapy-MNT    Referral Priority:   Routine    Referral Type:   Consultation    Referral Reason:   Specialty Services Required    Requested Specialty:   Nutrition    Number of Visits Requested:   1    Notify office for further evaluation and treatment, questions or concerns if any reported s/s fail to improve.   The patient was advised to call back or seek an in-person evaluation if any symptoms worsen or if the condition fails to improve as anticipated.   Further disposition pending results of labs. Discussed med's effects and SE's.    I discussed the assessment and treatment plan with the patient. The patient was provided an opportunity to ask questions and all were answered. The patient agreed with the plan and demonstrated an understanding of the instructions.  Discussed med's effects and SE's. Screening labs and tests as requested with regular follow-up as recommended.  I provided 35 minutes of face-to-face time during this encounter including counseling, chart review, and critical decision making was preformed.  Today's Plan of Care is based on a patient-centered health care  approach known as shared decision making  - the decisions, tests and treatments allow for patient preferences and values to be balanced with clinical evidence.    Future Appointments  Date Time Provider Department Center  12/31/2022 11:30 AM Lucky Cowboy, MD [facility] None  01/18/2023 11:00 AM Thomasene Ripple, DO [facility] None  04/05/2023 11:30 AM Raynelle Dick, NP [facility] None  07/08/2023 11:00 AM Lucky Cowboy, MD [facility] None  10/04/2023 11:30 AM Adela Glimpse, NP [facility] None    Plan:   During the course of the visit the patient was educated and counseled about appropriate screening and preventive services including:   Pneumococcal vaccine  Prevnar 13 Influenza vaccine Td vaccine Screening electrocardiogram Bone densitometry screening Colorectal cancer screening Diabetes screening Glaucoma screening Nutrition counseling  Advanced directives: requested   ----------------------------------------------------------------------------------------------------------------------  HPI 67 y.o. male  presents for 3 month follow up and annual wellness visit.  He has Hypertension; Hyperlipidemia, mixed; Vitamin D deficiency; Prediabetes; Testosterone Deficiency; Encounter to establish care; Migraine; Overweight (BMI 25.0-29.9); Sinus bradycardia; Prostatism; BPH (benign prostatic hyperplasia); Insomnia; Iron deficiency anemia; Acute medial meniscus tear of right knee; Murmur; ASCVD (arteriosclerotic cardiovascular disease); Chronic hypertension; and Coronary artery disease involving native coronary artery of native heart without angina pectoris on their problem list.  He had a fall from a 10 ft ladder 01/05/2022.   He was transported to [facility] ER where radiographs showed comminuted fracture of right mid fibular diaphysis, foreshortened and mildly angulated right distal tibial diaphysis fracture, anterior dislocation of first through third rays at the MTP joint  He continues to follow with [facility], healing  well.  He also had a maxillary spine fracture with no surgical intervention.  He also had a closed fracture of the nasal septum.  He followed with [facility] and has healed well.    He had an elevation in PSA, followed with [facility].  Has hx of BPH.  Now has scheduled cystoscopy 10/17/2021.    He is trying to wean himself off of Xanax use.  Uses for sleep and panic.  Mother recently passed.    He has history of migraines; doing well, was followed by Dr. Santiago Glad at headache clinic but no longer in network; reports migraines in clusters/spurts, variable. Will have several then none for 3-4 months. Takes lyrica 200 mg BID as prophylactic (reduced from 25/month to current 5-8). Will take ibuprofen for mild which does work 75% of the time. Takes zomig up to twice per week and supplements which fioricet. Also has onzetra for severe that works very well but expensive with insurance. Nurtec didn't help. Has been on current regimen for 4-5 years, no changes and works fairly well, requests we take over prescribing meds for now.   BMI is Body mass index is 29.65 kg/m., he has been working on diet and exercise, He goes to the [facility] 2-3 times a week, also works out in the yard frequently. He monitors weights closely at home. Follows good diet 80+% of the time. He is interested in following with a nutritionalist for further management of overall wellness.   Wt Readings from Last 3 Encounters:  10/01/22 195 lb (88.5 kg)  09/09/22 195 lb 12.8 oz (88.8 kg)  07/16/22 193 lb 3.2 oz (87.6 kg)   HTN predates 1999.  Negative heart cath in 2008. His blood pressure has been controlled at home (120-140s/70-80s),He is currently on Moexipril 15 mg daily.  today their BP is BP: 116/80  BP Readings from Last  3 Encounters:  10/01/22 116/80  09/09/22 (!) 148/92  07/16/22 128/78   reports more stress recently friend suddenly dies, taking care of mom  He does workout. He denies chest pain, shortness of  breath, dizziness.   He is on cholesterol medication (rosuvastatin 20 mg daily) and denies myalgias. His cholesterol is at goal. The cholesterol last visit was:   Lab Results  Component Value Date   CHOL 136 06/16/2022   HDL 51 06/16/2022   LDLCALC 67 06/16/2022   TRIG 98 06/16/2022   CHOLHDL 2.7 06/16/2022    He has been working on diet and exercise for hx of intermittent prediabetes (A1c 5.7% / 2011), and denies increased appetite, nausea, paresthesia of the feet, polydipsia, polyuria, visual disturbances and vomiting. Last A1C in the office was:  Lab Results  Component Value Date   HGBA1C 6.1 (H) 06/25/2022    Patient is on Vitamin D supplement for deficiency and at goal last check:   Lab Results  Component Value Date   VD25OH 72 06/25/2022     He has a history of testosterone deficiency (2005) and is on testosterone replacement (gel). He states that the testosterone helps with his energy, libido, muscle mass. Lab Results  Component Value Date   TESTOSTERONE 351 06/25/2022    Current Outpatient Medications on File Prior to Visit  Medication Sig Dispense Refill   albuterol (VENTOLIN HFA) 108 (90 Base) MCG/ACT inhaler Inhale 2 puffs into the lungs every 6 (six) hours as needed for wheezing or shortness of breath. 8 g 2   ALPRAZolam (XANAX) 0.5 MG tablet TAKE 1/2-1 TAB DAILY IF NEEDED FOR ANXIETY ATTACK (MAX 5 DAYS/WEEK TO AVOID ADDICTION/DEMENTIA) 30 tablet 0   amLODipine (NORVASC) 5 MG tablet Take 1 tablet (5 mg total) by mouth daily. 90 tablet 3   Ascorbic Acid (VITAMIN C) 1000 MG tablet Take 1,000 mg by mouth 2 (two) times daily.     aspirin EC 81 MG tablet Take 81 mg by mouth daily. Swallow whole.     Cholecalciferol (VITAMIN D-3) 5000 UNITS TABS Take 5,000 Units by mouth daily.      clobetasol cream (TEMOVATE) 0.05 % APPLY TO RASH TWICE DAILY AS NEEDED 60 g 3   [facility] STOOL SOFTENER/LAXATIVE 8.6-50 MG tablet Take 1 tablet by mouth 2 (two) times daily.     fexofenadine  (ALLEGRA) 180 MG tablet Take by mouth.     finasteride (PROSCAR) 5 MG tablet Take 5 mg by mouth daily.     Flaxseed, Linseed, (FLAX SEED OIL PO) Take 1,000 mg by mouth 3 (three) times daily.      fluticasone (FLONASE) 50 MCG/ACT nasal spray Place 1 spray into both nostrils daily. 11.1 mL 2   Fluticasone-Salmeterol (WIXELA INHUB IN) Inhale into the lungs.     Glucosamine-Chondroitin (GLUCOSAMINE CHONDR COMPLEX PO) Take by mouth. Take 2 tablets daily     guaiFENesin (MUCINEX PO) Take by mouth.     ibuprofen (ADVIL) 600 MG tablet Take 1 tablet (600 mg total) by mouth every 6 (six) hours as needed. 30 tablet 0   Magnesium 500 MG TABS Take 1 tablet (500 mg total) by mouth daily. 30 tablet 0   Melatonin 10 MG CAPS Take by mouth Nightly.     montelukast (SINGULAIR) 10 MG tablet TAKE 1 TABLET BY MOUTH EVERY DAY 90 tablet 3   Omega-3 Fatty Acids (FISH OIL PO) Take 1,000 mg by mouth 3 (three) times daily.      pregabalin (LYRICA) 200  MG capsule Take 1 capsule 2 x /day 180 capsule 3   Probiotic Product (ALIGN) 4 MG CAPS Take 1 capsule daily for Probiotic Benefit 90 capsule 1   rosuvastatin (CRESTOR) 40 MG tablet TAKE 1 TABLET BY MOUTH EVERY DAY FOR CHOLESTEROL 90 tablet 3   tamsulosin (FLOMAX) 0.4 MG CAPS capsule Take 0.4 mg by mouth.     Testosterone 10 MG/ACT (2%) GEL APPLY THE CONTENTS OF 4    PUMP ACTUATIONS DAILY TO   THIGHS 360 g 1   vitamin B-12 (CYANOCOBALAMIN) 100 MCG tablet Take 100 mcg by mouth daily.     zolmitriptan (ZOMIG) 5 MG tablet TAKE ONE TABLET BY MOUTH AT ONSET OF HEADACHE; MAY REPEAT ONE TABLET IN 2 HOURS IF NEEDED. 24 tablet 2   dexamethasone (DECADRON) 4 MG tablet Take 1 tab 3 x day - 3 days, then 2 x day - 3 days, then 1 tab daily 20 tablet 0   moexipril (UNIVASC) 15 MG tablet TAKE 1 TABLET (15 MG TOTAL) BY MOUTH DAILY. FOR BLOOD PRESSURE 90 tablet 1   moxifloxacin (AVELOX) 400 MG tablet Take  1 tablet   Daily  with  Food for Infection 10 tablet 0   No current  facility-administered medications on file prior to visit.   Immunization History  Administered Date(s) Administered   Influenza Inj Mdck Quad Pf 01/04/2019   Influenza Inj Mdck Quad With Preservative 12/27/2017   Influenza Split 01/08/2014   Influenza Whole 01/05/2013   Influenza, High Dose Seasonal PF 02/13/2022   Influenza,inj,Quad PF,6+ Mos 01/15/2021   Influenza-Unspecified 01/05/2015   Moderna SARS-COV2 Booster Vaccination 02/13/2022   PFIZER(Purple Top)SARS-COV-2 Vaccination 06/05/2019, 06/27/2019, 03/09/2020   PNEUMOCOCCAL CONJUGATE-20 08/22/2021   PPD Test 01/08/2014, 01/14/2015, 12/12/2015, 01/12/2017, 02/08/2018, 04/27/2019, 05/08/2020   Pfizer Covid-19 Vaccine Bivalent Booster 56yrs & up 03/06/2021   Respiratory Syncytial Virus Vaccine,Recomb Aduvanted(Arexvy) 02/13/2022   Td 08/22/2021   Tdap 06/15/2008   Health Maintenance  Topic Date Due   Hepatitis C Screening  Never done   Zoster Vaccines- Shingrix (1 of 2) Never done   COVID-19 Vaccine (5 - 2023-24 season) 04/10/2022   INFLUENZA VACCINE  11/05/2022   Medicare Annual Wellness (AWV)  10/01/2023   Colonoscopy  08/01/2024   DTaP/Tdap/Td (3 - Td or Tdap) 08/23/2031   Pneumonia Vaccine 27+ Years old  Completed   HPV VACCINES  Aged Out   Eye Doctor: Dr. Emily Filbert last visit 01/2022 Dentist: Dr. Jon Billings last visit 12/2021 Dermatology;  Defers - no new issues   Allergies: No Known Allergies   Medical History:  Past Medical History:  Diagnosis Date   Acute medial meniscus tear of right knee 02/07/2021   Headache    Hyperlipidemia    Hypertension    Pre-diabetes    no meds   Vitamin D deficiency    Family history- Reviewed and unchanged Social history- Reviewed and unchanged   Review of Systems:  Review of Systems  Constitutional:  Negative for chills, fever, malaise/fatigue and weight loss.  HENT:  Negative for congestion, hearing loss, sinus pain, sore throat and tinnitus.   Eyes:  Negative for blurred  vision and double vision.  Respiratory:  Negative for cough, sputum production, shortness of breath and wheezing.   Cardiovascular:  Negative for chest pain, palpitations, orthopnea, claudication and leg swelling.  Gastrointestinal:  Negative for abdominal pain, blood in stool, constipation, diarrhea, heartburn, melena, nausea and vomiting.  Genitourinary: Negative.  Negative for dysuria and urgency.  Musculoskeletal:  Negative for  back pain, falls, joint pain and myalgias.  Skin:  Negative for rash.  Neurological:  Positive for headaches (improved, stable). Negative for dizziness, tingling, sensory change and weakness.  Endo/Heme/Allergies:  Negative for environmental allergies and polydipsia.  Psychiatric/Behavioral:  Negative for depression and suicidal ideas. The patient is not nervous/anxious and does not have insomnia (occasional when stressed).   All other systems reviewed and are negative.  MEDICARE WELLNESS OBJECTIVES: Physical activity:   Cardiac risk factors:   Depression/mood screen:      10/01/2022    1:15 PM  Depression screen PHQ 2/9  Decreased Interest 0  Down, Depressed, Hopeless 0  PHQ - 2 Score 0    ADLs:     10/01/2022    1:15 PM 06/24/2022   11:39 PM  In your present state of health, do you have any difficulty performing the following activities:  Hearing? 0 0  Vision? 0 0  Difficulty concentrating or making decisions? 0 0  Walking or climbing stairs? 0 0  Dressing or bathing? 0 0  Doing errands, shopping? 0 0     Cognitive Testing  Alert? Yes  Normal Appearance?Yes  Oriented to person? Yes  Place? Yes   Time? Yes  Recall of three objects?  Yes  Can perform simple calculations? Yes  Displays appropriate judgment?Yes  Can read the correct time from a watch face?Yes  EOL planning: Does Patient Have a Medical Advance Directive?: Yes Type of Advance Directive: Living will     Physical Exam: BP 116/80   Pulse 70   Temp 97.7 F (36.5 C)   Ht 5\' 8"   (1.727 m)   Wt 195 lb (88.5 kg)   SpO2 99%   BMI 29.65 kg/m  Wt Readings from Last 3 Encounters:  10/01/22 195 lb (88.5 kg)  09/09/22 195 lb 12.8 oz (88.8 kg)  07/16/22 193 lb 3.2 oz (87.6 kg)   General Appearance: Well nourished, in no apparent distress. Eyes: PERRLA, EOMs, conjunctiva no swelling or erythema Sinuses: No Frontal/maxillary tenderness ENT/Mouth: Ext aud canals clear, TMs without erythema, bulging. No erythema, swelling, or exudate on post pharynx.  Tonsils not swollen or erythematous. Hearing normal.  Neck: Supple, thyroid normal.  Respiratory: Respiratory effort normal, BS equal bilaterally without rales, rhonchi, wheezing or stridor.  Cardio: RRR with no MRGs. Brisk peripheral pulses without edema.  Abdomen: Soft, + BS.  Non tender, no guarding, rebound, hernias, masses. Lymphatics: Non tender without lymphadenopathy.  Musculoskeletal: Full ROM, 5/5 strength, Normal gait.  Skin: Warm, dry . Small 1 cm laceration of right lower arm, small redness, no s/s of infection Neuro: Cranial nerves intact. No cerebellar symptoms.  Psych: Awake and oriented X 3, normal affect, Insight and Judgment appropriate.    Medicare Attestation I have personally reviewed: The patient's medical and social history Their use of alcohol, tobacco or illicit drugs Their current medications and supplements The patient's functional ability including ADLs,fall risks, home safety risks, cognitive, and hearing and visual impairment Diet and physical activities Evidence for depression or mood disorders  The patient's weight, height, BMI, and visual acuity have been recorded in the chart.  I have made referrals, counseling, and provided education to the patient based on review of the above and I have provided the patient with a written personalized care plan for preventive services.     Adela Glimpse, NP 1:18 PM [facility]

## 2022-10-01 NOTE — Patient Instructions (Signed)

## 2022-10-02 ENCOUNTER — Encounter: Payer: Self-pay | Admitting: Nurse Practitioner

## 2022-10-02 LAB — COMPLETE METABOLIC PANEL WITH GFR
AG Ratio: 1.9 (calc) (ref 1.0–2.5)
ALT: 22 U/L (ref 9–46)
AST: 23 U/L (ref 10–35)
Albumin: 4.3 g/dL (ref 3.6–5.1)
Alkaline phosphatase (APISO): 86 U/L (ref 35–144)
BUN: 21 mg/dL (ref 7–25)
CO2: 27 mmol/L (ref 20–32)
Calcium: 9.6 mg/dL (ref 8.6–10.3)
Chloride: 104 mmol/L (ref 98–110)
Creat: 1.09 mg/dL (ref 0.70–1.35)
Globulin: 2.3 g/dL (calc) (ref 1.9–3.7)
Glucose, Bld: 97 mg/dL (ref 65–99)
Potassium: 4.1 mmol/L (ref 3.5–5.3)
Sodium: 139 mmol/L (ref 135–146)
Total Bilirubin: 0.3 mg/dL (ref 0.2–1.2)
Total Protein: 6.6 g/dL (ref 6.1–8.1)
eGFR: 75 mL/min/{1.73_m2} (ref >=60)

## 2022-10-02 LAB — LIPID PANEL
Cholesterol: 135 mg/dL (ref <200)
HDL: 57 mg/dL (ref >=40)
LDL Cholesterol (Calc): 56 mg/dL (calc)
Non-HDL Cholesterol (Calc): 78 mg/dL (calc) (ref <130)
Total CHOL/HDL Ratio: 2.4 (calc) (ref <5.0)
Triglycerides: 135 mg/dL (ref <150)

## 2022-10-02 LAB — CBC WITH DIFFERENTIAL/PLATELET
Absolute Monocytes: 616 cells/uL (ref 200–950)
Basophils Absolute: 49 cells/uL (ref 0–200)
Basophils Relative: 0.7 %
Eosinophils Absolute: 182 cells/uL (ref 15–500)
Eosinophils Relative: 2.6 %
HCT: 39.7 % (ref 38.5–50.0)
Hemoglobin: 12.3 g/dL — ABNORMAL LOW (ref 13.2–17.1)
Lymphs Abs: 1043 cells/uL (ref 850–3900)
MCH: 25.3 pg — ABNORMAL LOW (ref 27.0–33.0)
MCHC: 31 g/dL — ABNORMAL LOW (ref 32.0–36.0)
MCV: 81.7 fL (ref 80.0–100.0)
MPV: 9.9 fL (ref 7.5–12.5)
Monocytes Relative: 8.8 %
Neutro Abs: 5110 cells/uL (ref 1500–7800)
Neutrophils Relative %: 73 %
Platelets: 305 10*3/uL (ref 140–400)
RBC: 4.86 10*6/uL (ref 4.20–5.80)
RDW: 14.5 % (ref 11.0–15.0)
Total Lymphocyte: 14.9 %
WBC: 7 10*3/uL (ref 3.8–10.8)

## 2022-10-02 LAB — HEMOGLOBIN A1C
Hgb A1c MFr Bld: 6 % of total Hgb — ABNORMAL HIGH (ref <5.7)
Mean Plasma Glucose: 126 mg/dL
eAG (mmol/L): 7 mmol/L

## 2022-10-10 ENCOUNTER — Other Ambulatory Visit: Payer: Self-pay | Admitting: Pulmonary Disease

## 2022-10-13 ENCOUNTER — Encounter: Payer: Self-pay | Admitting: Nurse Practitioner

## 2022-10-20 ENCOUNTER — Other Ambulatory Visit: Payer: Self-pay | Admitting: Nurse Practitioner

## 2022-10-20 DIAGNOSIS — N400 Enlarged prostate without lower urinary tract symptoms: Secondary | ICD-10-CM | POA: Diagnosis not present

## 2022-10-20 DIAGNOSIS — N3289 Other specified disorders of bladder: Secondary | ICD-10-CM | POA: Diagnosis not present

## 2022-10-20 DIAGNOSIS — F13930 Sedative, hypnotic or anxiolytic use, unspecified with withdrawal, uncomplicated: Secondary | ICD-10-CM

## 2022-10-20 DIAGNOSIS — N138 Other obstructive and reflux uropathy: Secondary | ICD-10-CM | POA: Diagnosis not present

## 2022-10-20 DIAGNOSIS — N411 Chronic prostatitis: Secondary | ICD-10-CM | POA: Diagnosis not present

## 2022-10-20 DIAGNOSIS — E782 Mixed hyperlipidemia: Secondary | ICD-10-CM | POA: Diagnosis not present

## 2022-10-20 DIAGNOSIS — R338 Other retention of urine: Secondary | ICD-10-CM | POA: Diagnosis not present

## 2022-10-20 DIAGNOSIS — J45909 Unspecified asthma, uncomplicated: Secondary | ICD-10-CM | POA: Diagnosis not present

## 2022-10-20 DIAGNOSIS — N35911 Unspecified urethral stricture, male, meatal: Secondary | ICD-10-CM | POA: Diagnosis not present

## 2022-10-20 DIAGNOSIS — I2510 Atherosclerotic heart disease of native coronary artery without angina pectoris: Secondary | ICD-10-CM | POA: Diagnosis not present

## 2022-10-20 DIAGNOSIS — N35912 Unspecified bulbous urethral stricture, male: Secondary | ICD-10-CM | POA: Diagnosis not present

## 2022-10-20 DIAGNOSIS — N401 Enlarged prostate with lower urinary tract symptoms: Secondary | ICD-10-CM | POA: Diagnosis not present

## 2022-10-21 DIAGNOSIS — N35911 Unspecified urethral stricture, male, meatal: Secondary | ICD-10-CM | POA: Diagnosis not present

## 2022-10-21 DIAGNOSIS — E782 Mixed hyperlipidemia: Secondary | ICD-10-CM | POA: Diagnosis not present

## 2022-10-21 DIAGNOSIS — J45909 Unspecified asthma, uncomplicated: Secondary | ICD-10-CM | POA: Diagnosis not present

## 2022-10-21 DIAGNOSIS — N35912 Unspecified bulbous urethral stricture, male: Secondary | ICD-10-CM | POA: Diagnosis not present

## 2022-10-21 DIAGNOSIS — N138 Other obstructive and reflux uropathy: Secondary | ICD-10-CM | POA: Diagnosis not present

## 2022-10-21 DIAGNOSIS — I2510 Atherosclerotic heart disease of native coronary artery without angina pectoris: Secondary | ICD-10-CM | POA: Diagnosis not present

## 2022-10-21 DIAGNOSIS — R338 Other retention of urine: Secondary | ICD-10-CM | POA: Diagnosis not present

## 2022-10-21 DIAGNOSIS — N411 Chronic prostatitis: Secondary | ICD-10-CM | POA: Diagnosis not present

## 2022-10-21 DIAGNOSIS — N3289 Other specified disorders of bladder: Secondary | ICD-10-CM | POA: Diagnosis not present

## 2022-10-21 DIAGNOSIS — N401 Enlarged prostate with lower urinary tract symptoms: Secondary | ICD-10-CM | POA: Diagnosis not present

## 2022-10-22 ENCOUNTER — Encounter: Payer: Self-pay | Admitting: Internal Medicine

## 2022-10-28 ENCOUNTER — Other Ambulatory Visit: Payer: Self-pay | Admitting: Nurse Practitioner

## 2022-10-28 DIAGNOSIS — F13930 Sedative, hypnotic or anxiolytic use, unspecified with withdrawal, uncomplicated: Secondary | ICD-10-CM

## 2022-10-29 ENCOUNTER — Other Ambulatory Visit: Payer: Self-pay | Admitting: Nurse Practitioner

## 2022-10-29 DIAGNOSIS — G43009 Migraine without aura, not intractable, without status migrainosus: Secondary | ICD-10-CM

## 2022-10-30 ENCOUNTER — Other Ambulatory Visit: Payer: Self-pay | Admitting: Nurse Practitioner

## 2022-10-30 ENCOUNTER — Other Ambulatory Visit: Payer: Self-pay | Admitting: Internal Medicine

## 2022-10-30 DIAGNOSIS — G43009 Migraine without aura, not intractable, without status migrainosus: Secondary | ICD-10-CM

## 2022-10-30 DIAGNOSIS — F13930 Sedative, hypnotic or anxiolytic use, unspecified with withdrawal, uncomplicated: Secondary | ICD-10-CM

## 2022-10-30 MED ORDER — CLONIDINE HCL 0.1 MG PO TABS: ORAL_TABLET | ORAL | 0 refills | Status: AC

## 2022-10-30 MED ORDER — PREGABALIN 200 MG PO CAPS: ORAL_CAPSULE | ORAL | 1 refills | Status: AC

## 2022-11-02 ENCOUNTER — Telehealth: Payer: Self-pay

## 2022-11-02 DIAGNOSIS — G43009 Migraine without aura, not intractable, without status migrainosus: Secondary | ICD-10-CM

## 2022-11-02 NOTE — Telephone Encounter (Signed)
PA completed and submitted for Zolmitriptan

## 2022-11-05 ENCOUNTER — Encounter: Payer: Self-pay | Admitting: Cardiology

## 2022-11-05 MED ORDER — ZOLMITRIPTAN 5 MG PO TABS: ORAL_TABLET | ORAL | 2 refills | Status: AC

## 2022-11-05 NOTE — Telephone Encounter (Signed)
PA for Zolmitriptan approved.

## 2022-11-05 NOTE — Addendum Note (Signed)
Addended by: Dionicio Stall on: 11/05/2022 02:40 PM   Modules accepted: Orders

## 2022-11-05 NOTE — Telephone Encounter (Signed)
Dr. Servando Salina is out of the office today and tomorrow. Please defer to DOD.

## 2022-11-06 ENCOUNTER — Other Ambulatory Visit: Payer: Self-pay

## 2022-11-06 ENCOUNTER — Other Ambulatory Visit: Payer: Self-pay | Admitting: Nurse Practitioner

## 2022-11-06 ENCOUNTER — Telehealth: Payer: Self-pay | Admitting: Nurse Practitioner

## 2022-11-06 DIAGNOSIS — G4700 Insomnia, unspecified: Secondary | ICD-10-CM

## 2022-11-06 MED ORDER — TRAZODONE HCL 150 MG PO TABS: 150.0000 mg | ORAL_TABLET | Freq: Every day | ORAL | 0 refills | Status: DC

## 2022-11-06 NOTE — Telephone Encounter (Signed)
Pt is requesting refill on Trazodone. [facility]s [facility] to [facility] in [place]. Pt said the 50 mg, 1 time a day did not work, so [place] started having him take the 50 mg 3 time a day. Idk if you want to change it in the computer or whaaaa.

## 2022-11-06 NOTE — Telephone Encounter (Signed)
From the cardiology point of view he does not need antibiotics.  Please ask his orthopedic specialist if he needs antibiotics for his titanium rod.

## 2022-11-08 ENCOUNTER — Encounter: Payer: Self-pay | Admitting: Internal Medicine

## 2022-11-23 ENCOUNTER — Other Ambulatory Visit: Payer: Self-pay | Admitting: Cardiology

## 2022-11-23 DIAGNOSIS — E782 Mixed hyperlipidemia: Secondary | ICD-10-CM

## 2022-12-27 ENCOUNTER — Other Ambulatory Visit: Payer: Self-pay | Admitting: Internal Medicine

## 2022-12-27 DIAGNOSIS — G4700 Insomnia, unspecified: Secondary | ICD-10-CM

## 2022-12-31 ENCOUNTER — Encounter: Payer: Self-pay | Admitting: Internal Medicine

## 2022-12-31 ENCOUNTER — Ambulatory Visit (INDEPENDENT_AMBULATORY_CARE_PROVIDER_SITE_OTHER): Payer: Medicare HMO | Admitting: Internal Medicine

## 2022-12-31 VITALS — BP 138/80 | HR 53 | Temp 97.9°F | Resp 16 | Ht 68.0 in | Wt 190.2 lb

## 2022-12-31 DIAGNOSIS — E559 Vitamin D deficiency, unspecified: Secondary | ICD-10-CM

## 2022-12-31 DIAGNOSIS — E782 Mixed hyperlipidemia: Secondary | ICD-10-CM | POA: Diagnosis not present

## 2022-12-31 DIAGNOSIS — R7309 Other abnormal glucose: Secondary | ICD-10-CM | POA: Diagnosis not present

## 2022-12-31 DIAGNOSIS — Z23 Encounter for immunization: Secondary | ICD-10-CM | POA: Diagnosis not present

## 2022-12-31 DIAGNOSIS — E349 Endocrine disorder, unspecified: Secondary | ICD-10-CM

## 2022-12-31 DIAGNOSIS — Z79899 Other long term (current) drug therapy: Secondary | ICD-10-CM

## 2022-12-31 DIAGNOSIS — I10 Essential (primary) hypertension: Secondary | ICD-10-CM | POA: Diagnosis not present

## 2022-12-31 LAB — CBC WITH DIFFERENTIAL/PLATELET
Absolute Monocytes: 588 cells/uL (ref 200–950)
Basophils Absolute: 61 cells/uL (ref 0–200)
Basophils Relative: 1.3 %
Eosinophils Absolute: 80 cells/uL (ref 15–500)
Eosinophils Relative: 1.7 %
HCT: 43.4 % (ref 38.5–50.0)
Hemoglobin: 13.3 g/dL (ref 13.2–17.1)
Lymphs Abs: 757 cells/uL — ABNORMAL LOW (ref 850–3900)
MCH: 24.4 pg — ABNORMAL LOW (ref 27.0–33.0)
MCHC: 30.6 g/dL — ABNORMAL LOW (ref 32.0–36.0)
MCV: 79.6 fL — ABNORMAL LOW (ref 80.0–100.0)
MPV: 9.8 fL (ref 7.5–12.5)
Monocytes Relative: 12.5 %
Neutro Abs: 3215 cells/uL (ref 1500–7800)
Neutrophils Relative %: 68.4 %
Platelets: 323 10*3/uL (ref 140–400)
RBC: 5.45 10*6/uL (ref 4.20–5.80)
RDW: 14.8 % (ref 11.0–15.0)
Total Lymphocyte: 16.1 %
WBC: 4.7 10*3/uL (ref 3.8–10.8)

## 2022-12-31 MED ORDER — IPRATROPIUM BROMIDE 0.06 % NA SOLN: NASAL | 3 refills | Status: DC

## 2022-12-31 NOTE — Patient Instructions (Signed)

## 2022-12-31 NOTE — Progress Notes (Signed)
Future Appointments  Date Time Provider Department  12/31/2022                6 mo ov  11:30 AM Lucky Cowboy, MD [facility]  01/18/2023 11:00 AM Thomasene Ripple, DO [facility]  04/05/2023               9 mo  11:30 AM Raynelle Dick, NP [facility]  07/08/2023                  cpe 11:00 AM Lucky Cowboy, MD Merlene Pulling  10/04/2023                wellness 11:30 AM Adela Glimpse, NP [facility]    History of Present Illness:       This very nice 67 y.o. [name] presents for 6 month follow up with HTN, HLD, Pre-Diabetes and Vitamin D Deficiency.        Patient is treated for HTN since 1999 & BP has been controlled at home. Today's BP is  at goal  -  138/80 .  In 2008,  patient had a negative Heart Cath.  He has had no complaints of any cardiac type chest pain, palpitations, dyspnea Pollyann Kennedy /PND, dizziness, claudication or dependent edema.        Hyperlipidemia is controlled with diet & Rosuvastatin. Patient denies myalgias or other med SE's. Last Lipids were at goal :  Lab Results  Component Value Date   CHOL 135 10/01/2022   HDL 57 10/01/2022   LDLCALC 56 10/01/2022   TRIG 135 10/01/2022   CHOLHDL 2.4 10/01/2022     Also, the patient has history of  PreDiabetes (A1c 5.7% /2011)  and has had no symptoms of reactive hypoglycemia, diabetic polys, paresthesias or visual blurring.  Last A1c was Normal & at goal :  Lab Results  Component Value Date   HGBA1C 6.0 (H) 10/01/2022                                             Patient has hx/o Low T  since 2005 & has been on replacement with Testosterone gel with improved stamina & sense of well being.           Further, the patient also has history of Vitamin D Deficiency ("48" on Tx in 2008)  and supplements vitamin D without any suspected side-effects. Last vitamin D was not at  goal (70-100) :  Lab Results  Component Value Date   VD25OH 72 06/25/2022       Current Outpatient Medications  Medication Instructions   albuterol   HFA   inhaler 2   Inhalation Every 6 hours PRN   ALPRAZolam   0.5 MG tablet TAKE 1/2-1 TAB DAILY    amLODipine  5 mg Daily   aspirin EC  81 mg Daily, Swallow whole.   clobetasol cream  0.05 % APPLY TO RASH TWICE DAILY AS NEEDED   cloNIDine  0.1 MG tablet TAKE 1-2 TABS EVERY 45-60 MINS AS NEEDED, MAX 4 DOSES, UNTIL SYMPTOMS RESOLVE   LAXATIVE 8.6-50 MG tablet 1 tablet 2 times daily   fexofenadine  180 MG tablet Oral   finasteride 5 mg Daily   FLAX SEED OIL  1,000 mg   3 times daily   FLONASE nasal spray 1 spray, Each Nare, Daily   Fluticasone-Salmeterol  Inhalation   Glucosamine-Chondroitin  Take 2 tablets daily    MUCINEX     ibuprofen 600 mg, Every 6 hours PRN   Magnesium500 mg Daily   Melatonin 10 MG CAPS  Nightly - one time   montelukast 10 mg, Daily   Omega-3  FISH OIL  1,000 mg 3 times daily   pregabalin  200 MG capsule Take  1 capsule  2 to 3 x /day  for Pain   ALIGN 4 MG CAPS Take 1 capsule daily    rosuvastatin 40 MG tablet TAKE 1 TABLET  EVERY DAY    tamsulosin 0.4 mg Daily   Testosterone 10 MG/ACT (2%) GEL APPLY  4    PUMPS DAILY   traZODone 150 MG tablet Take  1/2- tablet 1-2 hrs  before Bedtime   vitamin B-12100 mcg Daily   vitamin C  1,000 mg 2 times daily   Vitamin D  5,000 Units Daily   WIXELA 500-50 MCG 1 Inhalation 2 times daily   Zolmitriptan 5 MG tablet TAKE 1 TAB  AT ONSET OF HA    PMHx:   Past Medical History:  Diagnosis Date   Acute medial meniscus tear of right knee 02/07/2021   Headache    Hyperlipidemia    Hypertension    Pre-diabetes    no meds   Vitamin D deficiency     Immunization History  Administered Date(s) Administered   Influenza Inj Mdck Quad  01/04/2019   Influenza Inj Mdck Quad  12/27/2017   Influenza Split 01/08/2014   Influenza Whole 01/05/2013   Influenza,inj,Quad  01/15/2021   Influenza-Unspecified 01/05/2015   PFIZER-SARS-COV-2 Vacc 06/05/2019, 06/27/2019, 03/09/2020   PNEUMOCOCCAL CONJUGATE-20 08/22/2021   PPD Test 01/12/2017, 02/08/2018,  04/27/2019, 05/08/2020   Pfizer Covid-19 Vaccine Bivalent  03/06/2021   Td 08/22/2021   Tdap 06/15/2008    Past Surgical History:  Procedure Laterality Date   ESOPHAGOGASTRODUODENOSCOPY     KNEE ARTHROSCOPY WITH MEDIAL MENISECTOMY Right 02/20/2021   Procedure: KNEE ARTHROSCOPY WITH MEDIAL MENISECTOMY;  Surgeon: Salvatore Marvel, MD;  Location: [facility];  Service: Orthopedics;  Laterality: Right;   KNEE SURGERY     left   ORIF ACETABULAR FRACTURE     left arm 2 times    FHx:    Reviewed / unchanged  SHx:    Reviewed / unchanged   Systems Review:  Constitutional: Denies fever, chills, wt changes, headaches, insomnia, fatigue, night sweats, change in appetite. Eyes: Denies redness, blurred vision, diplopia, discharge, itchy, watery eyes.  ENT: Denies discharge, congestion, post nasal drip, epistaxis, sore throat, earache, hearing loss, dental pain, tinnitus, vertigo, sinus pain, snoring.  CV: Denies chest pain, palpitations, irregular heartbeat, syncope, dyspnea, diaphoresis, orthopnea, PND, claudication or edema. Respiratory: denies cough, dyspnea, DOE, pleurisy, hoarseness, laryngitis, wheezing.  Gastrointestinal: Denies dysphagia, odynophagia, heartburn, reflux, water brash, abdominal pain or cramps, nausea, vomiting, bloating, diarrhea, constipation, hematemesis, melena, hematochezia  or hemorrhoids. Genitourinary: Denies dysuria, frequency, urgency, nocturia, hesitancy, discharge, hematuria or flank pain. Musculoskeletal: Denies arthralgias, myalgias, stiffness, jt. swelling, pain, limping or strain/sprain.  Skin: Denies pruritus, rash, hives, warts, acne, eczema or change in skin lesion(s). Neuro: No weakness, tremor, incoordination, spasms, paresthesia or pain. Psychiatric: Denies confusion, memory loss or sensory loss. Endo: Denies change in weight, skin or hair change.  Heme/Lymph: No excessive bleeding, bruising or enlarged lymph nodes.  Physical  Exam  There were no vitals taken for this visit.  Appears  well nourished, well groomed  and in no distress.  Eyes: PERRLA, EOMs,  conjunctiva no swelling or erythema. Sinuses: No frontal/maxillary tenderness ENT/Mouth: EAC's clear, TM's nl w/o erythema, bulging. Nares clear w/o erythema, swelling, exudates. Oropharynx clear without erythema or exudates. Oral hygiene is good. Tongue normal, non obstructing. Hearing intact.  Neck: Supple. Thyroid not palpable. Car 2+/2+ without bruits, nodes or JVD. Chest: Respirations nl with BS clear & equal w/o rales, rhonchi, wheezing or stridor.  Cor: Heart sounds normal w/ regular rate and rhythm without sig. murmurs, gallops, clicks or rubs. Peripheral pulses normal and equal  without edema.  Abdomen: Soft & bowel sounds normal. Non-tender w/o guarding, rebound, hernias, masses or organomegaly.  Lymphatics: Unremarkable.  Musculoskeletal: Full ROM all peripheral extremities, joint stability, 5/5 strength and normal gait.  Skin: Warm, dry without exposed rashes, lesions or ecchymosis apparent.  Neuro: Cranial nerves intact, reflexes equal bilaterally. Sensory-motor testing grossly intact. Tendon reflexes grossly intact.  Pysch: Alert & oriented x 3.  Insight and judgement nl & appropriate. No ideations.  Assessment and Plan:  1. Essential hypertension  - Continue medication, monitor blood pressure at home.  - Continue DASH diet.  Reminder to go to the ER if any CP,  SOB, nausea, dizziness, severe HA, changes vision/speech.  - CBC with Differential/Platelet - COMPLETE METABOLIC PANEL WITH GFR - Magnesium - TSH  2. Hyperlipidemia, mixed  - Continue diet/meds, exercise,& lifestyle modifications.  - Continue monitor periodic cholesterol/liver & renal functions   - Lipid panel - TSH  3. Abnormal glucose  - Continue diet, exercise  - Lifestyle modifications.  - Monitor appropriate labs.  - Hemoglobin A1c - Insulin, random  4. Vitamin D  deficiency  - Continue supplementation  - VITAMIN D 25 Hydroxy   5. Testosterone Deficiency  - Testosterone  6. Medication management  - CBC with Differential/Platelet - COMPLETE METABOLIC PANEL WITH GFR - Magnesium - Lipid panel - TSH - Hemoglobin A1c - Insulin, random - VITAMIN D 25 Hydroxy         Discussed  regular exercise, BP monitoring, weight control to achieve / maintain BMI less than 25 and discussed med and SE's. Recommended labs to assess and monitor clinical status with further disposition pending results of labs.  I discussed the assessment and treatment plan with the patient. The patient was provided an opportunity to ask questions and all were answered. The patient agreed with the plan and demonstrated an understanding of the instructions.  I provided over 30 minutes of exam, counseling, chart review and  complex critical decision making.   Marinus Maw, MD

## 2023-01-01 LAB — LIPID PANEL
Cholesterol: 140 mg/dL (ref <200)
HDL: 58 mg/dL (ref >=40)
LDL Cholesterol (Calc): 64 mg/dL
Non-HDL Cholesterol (Calc): 82 mg/dL (ref <130)
Total CHOL/HDL Ratio: 2.4 (calc) (ref <5.0)
Triglycerides: 94 mg/dL (ref <150)

## 2023-01-01 LAB — COMPLETE METABOLIC PANEL WITH GFR
AG Ratio: 1.9 (calc) (ref 1.0–2.5)
ALT: 22 U/L (ref 9–46)
AST: 25 U/L (ref 10–35)
Albumin: 4.6 g/dL (ref 3.6–5.1)
Alkaline phosphatase (APISO): 103 U/L (ref 35–144)
BUN: 21 mg/dL (ref 7–25)
CO2: 30 mmol/L (ref 20–32)
Calcium: 10 mg/dL (ref 8.6–10.3)
Chloride: 104 mmol/L (ref 98–110)
Creat: 1.01 mg/dL (ref 0.70–1.35)
Globulin: 2.4 g/dL (ref 1.9–3.7)
Glucose, Bld: 85 mg/dL (ref 65–99)
Potassium: 4.7 mmol/L (ref 3.5–5.3)
Sodium: 142 mmol/L (ref 135–146)
Total Bilirubin: 0.3 mg/dL (ref 0.2–1.2)
Total Protein: 7 g/dL (ref 6.1–8.1)
eGFR: 82 mL/min/{1.73_m2} (ref >=60)

## 2023-01-01 LAB — TESTOSTERONE: Testosterone: 612 ng/dL (ref 250–827)

## 2023-01-01 LAB — VITAMIN D 25 HYDROXY (VIT D DEFICIENCY, FRACTURES): Vit D, 25-Hydroxy: 88 ng/mL (ref 30–100)

## 2023-01-01 LAB — TSH: TSH: 0.86 m[IU]/L (ref 0.40–4.50)

## 2023-01-01 LAB — HEMOGLOBIN A1C
Hgb A1c MFr Bld: 6 %{Hb} — ABNORMAL HIGH (ref <5.7)
Mean Plasma Glucose: 126 mg/dL
eAG (mmol/L): 7 mmol/L

## 2023-01-01 LAB — MAGNESIUM: Magnesium: 2.3 mg/dL (ref 1.5–2.5)

## 2023-01-01 LAB — INSULIN, RANDOM: Insulin: 22.4 u[IU]/mL — ABNORMAL HIGH

## 2023-01-02 NOTE — Progress Notes (Signed)
<>*<>*<>*<>*<>*<>*<>*<>*<>*<>*<>*<>*<>*<>*<>*<>*<>*<>*<>*<>*<>*<>*<>*<>*<> <>*<>*<>*<>*<>*<>*<>*<>*<>*<>*<>*<>*<>*<>*<>*<>*<>*<>*<>*<>*<>*<>*<>*<>*<>  -Test results slightly outside the reference range are not unusual. If there is anything important, I will review this with you,  otherwise it is considered normal test values.  If you have further questions,  please do not hesitate to contact me at the office or via My Chart.   <>*<>*<>*<>*<>*<>*<>*<>*<>*<>*<>*<>*<>*<>*<>*<>*<>*<>*<>*<>*<>*<>*<>*<>*<> <>*<>*<>*<>*<>*<>*<>*<>*<>*<>*<>*<>*<>*<>*<>*<>*<>*<>*<>*<>*<>*<>*<>*<>*<>  -  A1c = 6.0% - still  elevated in the borderline and                                           early or pre-diabetes range which has the same   300% increased risk for heart attack, stroke, cancer and                            alzheimer- type vascular dementia as full blown diabetes.   But the good news is that diet, exercise with                                     weight loss can cure the early diabetes at this point.  <>*<>*<>*<>*<>*<>*<>*<>*<>*<>*<>*<>*<>*<>*<>*<>*<>*<>*<>*<>*<>*<>*<>*<>*<> <>*<>*<>*<>*<>*<>*<>*<>*<>*<>*<>*<>*<>*<>*<>*<>*<>*<>*<>*<>*<>*<>*<>*<>*<>  -   It is very important that you work harder with diet by                                 avoiding all foods that are white except chicken,fish & calliflower.  - Avoid white rice  (brown & wild rice is OK),   - Avoid white potatoes  (sweet potatoes in moderation is OK),   White bread or wheat bread or anything made out of   white flour like bagels, donuts, rolls, buns, biscuits, cakes,  - pastries, cookies, pizza crust, and pasta (made from  white flour & egg whites)   - vegetarian pasta or spinach or wheat pasta is OK.  - Multigrain breads like Arnold's, Pepperidge Farm or                               multigrain sandwich thins or high fiber breads like   Eureka bread or "Dave's Killer" breads that are                                                   4 to 5 grams fiber per slice !  are best.    Diet, exercise and weight loss can reverse and cure diabetes in the early stages.    - Diet, exercise and weight loss is very important in the                                   Control and prevention of complications of diabetes which  affects every system in your body, ie.   -Brain - dementia/stroke,  - eyes - glaucoma/blindness,  - heart - heart attack/heart failure,  - kidneys - dialysis,  - stomach - gastric paralysis,  - intestines - malabsorption,  - nerves - severe painful neuritis,  -  circulation - gangrene & loss of a leg(s)  - and finally  . . . . . . . . . . . . . . . . . .    - cancer and Alzheimers.  <>*<>*<>*<>*<>*<>*<>*<>*<>*<>*<>*<>*<>*<>*<>*<>*<>*<>*<>*<>*<>*<>*<>*<>*<> <>*<>*<>*<>*<>*<>*<>*<>*<>*<>*<>*<>*<>*<>*<>*<>*<>*<>*<>*<>*<>*<>*<>*<>*<>  -  Testosterone is normal   <>*<>*<>*<>*<>*<>*<>*<>*<>*<>*<>*<>*<>*<>*<>*<>*<>*<>*<>*<>*<>*<>*<>*<>*<> <>*<>*<>*<>*<>*<>*<>*<>*<>*<>*<>*<>*<>*<>*<>*<>*<>*<>*<>*<>*<>*<>*<>*<>*<>  -  Cho = 140  Excellent   - Very low risk for Heart Attack  / Stroke  ^>^>^>^>^>^>^>^>^>^>^>^>^>^>^>^>^>^>^>^>^>^>^>^>^>^>^>^>^>^>^>^>^>^>^>^>^ ^>^>^>^>^>^>^>^>^>^>^>^>^>^>^>^>^>^>^>^>^>^>^>^>^>^>^>^>^>^>^>^>^>^>^>^>^  -  TSH / Thyroid is Normal  <>*<>*<>*<>*<>*<>*<>*<>*<>*<>*<>*<>*<>*<>*<>*<>*<>*<>*<>*<>*<>*<>*<>*<>*<> <>*<>*<>*<>*<>*<>*<>*<>*<>*<>*<>*<>*<>*<>*<>*<>*<>*<>*<>*<>*<>*<>*<>*<>*<>  -  Vitamin D = 88 - Excellent -pleas keep dose same !  <>*<>*<>*<>*<>*<>*<>*<>*<>*<>*<>*<>*<>*<>*<>*<>*<>*<>*<>*<>*<>*<>*<>*<>*<> <>*<>*<>*<>*<>*<>*<>*<>*<>*<>*<>*<>*<>*<>*<>*<>*<>*<>*<>*<>*<>*<>*<>*<>*<>  -  All Else - CBC - Kidneys - Electrolytes - Liver - Magnesium & Thyroid    - all  Normal /  OK  <>*<>*<>*<>*<>*<>*<>*<>*<>*<>*<>*<>*<>*<>*<>*<>*<>*<>*<>*<>*<>*<>*<>*<>*<> <>*<>*<>*<>*<>*<>*<>*<>*<>*<>*<>*<>*<>*<>*<>*<>*<>*<>*<>*<>*<>*<>*<>*<>*<>

## 2023-01-03 ENCOUNTER — Encounter: Payer: Self-pay | Admitting: Internal Medicine

## 2023-01-18 ENCOUNTER — Encounter: Payer: Self-pay | Admitting: Cardiology

## 2023-01-18 ENCOUNTER — Ambulatory Visit: Payer: Medicare HMO | Attending: Cardiology | Admitting: Cardiology

## 2023-01-18 VITALS — BP 130/80 | HR 56 | Ht 68.0 in | Wt 192.4 lb

## 2023-01-18 DIAGNOSIS — R001 Bradycardia, unspecified: Secondary | ICD-10-CM | POA: Diagnosis not present

## 2023-01-18 DIAGNOSIS — I2584 Coronary atherosclerosis due to calcified coronary lesion: Secondary | ICD-10-CM | POA: Diagnosis not present

## 2023-01-18 DIAGNOSIS — R5381 Other malaise: Secondary | ICD-10-CM | POA: Diagnosis not present

## 2023-01-18 DIAGNOSIS — I2510 Atherosclerotic heart disease of native coronary artery without angina pectoris: Secondary | ICD-10-CM

## 2023-01-18 DIAGNOSIS — I10 Essential (primary) hypertension: Secondary | ICD-10-CM | POA: Diagnosis not present

## 2023-01-18 DIAGNOSIS — T7840XA Allergy, unspecified, initial encounter: Secondary | ICD-10-CM

## 2023-01-18 DIAGNOSIS — T7840XD Allergy, unspecified, subsequent encounter: Secondary | ICD-10-CM

## 2023-01-18 MED ORDER — AMLODIPINE BESYLATE 5 MG PO TABS: 5.0000 mg | ORAL_TABLET | Freq: Every day | ORAL | 3 refills | Status: DC

## 2023-01-18 NOTE — Patient Instructions (Signed)
Medication Instructions:  Your physician recommends that you continue on your current medications as directed. Please refer to the Current Medication list given to you today.  *If you need a refill on your cardiac medications before your next appointment, please call your pharmacy*   Lab Work: None   Testing/Procedures: None   Follow-Up: At [facility], you and your health needs are our priority.  As part of our continuing mission to provide you with exceptional heart care, we have created designated Provider Care Teams.  These Care Teams include your primary Cardiologist (physician) and Advanced Practice Providers (APPs -  Physician Assistants and Nurse Practitioners) who all work together to provide you with the care you need, when you need it.  Your next appointment:   9 month(s)  Provider:   Thomasene Ripple, DO      Other instructions: A referral has been placed for ENT, please follow up with them.

## 2023-01-18 NOTE — Progress Notes (Unsigned)
Cardiology Office Note:    Date:  01/18/2023   ID:  Gerald [name], DOB 06-04-1955, MRN [id number]  PCP:  Lucky Cowboy, MD  Cardiologist:  Thomasene Ripple, DO  Electrophysiologist:  None   Referring MD: Lucky Cowboy, MD   " I am ok"  History of Present Illness:    Gerald [name] is a 67 y.o. male with a history of hypertension, coronary calcification, hyperlipidemia, and prediabetes presents for a follow-up visit.   The patient was last seen in June 2024, at which time he was started on amlodipine 5mg  for hypertension. He reports no adverse effects from the medication, specifically denying any leg swelling. The patient is generally active, engaging in regular outdoor work and recently went hiking in [place]. However, he has been experiencing some breathing difficulties since January, which he attributes to exposure to cat dander dust while cleaning out his mother's house. Despite this, he continues to experience periodic discomfort, describing it as a pulsing drip. The patient has a history of allergies and received allergy shots in the 1960s. He also has a history of a broken leg and a possible torn cartilage.     Past Medical History:  Diagnosis Date   Acute medial meniscus tear of right knee 02/07/2021   Headache    Hyperlipidemia    Hypertension    Pre-diabetes    no meds   Vitamin D deficiency     Past Surgical History:  Procedure Laterality Date   ESOPHAGOGASTRODUODENOSCOPY     KNEE ARTHROSCOPY WITH MEDIAL MENISECTOMY Right 02/20/2021   Procedure: KNEE ARTHROSCOPY WITH MEDIAL MENISECTOMY;  Surgeon: Salvatore Marvel, MD;  Location: [facility];  Service: Orthopedics;  Laterality: Right;   KNEE SURGERY     left   ORIF ACETABULAR FRACTURE     left arm 2 times    Current Medications: Current Meds  Medication Sig   albuterol (VENTOLIN HFA) 108 (90 Base) MCG/ACT inhaler Inhale 2 puffs into the lungs every 6 (six) hours as needed for wheezing or  shortness of breath.   Ascorbic Acid (VITAMIN C) 1000 MG tablet Take 1,000 mg by mouth 2 (two) times daily.   aspirin EC 81 MG tablet Take 81 mg by mouth daily. Swallow whole.   Cholecalciferol (VITAMIN D-3) 5000 UNITS TABS Take 5,000 Units by mouth daily.    clobetasol cream (TEMOVATE) 0.05 % APPLY TO RASH TWICE DAILY AS NEEDED   cloNIDine (CATAPRES) 0.1 MG tablet TAKE 1-2 TABS BY MOUTH. MAY REPEAT EVERY 45-60 MINS AS NEEDED, MAX 4 DOSES, UNTIL SYMPTOMS RESOLVE   [employer] STOOL SOFTENER/LAXATIVE 8.6-50 MG tablet Take 1 tablet by mouth 2 (two) times daily.   fexofenadine (ALLEGRA) 180 MG tablet Take by mouth.   Flaxseed, Linseed, (FLAX SEED OIL PO) Take 1,000 mg by mouth 3 (three) times daily.    fluticasone (FLONASE) 50 MCG/ACT nasal spray Place 1 spray into both nostrils daily.   Glucosamine-Chondroitin (GLUCOSAMINE CHONDR COMPLEX PO) Take by mouth. Take 2 tablets daily   ibuprofen (ADVIL) 600 MG tablet Take 1 tablet (600 mg total) by mouth every 6 (six) hours as needed.   ipratropium (ATROVENT) 0.06 % nasal spray Use 1 to 2 sprays each nostril 2 to 3 x /day as needed   Magnesium 500 MG TABS Take 1 tablet (500 mg total) by mouth daily.   Melatonin 10 MG CAPS Take by mouth Nightly.   montelukast (SINGULAIR) 10 MG tablet TAKE 1 TABLET BY MOUTH EVERY DAY   Omega-3 Fatty  Acids (FISH OIL PO) Take 1,000 mg by mouth 3 (three) times daily.    pregabalin (LYRICA) 200 MG capsule Take  1 capsule  2 to 3 x /day  for Pain   Probiotic Product (ALIGN) 4 MG CAPS Take 1 capsule daily for Probiotic Benefit   rosuvastatin (CRESTOR) 40 MG tablet TAKE 1 TABLET BY MOUTH EVERY DAY FOR CHOLESTEROL   Testosterone 10 MG/ACT (2%) GEL APPLY THE CONTENTS OF 4    PUMP ACTUATIONS DAILY TO   THIGHS   traZODone (DESYREL) 150 MG tablet Take  1/2 to 1 tablet   1 to 2 hours  before Bedtime  as needed for Sleep   vitamin B-12 (CYANOCOBALAMIN) 100 MCG tablet Take 100 mcg by mouth daily.   WIXELA INHUB 500-50 MCG/ACT AEPB INHALE 1  PUFF INTO THE LUNGS IN THE MORNING AND AT BEDTIME.   zolmitriptan (ZOMIG) 5 MG tablet TAKE 1 TABLET BY MOUTH AT ONSET OF HEADACHE; MAY REPEAT 1 TABLET IN 2 HOURS IF NEEDED.     Allergies:   Patient has no known allergies.   Social History   Socioeconomic History   Marital status: Married    Spouse name: Not on file   Number of children: Not on file   Years of education: Not on file   Highest education level: Not on file  Occupational History   Not on file  Tobacco Use   Smoking status: Never   Smokeless tobacco: Never  Vaping Use   Vaping status: Never Used  Substance and Sexual Activity   Alcohol use: Yes    Alcohol/week: 2.0 - 5.0 standard drinks of alcohol    Types: 2 - 5 Standard drinks or equivalent per week   Drug use: No   Sexual activity: Not on file  Other Topics Concern   Not on file  Social History Narrative   Not on file   Social Determinants of Health   Financial Resource Strain: Low Risk  (03/20/2022)   Received from [facility], [facility]   Overall Financial Resource Strain (CARDIA)    Difficulty of Paying Living Expenses: Not hard at all  Food Insecurity: No Food Insecurity (03/20/2022)   Received from [facility], [facility]   Hunger Vital Sign    Worried About Running Out of Food in the Last Year: Never true    Ran Out of Food in the Last Year: Never true  Transportation Needs: No Transportation Needs (03/20/2022)   Received from [facility], [facility]   [facility] - Transportation    In the past 12 months, has lack of transportation kept you from medical appointments or from getting medications?: No    Lack of Transportation (Non-Medical): No  Physical Activity: Not on file  Stress: Not on file  Social Connections: Not on file     Family History: The patient's family history includes Diabetes in his mother; Hyperlipidemia in his mother;  Hypertension in his father and mother.  ROS:   Review of Systems  Constitution: Negative for decreased appetite, fever and weight gain.  HENT: Negative for congestion, ear discharge, hoarse voice and sore throat.   Eyes: Negative for discharge, redness, vision loss in right eye and visual halos.  Cardiovascular: Negative for chest pain, dyspnea on exertion, leg swelling, orthopnea and palpitations.  Respiratory: Negative for cough, hemoptysis, shortness of breath and snoring.   Endocrine: Negative for heat intolerance and polyphagia.  Hematologic/Lymphatic: Negative  for bleeding problem. Does not bruise/bleed easily.  Skin: Negative for flushing, nail changes, rash and suspicious lesions.  Musculoskeletal: Negative for arthritis, joint pain, muscle cramps, myalgias, neck pain and stiffness.  Gastrointestinal: Negative for abdominal pain, bowel incontinence, diarrhea and excessive appetite.  Genitourinary: Negative for decreased libido, genital sores and incomplete emptying.  Neurological: Negative for brief paralysis, focal weakness, headaches and loss of balance.  Psychiatric/Behavioral: Negative for altered mental status, depression and suicidal ideas.  Allergic/Immunologic: Negative for HIV exposure and persistent infections.    EKGs/Labs/Other Studies Reviewed:    The following studies were reviewed today:   EKG:  The ekg ordered today demonstrates Sinus bradycardia, HR 56 bpm  Recent Labs: 12/31/2022: ALT 22; BUN 21; Creat 1.01; Hemoglobin 13.3; Magnesium 2.3; Platelets 323; Potassium 4.7; Sodium 142; TSH 0.86  Recent Lipid Panel    Component Value Date/Time   CHOL 140 12/31/2022 1121   CHOL 136 06/16/2022 0946   TRIG 94 12/31/2022 1121   HDL 58 12/31/2022 1121   HDL 51 06/16/2022 0946   CHOLHDL 2.4 12/31/2022 1121   VLDL 29 10/15/2016 1123   LDLCALC 64 12/31/2022 1121    Physical Exam:    VS:  BP 130/80 (BP Location: Right Arm, Patient Position: Sitting, Cuff Size:  Normal)   Pulse (!) 56   Ht 5\' 8"  (1.727 m)   Wt 192 lb 6.4 oz (87.3 kg)   SpO2 96%   BMI 29.25 kg/m     Wt Readings from Last 3 Encounters:  01/18/23 192 lb 6.4 oz (87.3 kg)  12/31/22 190 lb 3.2 oz (86.3 kg)  10/01/22 195 lb (88.5 kg)     GEN: Well nourished, well developed in no acute distress HEENT: Normal NECK: No JVD; No carotid bruits LYMPHATICS: No lymphadenopathy CARDIAC: S1S2 noted,RRR, no murmurs, rubs, gallops RESPIRATORY:  Clear to auscultation without rales, wheezing or rhonchi  ABDOMEN: Soft, non-tender, non-distended, +bowel sounds, no guarding. EXTREMITIES: No edema, No cyanosis, no clubbing MUSCULOSKELETAL:  No deformity  SKIN: Warm and dry NEUROLOGIC:  Alert and oriented x 3, non-focal PSYCHIATRIC:  Normal affect, good insight  ASSESSMENT:    1. Sinus bradycardia   2. Allergy, initial encounter   3. Chronic hypertension   4. Physical deconditioning   5. Coronary artery calcification of native artery    PLAN:    Well controlled on Amlodipine 5mg  daily. No reported side effects. Continue Amlodipine 5mg  daily.  History of allergies, recent exposure to cat dander dust causing respiratory symptoms. Symptoms have improved but still experiencing postnasal drip. Refer to ENT for further evaluation and management.  Hyperlipidemia - On Crestor. Continue Crestor.  Prediabetes - No new issues discussed. Continue current management.  Follow-up in 9 months   The patient is in agreement with the above plan. The patient left the office in stable condition.  The patient will follow up in  Medication Adjustments/Labs and Tests Ordered: Current medicines are reviewed at length with the patient today.  Concerns regarding medicines are outlined above.  Orders Placed This Encounter  Procedures   Ambulatory referral to ENT   EKG 12-Lead   Meds ordered this encounter  Medications   amLODipine (NORVASC) 5 MG tablet    Sig: Take 1 tablet (5 mg total) by mouth  daily.    Dispense:  90 tablet    Refill:  3    Patient Instructions  Medication Instructions:  Your physician recommends that you continue on your current medications as directed. Please refer to the  Current Medication list given to you today.  *If you need a refill on your cardiac medications before your next appointment, please call your pharmacy*   Lab Work: None   Testing/Procedures: None   Follow-Up: At [facility], you and your health needs are our priority.  As part of our continuing mission to provide you with exceptional heart care, we have created designated Provider Care Teams.  These Care Teams include your primary Cardiologist (physician) and Advanced Practice Providers (APPs -  Physician Assistants and Nurse Practitioners) who all work together to provide you with the care you need, when you need it.  Your next appointment:   9 month(s)  Provider:   Thomasene Ripple, DO      Other instructions: A referral has been placed for ENT, please follow up with them.    Adopting a Healthy Lifestyle.  Know what a healthy weight is for you (roughly BMI <25) and aim to maintain this   Aim for 7+ servings of fruits and vegetables daily   65-80+ fluid ounces of water or unsweet tea for healthy kidneys   Limit to max 1 drink of alcohol per day; avoid smoking/tobacco   Limit animal fats in diet for cholesterol and heart health - choose grass fed whenever available   Avoid highly processed foods, and foods high in saturated/trans fats   Aim for low stress - take time to unwind and care for your mental health   Aim for 150 min of moderate intensity exercise weekly for heart health, and weights twice weekly for bone health   Aim for 7-9 hours of sleep daily   When it comes to diets, agreement about the perfect plan isnt easy to find, even among the experts. Experts at the [facility] [employer] developed an idea known as the Healthy Eating Plate. Just  imagine a plate divided into logical, healthy portions.   The emphasis is on diet quality:   Load up on vegetables and fruits - one-half of your plate: Aim for color and variety, and remember that potatoes dont count.   Go for whole grains - one-quarter of your plate: Whole wheat, barley, wheat berries, quinoa, oats, brown rice, and foods made with them. If you want pasta, go with whole wheat pasta.   Protein power - one-quarter of your plate: Fish, chicken, beans, and nuts are all healthy, versatile protein sources. Limit red meat.   The diet, however, does go beyond the plate, offering a few other suggestions.   Use healthy plant oils, such as olive, canola, soy, corn, sunflower and peanut. Check the labels, and avoid partially hydrogenated oil, which have unhealthy trans fats.   If youre thirsty, drink water. Coffee and tea are good in moderation, but skip sugary drinks and limit milk and dairy products to one or two daily servings.   The type of carbohydrate in the diet is more important than the amount. Some sources of carbohydrates, such as vegetables, fruits, whole grains, and beans-are healthier than others.   Finally, stay active  Signed, Thomasene Ripple, DO  01/18/2023 4:13 PM    [facility]

## 2023-01-25 DIAGNOSIS — N5203 Combined arterial insufficiency and corporo-venous occlusive erectile dysfunction: Secondary | ICD-10-CM | POA: Diagnosis not present

## 2023-01-25 DIAGNOSIS — N401 Enlarged prostate with lower urinary tract symptoms: Secondary | ICD-10-CM | POA: Diagnosis not present

## 2023-01-25 DIAGNOSIS — N138 Other obstructive and reflux uropathy: Secondary | ICD-10-CM | POA: Diagnosis not present

## 2023-02-05 ENCOUNTER — Encounter: Payer: Self-pay | Admitting: Internal Medicine

## 2023-02-11 DIAGNOSIS — H31002 Unspecified chorioretinal scars, left eye: Secondary | ICD-10-CM | POA: Diagnosis not present

## 2023-02-11 DIAGNOSIS — H5213 Myopia, bilateral: Secondary | ICD-10-CM | POA: Diagnosis not present

## 2023-02-24 ENCOUNTER — Encounter (INDEPENDENT_AMBULATORY_CARE_PROVIDER_SITE_OTHER): Payer: Self-pay

## 2023-02-24 ENCOUNTER — Ambulatory Visit (INDEPENDENT_AMBULATORY_CARE_PROVIDER_SITE_OTHER): Payer: Medicare HMO | Admitting: Otolaryngology

## 2023-02-24 ENCOUNTER — Other Ambulatory Visit: Payer: Self-pay | Admitting: Cardiology

## 2023-02-24 VITALS — Ht 68.0 in | Wt 185.0 lb

## 2023-02-24 DIAGNOSIS — E782 Mixed hyperlipidemia: Secondary | ICD-10-CM

## 2023-02-24 DIAGNOSIS — J3089 Other allergic rhinitis: Secondary | ICD-10-CM

## 2023-02-24 DIAGNOSIS — R0981 Nasal congestion: Secondary | ICD-10-CM

## 2023-02-24 DIAGNOSIS — J342 Deviated nasal septum: Secondary | ICD-10-CM | POA: Diagnosis not present

## 2023-02-24 DIAGNOSIS — R0982 Postnasal drip: Secondary | ICD-10-CM | POA: Diagnosis not present

## 2023-02-24 MED ORDER — AZELASTINE HCL 0.1 % NA SOLN: 2.0000 | Freq: Two times a day (BID) | NASAL | 12 refills | Status: DC | PRN

## 2023-02-24 NOTE — Patient Instructions (Addendum)
Use flonase twice per day Use astepro twice per day - as needed, especially if lot of dust exposure Go to any [facility] lab for the allergy blood test Use saline rinse daily Use ipratropium spray as needed Lloyd Huger Med Nasal Saline Rinse   - start nasal saline rinses with NeilMed Bottle available over the counter    Nasal Saline Irrigation instructions: If you choose to make your own salt water solution, You will need: Salt (kosher, canning, or pickling salt) Baking soda Nasal irrigation bottle (i.e. Lloyd Huger Med Sinus Rinse) Measuring spoon ( teaspoon) Distilled / boiled water   Mix solution Mix 1 teaspoon of salt, 1/2 teaspoon of baking soda and 1 cup of water into irrigation bottle ** May use saline packet instead of homemade recipe for this step if you prefer If medicine was prescribed to be mixed with solution, place this into bottle Examples 2 inches of 2% mupirocin ointment Budesonide solution Position your head: Lean over sink (about 45 degrees) Rotate head (about 45 degrees) so that one nostril is above the other Irrigate Insert tip of irrigation bottle into upper nostril so it forms a comfortable seal Irrigate while breathing through your mouth May remove the straw from the bottle in order to irrigate the entire solution (important if medicine was added) Exhale through nose when finished and blow nose as necessary  Repeat on opposite side with other 1/2 of solution (120 mL) or remake solution if all 240 mL was used on first side Wash irrigation bottle regularly, replace every 3 months

## 2023-02-24 NOTE — Progress Notes (Signed)
Dear Dr. Servando Salina, Here [name] my assessment for our mutual patient, Gerald [name]. Thank you for allowing me the opportunity to care for your patient. Please do not hesitate to contact me should you have any other questions. Sincerely, Dr. Jovita Kussmaul  Otolaryngology Clinic Note Referring provider: Dr. Servando Salina HPI:  Gerald [name] a 67 y.o. male kindly referred by Dr. Servando Salina for evaluation of post nasal drip and nasal complaints.  Initial visit (02/2023): He reports that he cleaned out his mother's house in January 2024, and then started to have some post nasal drip, nasal congestion, throat clearing and irritation. He reports that he had it until about mid-May when it improved. He now reports that if he [name] in a dusty environment, he has some PND and throat clearing and some dysphonia. No itchy eyes or nose from this.  No facial pressure/pain, maybe some hyposmia, no discolored drainage, or frequent sinus infections (maybe once a year).  He has seen Dr. Judeth Horn (Pulm), and his primary care doctor, who prescribed Prednisone and Antibiotics (April 2024). He has tried Careers adviser, Ipratropium (uses daily), and Flonase. Sprays help a fair amount. He also uses saline spray. He has not tried nasal saline rinses. No recent allergy testing.  He reports he has had history of Asthma and Allergies (unable to review records) and did get allergy testing early in his life and got allergy shots at [facility] This did help him significantly.  No reflux symptoms.  He uses Wixela perhaps 2x/week when he [name] having an exacerbation. Has not had to use albuterol  PMHx: HLD, HTN, Pre-diabetes, Migraines, CAD  H&N Surgery: sinus surgery 1989 Personal or FHx of bleeding dz or anesthesia difficulty: no   AP/AC: ASA 81  Tobacco: no. Lives in [place]  Independent Review of Additional Tests or Records:  [facility] 04/2021: unable to review images  Referral notes, pulm notes and labs reviewed   CXR 2024: no  opacification/consolidation Eos 2024: 80 PMH/Meds/All/SocHx/FamHx/ROS:   Past Medical History:  Diagnosis Date   Acute medial meniscus tear of right knee 02/07/2021   Headache    Hyperlipidemia    Hypertension    Pre-diabetes    no meds   Vitamin D deficiency      Past Surgical History:  Procedure Laterality Date   ESOPHAGOGASTRODUODENOSCOPY     KNEE ARTHROSCOPY WITH MEDIAL MENISECTOMY Right 02/20/2021   Procedure: KNEE ARTHROSCOPY WITH MEDIAL MENISECTOMY;  Surgeon: Salvatore Marvel, MD;  Location: [facility];  Service: Orthopedics;  Laterality: Right;   KNEE SURGERY     left   ORIF ACETABULAR FRACTURE     left arm 2 times    Family History  Problem Relation Age of Onset   Hypertension Mother    Hyperlipidemia Mother    Diabetes Mother    Hypertension Father      Social Connections: Not on file      Current Outpatient Medications:    albuterol (VENTOLIN HFA) 108 (90 Base) MCG/ACT inhaler, Inhale 2 puffs into the lungs every 6 (six) hours as needed for wheezing or shortness of breath., Disp: 8 g, Rfl: 2   amLODipine (NORVASC) 5 MG tablet, Take 1 tablet (5 mg total) by mouth daily., Disp: 90 tablet, Rfl: 3   Ascorbic Acid (VITAMIN C) 1000 MG tablet, Take 1,000 mg by mouth 2 (two) times daily., Disp: , Rfl:    aspirin EC 81 MG tablet, Take 81 mg by mouth daily. Swallow whole., Disp: , Rfl:    azelastine (ASTELIN)  0.1 % nasal spray, Place 2 sprays into both nostrils 2 (two) times daily as needed for rhinitis. Use in each nostril as directed, Disp: 30 mL, Rfl: 12   Cholecalciferol (VITAMIN D-3) 5000 UNITS TABS, Take 5,000 Units by mouth daily. , Disp: , Rfl:    clobetasol cream (TEMOVATE) 0.05 %, APPLY TO RASH TWICE DAILY AS NEEDED, Disp: 60 g, Rfl: 3   cloNIDine (CATAPRES) 0.1 MG tablet, TAKE 1-2 TABS BY MOUTH. MAY REPEAT EVERY 45-60 MINS AS NEEDED, MAX 4 DOSES, UNTIL SYMPTOMS RESOLVE, Disp: 90 tablet, Rfl: 0   CVS STOOL SOFTENER/LAXATIVE 8.6-50 MG tablet, Take  1 tablet by mouth 2 (two) times daily., Disp: , Rfl:    fexofenadine (ALLEGRA) 180 MG tablet, Take by mouth., Disp: , Rfl:    finasteride (PROSCAR) 5 MG tablet, Take 5 mg by mouth daily., Disp: , Rfl:    Flaxseed, Linseed, (FLAX SEED OIL PO), Take 1,000 mg by mouth 3 (three) times daily. , Disp: , Rfl:    fluticasone (FLONASE) 50 MCG/ACT nasal spray, Place 1 spray into both nostrils daily., Disp: 11.1 mL, Rfl: 2   Fluticasone-Salmeterol (WIXELA INHUB IN), Inhale into the lungs., Disp: , Rfl:    Glucosamine-Chondroitin (GLUCOSAMINE CHONDR COMPLEX PO), Take by mouth. Take 2 tablets daily, Disp: , Rfl:    guaiFENesin (MUCINEX PO), Take by mouth., Disp: , Rfl:    ibuprofen (ADVIL) 600 MG tablet, Take 1 tablet (600 mg total) by mouth every 6 (six) hours as needed., Disp: 30 tablet, Rfl: 0   ipratropium (ATROVENT) 0.06 % nasal spray, Use 1 to 2 sprays each nostril 2 to 3 x /day as needed, Disp: 45 mL, Rfl: 3   Magnesium 500 MG TABS, Take 1 tablet (500 mg total) by mouth daily., Disp: 30 tablet, Rfl: 0   Melatonin 10 MG CAPS, Take by mouth Nightly., Disp: , Rfl:    montelukast (SINGULAIR) 10 MG tablet, TAKE 1 TABLET BY MOUTH EVERY DAY, Disp: 90 tablet, Rfl: 3   Omega-3 Fatty Acids (FISH OIL PO), Take 1,000 mg by mouth 3 (three) times daily. , Disp: , Rfl:    pregabalin (LYRICA) 200 MG capsule, Take  1 capsule  2 to 3 x /day  for Pain, Disp: 270 capsule, Rfl: 1   Probiotic Product (ALIGN) 4 MG CAPS, Take 1 capsule daily for Probiotic Benefit, Disp: 90 capsule, Rfl: 1   rosuvastatin (CRESTOR) 40 MG tablet, TAKE 1 TABLET BY MOUTH EVERY DAY FOR CHOLESTEROL, Disp: 90 tablet, Rfl: 1   tamsulosin (FLOMAX) 0.4 MG CAPS capsule, Take 0.4 mg by mouth., Disp: , Rfl:    Testosterone 10 MG/ACT (2%) GEL, APPLY THE CONTENTS OF 4    PUMP ACTUATIONS DAILY TO   THIGHS, Disp: 360 g, Rfl: 1   traZODone (DESYREL) 150 MG tablet, Take  1/2 to 1 tablet   1 to 2 hours  before Bedtime  as needed for Sleep, Disp: 90 tablet, Rfl:  3   vitamin B-12 (CYANOCOBALAMIN) 100 MCG tablet, Take 100 mcg by mouth daily., Disp: , Rfl:    WIXELA INHUB 500-50 MCG/ACT AEPB, INHALE 1 PUFF INTO THE LUNGS IN THE MORNING AND AT BEDTIME., Disp: 60 each, Rfl: 3   zolmitriptan (ZOMIG) 5 MG tablet, TAKE 1 TABLET BY MOUTH AT ONSET OF HEADACHE; MAY REPEAT 1 TABLET IN 2 HOURS IF NEEDED., Disp: 24 tablet, Rfl: 2   ALPRAZolam (XANAX) 0.5 MG tablet, TAKE 1/2-1 TAB DAILY IF NEEDED FOR ANXIETY ATTACK (MAX 5 DAYS/WEEK TO AVOID  ADDICTION/DEMENTIA) (Patient not taking: Reported on 02/24/2023), Disp: 30 tablet, Rfl: 0   sildenafil (VIAGRA) 100 MG tablet, Take 100 mg by mouth as needed., Disp: , Rfl:    Physical Exam:   Ht 5\' 8"  (1.727 m)   Wt 185 lb (83.9 kg)   BMI 28.13 kg/m   Salient findings:  CN II-XII intact  Bilateral EAC clear and TM intact with well pneumatized middle ear spaces Anterior rhinoscopy: Septum relatively midline; bilateral inferior turbinates without significant hypertrophy; nasal endoscopy was indicated for further evaluation of nasal cavity and paranasal sinuses given nature of complaints No lesions of oral cavity/oropharynx No obviously palpable neck masses/lymphadenopathy/thyromegaly No respiratory distress or stridor  Seprately Identifiable Procedures:  PROCEDURE: Bilateral Diagnostic Rigid Nasal Endoscopy Pre-procedure diagnosis: Concern for sinusitis; post nasal drip, nasal congestion Post-procedure diagnosis: same Indication: See pre-procedure diagnosis and physical exam above Complications: None apparent EBL: 0 mL Anesthesia: Lidocaine 4% and topical decongestant was topically sprayed in each nasal cavity  Description of Procedure:  Patient was identified. A rigid 30 degree endoscope was utilized to evaluate the sinonasal cavities, mucosa, sinus ostia and turbinates and septum.  Overall, signs of mucosal inflammation are not noted.  No mucopurulence, polyps, or masses noted.   Right Middle meatus: clear Right SE  Recess: clear Left MM: clear Left SE Recess: clear  CPT CODE -- 96045 - Mod 25   Impression & Plans  Gerald [name] [name] a 67 y.o. male with: 1. Non-seasonal allergic rhinitis due to other allergic trigger   2. Nasal congestion   3. Nasal septal deviation    Symptoms appear to be most consistent with a allergic cause/exacerbation. He [name] not having chronic sinusitis symptoms, and [name] improving on nasal sprays. He has also derived prior benefit from immunotherapy/allergy eval. As such, we discussed his options: Observation 2. Continued medical management 3. Further imaging (post-treatment CT) Given no CRS sx, will hold off on #3 Will treat him with - Start Saline Rinses Daily - Continue Flonase 2 puffs BID - Continue ipratropium BID - start Astelin BID (see below) - Will get RAST + IgE  - f/u 3 months  See below regarding exact medications prescribed this encounter including dosages and route: Meds ordered this encounter  Medications   azelastine (ASTELIN) 0.1 % nasal spray    Sig: Place 2 sprays into both nostrils 2 (two) times daily as needed for rhinitis. Use in each nostril as directed    Dispense:  30 mL    Refill:  12      Thank you for allowing me the opportunity to care for your patient. Please do not hesitate to contact me should you have any other questions.  Sincerely, Jovita Kussmaul, MD Otolarynoglogist (ENT), [facility] Phone: [phone] Fax: [phone]  02/28/2023, 10:14 AM   MDM:  Level 4 Complexity/Problems addressed: low Data complexity: mod - independent review of notes, labs, and imaging - Morbidity: mod  - Prescription Drug prescribed or managed: yes

## 2023-03-15 DIAGNOSIS — R0981 Nasal congestion: Secondary | ICD-10-CM | POA: Diagnosis not present

## 2023-03-15 DIAGNOSIS — J3089 Other allergic rhinitis: Secondary | ICD-10-CM | POA: Diagnosis not present

## 2023-03-15 DIAGNOSIS — J342 Deviated nasal septum: Secondary | ICD-10-CM | POA: Diagnosis not present

## 2023-03-17 LAB — ALLERGEN PANEL (27) + IGE
Alternaria Alternata IgE: <0.1 kU/L
Aspergillus Fumigatus IgE: <0.1 kU/L
Bahia Grass IgE: <0.1 kU/L
Bermuda Grass IgE: <0.1 kU/L
Cat Dander IgE: <0.1 kU/L
Cedar, Mountain IgE: <0.1 kU/L
Cladosporium Herbarum IgE: <0.1 kU/L
Cocklebur IgE: <0.1 kU/L
Cockroach, American IgE: <0.1 kU/L
Common Silver Birch IgE: <0.1 kU/L
D Farinae IgE: <0.1 kU/L
D Pteronyssinus IgE: <0.1 kU/L
Dog Dander IgE: <0.1 kU/L
Elm, American IgE: <0.1 kU/L
Hickory, White IgE: <0.1 kU/L
IgE (Immunoglobulin E), Serum: 5 [IU]/mL — ABNORMAL LOW (ref 6–495)
Johnson Grass IgE: <0.1 kU/L
Kentucky Bluegrass IgE: <0.1 kU/L
Maple/Box Elder IgE: <0.1 kU/L
Mucor Racemosus IgE: <0.1 kU/L
Oak, White IgE: <0.1 kU/L
Penicillium Chrysogen IgE: <0.1 kU/L
Pigweed, Rough IgE: <0.1 kU/L
Plantain, English IgE: <0.1 kU/L
Ragweed, Short IgE: <0.1 kU/L
Setomelanomma Rostrat: <0.1 kU/L
Timothy Grass IgE: <0.1 kU/L
White Mulberry IgE: <0.1 kU/L

## 2023-03-26 ENCOUNTER — Ambulatory Visit: Payer: Medicare HMO | Admitting: Primary Care

## 2023-03-26 VITALS — BP 124/70 | HR 56 | Ht 68.0 in | Wt 190.0 lb

## 2023-03-26 DIAGNOSIS — J4540 Moderate persistent asthma, uncomplicated: Secondary | ICD-10-CM | POA: Diagnosis not present

## 2023-03-26 NOTE — Patient Instructions (Addendum)
-  ASTHMA: Asthma is a condition where your airways narrow and swell, producing extra mucus, which can make breathing difficult. You should continue using Wixela as needed, but if you find yourself using it 3-4 times per week, switch back to daily dosing. Continue Zyrtec and Singulair daily. Keep Albuterol on hand for sudden flare-ups. You will be referred to an allergist for skin testing and potential allergy shots or biologic therapy.  -CHRONIC SINUS CONGESTION: Chronic sinus congestion is a condition where your nasal passages are consistently swollen and inflamed, leading to a stuffy nose. Continue with your current management plan and discuss further treatment options with an allergist.  Follow-up 6 months with Dr. Judeth Horn

## 2023-03-26 NOTE — Progress Notes (Signed)
@Patient  ID: Gerald [name], male    DOB: [birth year], 67 y.o.   MRN: [id number]  Chief Complaint  Patient presents with   Follow-up    Pt has follow up with ENT  and give nasal sprays and tested for allergy and result were neg. Pt stated that he feeling better, still having post nasal drip.     Referring provider: Lucky Cowboy, MD  HPI: 67 year old male, never smoked. PMH significant moderate persistent asthma, chronic hypertension, CAD, migraine headaches, sinus bradycardia,   Previous [facility] pulmonary encounter: 06/15/22 Ongoing daily cough, intermittent tightness of chest/upper airway: Started after exposure to cat dander, other dust while cleaning to see his mother's property.  Gradually improving over time other still persistent cough and intermittent symptoms.  Exposure over time is decreasing as well.  Albuterol helps some.  High-dose Advair discus twice daily to see if improves symptoms given high suspicion for reactivation of prior asthma.   Nasal congestion: Continue current regimen via PCP, Flonase, antihistamines etc.   Pulmonary nodules: Multiple less than 6 mm, no follow-up per Fleischner criteria as patient is low risk.    03/26/2023 Discussed the use of AI scribe software for clinical note transcription with the patient, who gave verbal consent to proceed.  The patient, with a history of childhood asthma and allergies, presented with a recurrence of respiratory symptoms following exposure to cat dander, dust, and urine while cleaning a relative's house in January 2024. The patient reported experiencing chest tightness, wheezing, and coughing, which persisted until May 2024. The patient had not experienced such symptoms for approximately 50 years prior to this incident.  The patient has a history of allergy shots in childhood and early adulthood due to a wide range of allergies, including dust, cats, dogs, and shrimp. He also received allergy shots for mold allergies  approximately 20 years ago. The patient reported that his asthma symptoms largely resolved in his teens and remained controlled until the recent exposure incident.  The patient was prescribed Wixela (generic Advair), which he initially took daily but has since reduced to as-needed usage, approximately once or twice a week. The patient reported that his symptoms have improved significantly, but he still experiences mild wheezing in certain situations. Specifically, the patient noted that he becomes wheezy after exposure to pine needles, woodworking dust, and when sorting through old items in his basement. He take zyrtec and Singulair daily.   The patient also reported daily sinus congestion, which he has experienced to some degree his entire life, but noted that this has worsened since the exposure incident in January 2024.       No Known Allergies  Immunization History  Administered Date(s) Administered   Influenza Inj Mdck Quad Pf 01/04/2019   Influenza Inj Mdck Quad With Preservative 12/27/2017   Influenza Split 01/08/2014   Influenza Whole 01/05/2013   Influenza, High Dose Seasonal PF 02/13/2022, 12/31/2022   Influenza,inj,Quad PF,6+ Mos 01/15/2021   Influenza-Unspecified 01/05/2015   Moderna SARS-COV2 Booster Vaccination 02/13/2022   PFIZER(Purple Top)SARS-COV-2 Vaccination 06/05/2019, 06/27/2019, 03/09/2020   PNEUMOCOCCAL CONJUGATE-20 08/22/2021   PPD Test 01/08/2014, 01/14/2015, 12/12/2015, 01/12/2017, 02/08/2018, 04/27/2019, 05/08/2020   Pfizer Covid-19 Vaccine Bivalent Booster 64yrs & up 03/06/2021   Respiratory Syncytial Virus Vaccine,Recomb Aduvanted(Arexvy) 02/13/2022   Td 08/22/2021   Tdap 06/15/2008   Unspecified SARS-COV-2 Vaccination 02/16/2023    Past Medical History:  Diagnosis Date   Acute medial meniscus tear of right knee 02/07/2021   Headache    Hyperlipidemia  Hypertension    Pre-diabetes    no meds   Vitamin D deficiency     Tobacco History: Social  History   Tobacco Use  Smoking Status Never  Smokeless Tobacco Never   Counseling given: Not Answered   Outpatient Medications Prior to Visit  Medication Sig Dispense Refill   albuterol (VENTOLIN HFA) 108 (90 Base) MCG/ACT inhaler Inhale 2 puffs into the lungs every 6 (six) hours as needed for wheezing or shortness of breath. 8 g 2   ALPRAZolam (XANAX) 0.5 MG tablet TAKE 1/2-1 TAB DAILY IF NEEDED FOR ANXIETY ATTACK (MAX 5 DAYS/WEEK TO AVOID ADDICTION/DEMENTIA) 30 tablet 0   amLODipine (NORVASC) 5 MG tablet Take 1 tablet (5 mg total) by mouth daily. 90 tablet 3   Ascorbic Acid (VITAMIN C) 1000 MG tablet Take 1,000 mg by mouth 2 (two) times daily.     aspirin EC 81 MG tablet Take 81 mg by mouth daily. Swallow whole.     azelastine (ASTELIN) 0.1 % nasal spray Place 2 sprays into both nostrils 2 (two) times daily as needed for rhinitis. Use in each nostril as directed 30 mL 12   Cholecalciferol (VITAMIN D-3) 5000 UNITS TABS Take 5,000 Units by mouth daily.      clobetasol cream (TEMOVATE) 0.05 % APPLY TO RASH TWICE DAILY AS NEEDED 60 g 3   cloNIDine (CATAPRES) 0.1 MG tablet TAKE 1-2 TABS BY MOUTH. MAY REPEAT EVERY 45-60 MINS AS NEEDED, MAX 4 DOSES, UNTIL SYMPTOMS RESOLVE 90 tablet 0   [employer] STOOL SOFTENER/LAXATIVE 8.6-50 MG tablet Take 1 tablet by mouth 2 (two) times daily.     fexofenadine (ALLEGRA) 180 MG tablet Take by mouth.     finasteride (PROSCAR) 5 MG tablet Take 5 mg by mouth daily.     Flaxseed, Linseed, (FLAX SEED OIL PO) Take 1,000 mg by mouth 3 (three) times daily.      fluticasone (FLONASE) 50 MCG/ACT nasal spray Place 1 spray into both nostrils daily. 11.1 mL 2   Fluticasone-Salmeterol (WIXELA INHUB IN) Inhale into the lungs.     Glucosamine-Chondroitin (GLUCOSAMINE CHONDR COMPLEX PO) Take by mouth. Take 2 tablets daily     guaiFENesin (MUCINEX PO) Take by mouth.     ibuprofen (ADVIL) 600 MG tablet Take 1 tablet (600 mg total) by mouth every 6 (six) hours as needed. 30 tablet 0    ipratropium (ATROVENT) 0.06 % nasal spray Use 1 to 2 sprays each nostril 2 to 3 x /day as needed 45 mL 3   Magnesium 500 MG TABS Take 1 tablet (500 mg total) by mouth daily. 30 tablet 0   Melatonin 10 MG CAPS Take by mouth Nightly.     montelukast (SINGULAIR) 10 MG tablet TAKE 1 TABLET BY MOUTH EVERY DAY 90 tablet 3   Omega-3 Fatty Acids (FISH OIL PO) Take 1,000 mg by mouth 3 (three) times daily.      pregabalin (LYRICA) 200 MG capsule Take  1 capsule  2 to 3 x /day  for Pain 270 capsule 1   Probiotic Product (ALIGN) 4 MG CAPS Take 1 capsule daily for Probiotic Benefit 90 capsule 1   rosuvastatin (CRESTOR) 40 MG tablet TAKE 1 TABLET BY MOUTH EVERY DAY FOR CHOLESTEROL 90 tablet 1   sildenafil (VIAGRA) 100 MG tablet Take 100 mg by mouth as needed.     tamsulosin (FLOMAX) 0.4 MG CAPS capsule Take 0.4 mg by mouth.     Testosterone 10 MG/ACT (2%) GEL APPLY THE CONTENTS OF  4    PUMP ACTUATIONS DAILY TO   THIGHS 360 g 1   traZODone (DESYREL) 150 MG tablet Take  1/2 to 1 tablet   1 to 2 hours  before Bedtime  as needed for Sleep 90 tablet 3   vitamin B-12 (CYANOCOBALAMIN) 100 MCG tablet Take 100 mcg by mouth daily.     WIXELA INHUB 500-50 MCG/ACT AEPB INHALE 1 PUFF INTO THE LUNGS IN THE MORNING AND AT BEDTIME. 60 each 3   zolmitriptan (ZOMIG) 5 MG tablet TAKE 1 TABLET BY MOUTH AT ONSET OF HEADACHE; MAY REPEAT 1 TABLET IN 2 HOURS IF NEEDED. 24 tablet 2   No facility-administered medications prior to visit.   Review of Systems  Review of Systems  Constitutional: Negative.   HENT:  Positive for postnasal drip.   Respiratory: Negative.         Intermittent chest tightness/wheezing    Physical Exam  BP 124/70   Pulse (!) 56   Ht 5\' 8"  (1.727 m)   Wt 190 lb (86.2 kg)   SpO2 98% Comment: room air  BMI 28.89 kg/m  Physical Exam Constitutional:      General: He is not in acute distress.    Appearance: Normal appearance. He is not ill-appearing.  HENT:     Head: Normocephalic and atraumatic.   Cardiovascular:     Rate and Rhythm: Normal rate and regular rhythm.  Pulmonary:     Effort: Pulmonary effort is normal.     Breath sounds: Normal breath sounds. No wheezing or rhonchi.     Comments: CTA Skin:    General: Skin is warm.  Neurological:     General: No focal deficit present.     Mental Status: He is alert and oriented to person, place, and time. Mental status is at baseline.  Psychiatric:        Mood and Affect: Mood normal.        Behavior: Behavior normal.        Thought Content: Thought content normal.        Judgment: Judgment normal.      Lab Results:  CBC    Component Value Date/Time   WBC 4.7 12/31/2022 1121   RBC 5.45 12/31/2022 1121   HGB 13.3 12/31/2022 1121   HCT 43.4 12/31/2022 1121   PLT 323 12/31/2022 1121   MCV 79.6 (L) 12/31/2022 1121   MCH 24.4 (L) 12/31/2022 1121   MCHC 30.6 (L) 12/31/2022 1121   RDW 14.8 12/31/2022 1121   LYMPHSABS 757 (L) 12/31/2022 1121   MONOABS 611 10/15/2016 1123   EOSABS 80 12/31/2022 1121   BASOSABS 61 12/31/2022 1121    BMET    Component Value Date/Time   NA 142 12/31/2022 1121   K 4.7 12/31/2022 1121   CL 104 12/31/2022 1121   CO2 30 12/31/2022 1121   GLUCOSE 85 12/31/2022 1121   BUN 21 12/31/2022 1121   CREATININE 1.01 12/31/2022 1121   CALCIUM 10.0 12/31/2022 1121   GFRNONAA 68 08/08/2020 1215   GFRAA 79 08/08/2020 1215    BNP No results found for: "BNP"  ProBNP No results found for: "PROBNP"  Imaging: No results found.   Assessment & Plan:   1. Moderate persistent asthma without complication (Primary) - Ambulatory referral to Allergy     Asthma History of childhood asthma, asymptomatic for 50 years until exposure to dust and cat dander in January 2024. Symptoms of chest tightness and wheezing, particularly after exposure to dust or  mold lasted for 4 months. Currently using Wixela (generic Advair) as needed, approximately 1-2 times per week with good control of his asthma  symptoms. -Continue Wixela as needed. If usage increases to 3-4 times per week, return to daily dosing. -Continue Singulair 10mg  -Keep Albuterol on hand for acute flare-ups. -Refer to allergist for skin testing and potential allergy shots   Chronic Sinus Congestion Daily sinus congestion, worsened over the past year. -Continue Zyretc daily      Glenford Bayley, NP 03/26/2023

## 2023-04-05 ENCOUNTER — Ambulatory Visit: Payer: Medicare HMO | Admitting: Nurse Practitioner

## 2023-04-13 NOTE — Progress Notes (Signed)
 FOLLOW UP 3 MONTH  Assessment and Plan:   Hypertension Atypically elevated today; has been well controlled when checked sporadiacally Reduce stress, salt, animal protein; keep daily log rotating times at home and return for recheck NV in 2-4 weeks Monitor blood pressure at home; patient to call if consistently greater than 130/80 Continue DASH diet.   Reminder to go to the ER if any CP, SOB, nausea, dizziness, severe HA, changes vision/speech, left arm numbness and tingling and jaw pain.  Hyperlipidemia Currently at goal; continue statin Continue low cholesterol diet and exercise.  Check lipid panel.   Abnormal glucose Discussed diet/exercise, weight management  Check A1C q53m; check CMP  Overweight BMI 29 Long discussion about weight loss, diet, and exercise Recommended diet heavy in fruits and veggies and low in animal meats, cheeses, and dairy products, appropriate calorie intake Patient will work on increasing exercise; Will follow up in 3 months  Vitamin D  Def At goal at last visit; continue supplementation to maintain goal of 60-100 Defer Vit D level   Testosterone  deficency - continue to monitor, states medication is helping with symptoms of low T.  Androgel  3 pumps a day.  Severe coronary artery calcifications of three vessels Continue to follow with cardiology  Medication management -     CBC with Differential/Platelet -     COMPLETE METABOLIC PANEL WITH GFR -     Lipid panel -     TSH  Insomnia, unspecified type Continue Trazodone  , clonidine  and rare xanax  use Practice good sleep hygiene -     ALPRAZolam  (XANAX ) 0.5 MG tablet; TAKE 1/2-1 TAB DAILY IF NEEDED FOR ANXIETY ATTACK (MAX 5 DAYS/WEEK TO AVOID ADDICTION/DEMENTIA)     Further disposition pending results if labs check today. Discussed med's effects and SE's.   Over 30 minutes of face to face interview, exam, counseling, chart review, and critical decision making was performed.    Future  Appointments  Date Time Provider Department Center  04/30/2023  1:30 PM Tobie Arleta SQUIBB, MD [facility] None  05/27/2023 12:00 PM [address] [facility] None  07/08/2023 11:00 AM Tonita Fallow, MD [facility] None  10/11/2023 11:00 AM Laurice President, NP [facility] None    ----------------------------------------------------------------------------------------------------------------------  HPI 68 y.o. male  presents for 3 month follow up on hypertension, HTN, HLD, glucose management, weight and vitamin D  deficiency.    On Jan 04 2022 he fell off ladder approx 6 ft in the air.  Fractured shaft of right tibia and fibula. He had 1 surgical repair with a rod and screws in place. He no longer needs to take tamsulosin and finasteride. Good urine stream- gets up 1-2 times a night.  Follows with [facility]. SABRA He is moving his bowels ok with stool softener BID and miralax. Appetite is good. Currently 50% weight bearing on ball of foot and 100% on heel of foot. Goes back to Ortho 03/20/22  ON CT 01/04/22 severe three vessel coronary artery calcifications were discovered. Previous cardiac cath 09/05/03 no evidence of occlusive coronary disease was mild-mod calcification in proximal portion of cicumflex and left anterior descending.  Was done by Dr. Morgan.   He has constipation issues- uses stool softeners, benefiber. He does use Miralax occasionally. He drinks lots of water and eats good amount of fruits and vegetables.   He has history of migraines; doing well, was followed by Dr. Layman Meissner at headache clinic but no longer in network; reports migraines in clusters/spurts, variable. Will have several then none for 3-4 months.  Takes lyrica  200 mg BID as prophylactic (reduced from 25/month to current 5-8). Lyrica  last filled 01/29/23 #270. Will take ibuprofen  for mild which does work 75% of the time. Takes zomig  up to twice per week.  Nurtec didn't help. Has been on current regimen for 4-5 years, no changes  and works fairly well, requests we take over prescribing meds for now. Migraines did get worse at night since having the surgery and self cath.   He is prescribed xanax , takes 0.25 mg PRN at night for sleep, typically takes only 3-4 times per month, increased with stress. Has some stress r/t 52 year old mother, does have in home help. Alprazolam  last filled 09/14/22 # 30. Trying to wean off Alprazolam  and using Clonidine  and trazodone  for sleep and is working well and only rarely needs to use Xanax .   BMI is Body mass index is 29.38 kg/m., he has been working on diet and exercise,  Wt Readings from Last 3 Encounters:  04/14/23 193 lb 3.2 oz (87.6 kg)  03/26/23 190 lb (86.2 kg)  02/24/23 185 lb (83.9 kg)   HTN predates 1999.  Negative heart cath in 2008. His blood pressure has been controlled at home currently with Amlodipine  5 mg every day (110-120s/70-80s), , today their BP is BP: 130/70,  BP Readings from Last 3 Encounters:  04/14/23 130/70  03/26/23 124/70  01/18/23 130/80  He does workout. He denies chest pain, shortness of breath, dizziness.   He is on cholesterol medication (rosuvastatin  40 mg daily) and denies myalgias. His cholesterol is at goal. The cholesterol last visit was:   Lab Results  Component Value Date   CHOL 140 12/31/2022   HDL 58 12/31/2022   LDLCALC 64 12/31/2022   TRIG 94 12/31/2022   CHOLHDL 2.4 12/31/2022    He has been working on diet and exercise for hx of intermittent prediabetes (A1c 5.7% / 2011), and denies increased appetite, nausea, paresthesia of the feet, polydipsia, polyuria, visual disturbances and vomiting. Last A1C in the office was:  Lab Results  Component Value Date   HGBA1C 6.0 (H) 12/31/2022    Patient is on Vitamin D  supplement for deficiency and at goal last check:   Lab Results  Component Value Date   VD25OH 88 12/31/2022     He has a history of testosterone  deficiency (2005) and is on testosterone  replacement (gel). He states that  the testosterone  helps with his energy, libido, muscle mass. Lab Results  Component Value Date   TESTOSTERONE  612 12/31/2022    Current Outpatient Medications on File Prior to Visit  Medication Sig Dispense Refill   albuterol  (VENTOLIN  HFA) 108 (90 Base) MCG/ACT inhaler Inhale 2 puffs into the lungs every 6 (six) hours as needed for wheezing or shortness of breath. 8 g 2   ALPRAZolam  (XANAX ) 0.5 MG tablet TAKE 1/2-1 TAB DAILY IF NEEDED FOR ANXIETY ATTACK (MAX 5 DAYS/WEEK TO AVOID ADDICTION/DEMENTIA) 30 tablet 0   amLODipine  (NORVASC ) 5 MG tablet Take 1 tablet (5 mg total) by mouth daily. 90 tablet 3   Ascorbic Acid (VITAMIN C) 1000 MG tablet Take 1,000 mg by mouth 2 (two) times daily.     aspirin  EC 81 MG tablet Take 81 mg by mouth daily. Swallow whole.     Cholecalciferol (VITAMIN D -3) 5000 UNITS TABS Take 5,000 Units by mouth daily.      clobetasol  cream (TEMOVATE ) 0.05 % APPLY TO RASH TWICE DAILY AS NEEDED 60 g 3   cloNIDine  (CATAPRES ) 0.1 MG  tablet TAKE 1-2 TABS BY MOUTH. MAY REPEAT EVERY 45-60 MINS AS NEEDED, MAX 4 DOSES, UNTIL SYMPTOMS RESOLVE 90 tablet 0   [employer]S STOOL SOFTENER/LAXATIVE 8.6-50 MG tablet Take 1 tablet by mouth 2 (two) times daily.     fexofenadine (ALLEGRA) 180 MG tablet Take by mouth.     Flaxseed, Linseed, (FLAX SEED OIL PO) Take 1,000 mg by mouth 3 (three) times daily.      Glucosamine-Chondroitin (GLUCOSAMINE CHONDR COMPLEX PO) Take by mouth. Take 2 tablets daily     ibuprofen  (ADVIL ) 600 MG tablet Take 1 tablet (600 mg total) by mouth every 6 (six) hours as needed. 30 tablet 0   ipratropium (ATROVENT ) 0.06 % nasal spray Use 1 to 2 sprays each nostril 2 to 3 x /day as needed 45 mL 3   Magnesium  500 MG TABS Take 1 tablet (500 mg total) by mouth daily. 30 tablet 0   Melatonin 10 MG CAPS Take by mouth Nightly.     montelukast  (SINGULAIR ) 10 MG tablet TAKE 1 TABLET BY MOUTH EVERY DAY 90 tablet 3   Omega-3 Fatty Acids (FISH OIL PO) Take 1,000 mg by mouth 3 (three) times  daily.      pregabalin  (LYRICA ) 200 MG capsule Take  1 capsule  2 to 3 x /day  for Pain 270 capsule 1   Probiotic Product (ALIGN) 4 MG CAPS Take 1 capsule daily for Probiotic Benefit 90 capsule 1   rosuvastatin  (CRESTOR ) 40 MG tablet TAKE 1 TABLET BY MOUTH EVERY DAY FOR CHOLESTEROL 90 tablet 1   sildenafil (VIAGRA) 100 MG tablet Take 100 mg by mouth as needed.     Testosterone  10 MG/ACT (2%) GEL APPLY THE CONTENTS OF 4    PUMP ACTUATIONS DAILY TO   THIGHS 360 g 1   traZODone  (DESYREL ) 150 MG tablet Take  1/2 to 1 tablet   1 to 2 hours  before Bedtime  as needed for Sleep 90 tablet 3   vitamin B-12 (CYANOCOBALAMIN) 100 MCG tablet Take 100 mcg by mouth daily.     WIXELA INHUB 500-50 MCG/ACT AEPB INHALE 1 PUFF INTO THE LUNGS IN THE MORNING AND AT BEDTIME. 60 each 3   zolmitriptan  (ZOMIG ) 5 MG tablet TAKE 1 TABLET BY MOUTH AT ONSET OF HEADACHE; MAY REPEAT 1 TABLET IN 2 HOURS IF NEEDED. 24 tablet 2   azelastine  (ASTELIN ) 0.1 % nasal spray Place 2 sprays into both nostrils 2 (two) times daily as needed for rhinitis. Use in each nostril as directed 30 mL 12   fluticasone  (FLONASE ) 50 MCG/ACT nasal spray Place 1 spray into both nostrils daily. (Patient not taking: Reported on 04/14/2023) 11.1 mL 2   No current facility-administered medications on file prior to visit.     Allergies: No Known Allergies   Medical History:  Past Medical History:  Diagnosis Date   Acute medial meniscus tear of right knee 02/07/2021   Headache    Hyperlipidemia    Hypertension    Pre-diabetes    no meds   Vitamin D  deficiency    Family history- Reviewed and unchanged Social history- Reviewed and unchanged   Review of Systems:  Review of Systems  Constitutional:  Negative for chills, fever, malaise/fatigue and weight loss.  HENT:  Negative for congestion, hearing loss, sinus pain, sore throat and tinnitus.   Eyes:  Negative for blurred vision and double vision.  Respiratory:  Negative for cough, sputum  production, shortness of breath and wheezing.   Cardiovascular:  Negative  for chest pain, palpitations, orthopnea, claudication and leg swelling.  Gastrointestinal:  Negative for abdominal pain, blood in stool, constipation, diarrhea, heartburn, melena, nausea and vomiting.  Genitourinary:  Negative for dysuria and urgency.  Musculoskeletal:  Negative for back pain, falls, joint pain and myalgias.  Skin:  Negative for rash.  Neurological:  Positive for headaches (improved, stable). Negative for dizziness, tingling, sensory change and weakness.  Endo/Heme/Allergies:  Negative for environmental allergies and polydipsia.  Psychiatric/Behavioral:  Negative for depression and suicidal ideas. The patient is not nervous/anxious and does not have insomnia (controlled with use of trazodone  and clonidien with rare xanax  use).   All other systems reviewed and are negative.    Physical Exam: BP 130/70   Pulse (!) 51   Temp (!) 97.5 F (36.4 C)   Ht 5' 8 (1.727 m)   Wt 193 lb 3.2 oz (87.6 kg)   SpO2 97%   BMI 29.38 kg/m  Wt Readings from Last 3 Encounters:  04/14/23 193 lb 3.2 oz (87.6 kg)  03/26/23 190 lb (86.2 kg)  02/24/23 185 lb (83.9 kg)   General Appearance: Well nourished, in no apparent distress. Eyes: PERRLA, EOMs, conjunctiva no swelling or erythema Sinuses: No Frontal/maxillary tenderness ENT/Mouth: Ext aud canals clear, TMs without erythema, bulging. No erythema, swelling, or exudate on post pharynx.  Tonsils not swollen or erythematous. Hearing normal.  Neck: Supple, thyroid  normal.  Respiratory: Respiratory effort normal, BS equal bilaterally without rales, rhonchi, wheezing or stridor.  Cardio: RRR with no MRGs. Brisk peripheral pulses without edema.  Abdomen: Soft, + BS.  Non tender, no guarding, rebound, hernias, masses. Lymphatics: Non tender without lymphadenopathy.  Musculoskeletal: walk ing boot on right foot with less pressure on toe than heel, uses 1 crutch Skin:  Warm, dry without rashes, lesions, ecchymosis.  Neuro: Cranial nerves intact. No cerebellar symptoms.  Psych: Awake and oriented X 3, normal affect, Insight and Judgment appropriate.    Jayziah Bankhead E Darien Kading, NP 12:00 PM [facility]

## 2023-04-14 ENCOUNTER — Ambulatory Visit (INDEPENDENT_AMBULATORY_CARE_PROVIDER_SITE_OTHER): Payer: Medicare HMO | Admitting: Nurse Practitioner

## 2023-04-14 ENCOUNTER — Encounter: Payer: Self-pay | Admitting: Nurse Practitioner

## 2023-04-14 VITALS — BP 130/70 | HR 51 | Temp 97.5°F | Ht 68.0 in | Wt 193.2 lb

## 2023-04-14 DIAGNOSIS — I2510 Atherosclerotic heart disease of native coronary artery without angina pectoris: Secondary | ICD-10-CM | POA: Diagnosis not present

## 2023-04-14 DIAGNOSIS — E782 Mixed hyperlipidemia: Secondary | ICD-10-CM | POA: Diagnosis not present

## 2023-04-14 DIAGNOSIS — Z79899 Other long term (current) drug therapy: Secondary | ICD-10-CM | POA: Diagnosis not present

## 2023-04-14 DIAGNOSIS — R7309 Other abnormal glucose: Secondary | ICD-10-CM | POA: Diagnosis not present

## 2023-04-14 DIAGNOSIS — I10 Essential (primary) hypertension: Secondary | ICD-10-CM

## 2023-04-14 DIAGNOSIS — E663 Overweight: Secondary | ICD-10-CM

## 2023-04-14 DIAGNOSIS — G4700 Insomnia, unspecified: Secondary | ICD-10-CM | POA: Diagnosis not present

## 2023-04-14 DIAGNOSIS — E559 Vitamin D deficiency, unspecified: Secondary | ICD-10-CM

## 2023-04-14 DIAGNOSIS — E349 Endocrine disorder, unspecified: Secondary | ICD-10-CM | POA: Diagnosis not present

## 2023-04-14 MED ORDER — ALPRAZOLAM 0.5 MG PO TABS: ORAL_TABLET | ORAL | 0 refills | Status: AC

## 2023-04-14 NOTE — Patient Instructions (Signed)

## 2023-04-15 LAB — CBC WITH DIFFERENTIAL/PLATELET
Absolute Lymphocytes: 1230 {cells}/uL (ref 850–3900)
Absolute Monocytes: 509 {cells}/uL (ref 200–950)
Basophils Absolute: 48 {cells}/uL (ref 0–200)
Basophils Relative: 0.9 %
Eosinophils Absolute: 201 {cells}/uL (ref 15–500)
Eosinophils Relative: 3.8 %
HCT: 39.7 % (ref 38.5–50.0)
Hemoglobin: 11.6 g/dL — ABNORMAL LOW (ref 13.2–17.1)
MCH: 22.8 pg — ABNORMAL LOW (ref 27.0–33.0)
MCHC: 29.2 g/dL — ABNORMAL LOW (ref 32.0–36.0)
MCV: 78 fL — ABNORMAL LOW (ref 80.0–100.0)
MPV: 10.3 fL (ref 7.5–12.5)
Monocytes Relative: 9.6 %
Neutro Abs: 3313 {cells}/uL (ref 1500–7800)
Neutrophils Relative %: 62.5 %
Platelets: 322 10*3/uL (ref 140–400)
RBC: 5.09 10*6/uL (ref 4.20–5.80)
RDW: 14.7 % (ref 11.0–15.0)
Total Lymphocyte: 23.2 %
WBC: 5.3 10*3/uL (ref 3.8–10.8)

## 2023-04-15 LAB — COMPLETE METABOLIC PANEL WITH GFR
AG Ratio: 2 (calc) (ref 1.0–2.5)
ALT: 24 U/L (ref 9–46)
AST: 29 U/L (ref 10–35)
Albumin: 4.6 g/dL (ref 3.6–5.1)
Alkaline phosphatase (APISO): 122 U/L (ref 35–144)
BUN: 22 mg/dL (ref 7–25)
CO2: 26 mmol/L (ref 20–32)
Calcium: 9.3 mg/dL (ref 8.6–10.3)
Chloride: 105 mmol/L (ref 98–110)
Creat: 0.89 mg/dL (ref 0.70–1.35)
Globulin: 2.3 g/dL (ref 1.9–3.7)
Glucose, Bld: 92 mg/dL (ref 65–99)
Potassium: 4.6 mmol/L (ref 3.5–5.3)
Sodium: 141 mmol/L (ref 135–146)
Total Bilirubin: 0.3 mg/dL (ref 0.2–1.2)
Total Protein: 6.9 g/dL (ref 6.1–8.1)
eGFR: 95 mL/min/{1.73_m2} (ref >=60)

## 2023-04-15 LAB — LIPID PANEL
Cholesterol: 119 mg/dL (ref <200)
HDL: 56 mg/dL (ref >=40)
LDL Cholesterol (Calc): 48 mg/dL
Non-HDL Cholesterol (Calc): 63 mg/dL (ref <130)
Total CHOL/HDL Ratio: 2.1 (calc) (ref <5.0)
Triglycerides: 71 mg/dL (ref <150)

## 2023-04-15 LAB — TSH: TSH: 1.07 m[IU]/L (ref 0.40–4.50)

## 2023-04-21 ENCOUNTER — Other Ambulatory Visit: Payer: Self-pay | Admitting: Nurse Practitioner

## 2023-04-21 DIAGNOSIS — J4521 Mild intermittent asthma with (acute) exacerbation: Secondary | ICD-10-CM

## 2023-04-21 DIAGNOSIS — Z9109 Other allergy status, other than to drugs and biological substances: Secondary | ICD-10-CM

## 2023-04-30 ENCOUNTER — Ambulatory Visit (INDEPENDENT_AMBULATORY_CARE_PROVIDER_SITE_OTHER): Payer: Medicare HMO | Admitting: Internal Medicine

## 2023-04-30 ENCOUNTER — Encounter: Payer: Self-pay | Admitting: Internal Medicine

## 2023-04-30 VITALS — BP 150/70 | HR 67 | Temp 98.1°F | Resp 12 | Ht 65.0 in | Wt 191.0 lb

## 2023-04-30 DIAGNOSIS — J4540 Moderate persistent asthma, uncomplicated: Secondary | ICD-10-CM

## 2023-04-30 DIAGNOSIS — J3089 Other allergic rhinitis: Secondary | ICD-10-CM | POA: Diagnosis not present

## 2023-04-30 MED ORDER — LEVOCETIRIZINE DIHYDROCHLORIDE 5 MG PO TABS: 5.0000 mg | ORAL_TABLET | Freq: Every evening | ORAL | 5 refills | Status: DC

## 2023-04-30 NOTE — Progress Notes (Signed)
NEW PATIENT  Date of Service/Encounter:  04/30/23  Consult requested by: Lucky Cowboy, MD   Subjective:   Gerald [name] (DOB: 1955/11/06) is a 68 y.o. male who presents to the clinic on 04/30/2023 with a chief complaint of Establish Care .    History obtained from: chart review and patient.   Asthma:  Diagnosed in childhood. Reports asthma had done well as he got older- played sports throughout and was even running until 2022 when he had meniscus/leg fracture.  However, around January 2024, had exposure to cats/dust/mold while helping family member clean/move and then had trouble with chest and throat tightness requiring multiple courses of oral prednisone until May 2024; treated for tracheitis.  Was also started on Wixela with Pulm Dr Judeth Horn. This has since resolved and he uses the Wixela PRN, never really Albuterol.  He is still having congestion and post nasal drip so was referred to [place].  Sometimes  daytime symptoms in past month, none nighttime awakenings in past month Using rescue inhaler: rarely  Limitations to daily activity: none 0 ED visits/UC visits and 3 oral steroids in the past year 0 number of lifetime hospitalizations, 0 number of lifetime intubations.  Identified Triggers:  allergies Prior PFTs or spirometry: 09/2022 spirometry with ratio of 84%  Current regimen:  Maintenance: Wixela PRN on and off depending on symptoms, usually a few times a week  Rescue: Albuterol 2 puffs q4-6 hrs PRN  Rhinitis:  Started since he was young.  Symptoms include: nasal congestion, rhinorrhea, and post nasal drainage  Occurs year-round Potential triggers: not sure exactly; molds   Treatments tried:  AIT age 6-12; again about 25 years ago for about 3-4 years.   Flonase/Zyrtec in the past  Azelastine daily Ipratropium PRN  Singulair  Allegra  Last use of anti histamines was this AM  Previous allergy testing: yes about 25 years ago History of sinus surgery: yes; 1980s  septal deviation repair  Nonallergic triggers: none    Reviewed:  03/15/2023: allergen panel negative to DM, cats, dogs, CR, grasses, molds, trees, weeds   03/26/2023: seen by Clent Ridges NP Pulm for asthma and allergic rhinitis. Previously on AIT in childhood. On Wixela PRN, Singulair daily, Zyrtec daily.  Referred to Allergy for further workup.  02/24/2023: seen by ENT Dr Allena Katz for rhinitis, congestion, septal deviation. Discussed allergy testing.  Start Flonase, Azelastine, Ipratropium.  01/18/2023: seen by Cardiology Dr Servando Salina for coronary calcification, HTN, HLD, preDM, sinus bradycardia. No prior cardiac interventions.  On Crestor and amlodipine.   Past Medical History: Past Medical History:  Diagnosis Date   Acute medial meniscus tear of right knee 02/07/2021   Asthma    Headache    Hyperlipidemia    Hypertension    Pre-diabetes    no meds   Vitamin D deficiency     Past Surgical History: Past Surgical History:  Procedure Laterality Date   ESOPHAGOGASTRODUODENOSCOPY     KNEE ARTHROSCOPY WITH MEDIAL MENISECTOMY Right 02/20/2021   Procedure: KNEE ARTHROSCOPY WITH MEDIAL MENISECTOMY;  Surgeon: Salvatore Marvel, MD;  Location: [facility];  Service: Orthopedics;  Laterality: Right;   KNEE SURGERY     left   ORIF ACETABULAR FRACTURE     left arm 2 times    Family History: Family History  Problem Relation Age of Onset   Hypertension Mother    Hyperlipidemia Mother    Diabetes Mother    Allergic rhinitis Father    Hypertension Father     Social  History:  Flooring in bedroom: wood Pets: cat and dog Tobacco use/exposure: none Job: retired   Medication List:  Allergies as of 04/30/2023   No Known Allergies      Medication List        Accurate as of April 30, 2023  2:19 PM. If you have any questions, ask your nurse or doctor.          albuterol 108 (90 Base) MCG/ACT inhaler Commonly known as: VENTOLIN HFA Inhale 2 puffs into the lungs every 6  (six) hours as needed for wheezing or shortness of breath.   Align 4 MG Caps Take 1 capsule daily for Probiotic Benefit   ALPRAZolam 0.5 MG tablet Commonly known as: XANAX TAKE 1/2-1 TAB DAILY IF NEEDED FOR ANXIETY ATTACK (MAX 5 DAYS/WEEK TO AVOID ADDICTION/DEMENTIA)   amLODipine 5 MG tablet Commonly known as: NORVASC Take 1 tablet (5 mg total) by mouth daily.   aspirin EC 81 MG tablet Take 81 mg by mouth daily. Swallow whole.   azelastine 0.1 % nasal spray Commonly known as: ASTELIN Place 2 sprays into both nostrils 2 (two) times daily as needed for rhinitis. Use in each nostril as directed   clobetasol cream 0.05 % Commonly known as: TEMOVATE APPLY TO RASH TWICE DAILY AS NEEDED   cloNIDine 0.1 MG tablet Commonly known as: CATAPRES TAKE 1-2 TABS BY MOUTH. MAY REPEAT EVERY 45-60 MINS AS NEEDED, MAX 4 DOSES, UNTIL SYMPTOMS RESOLVE   [employer] Stool Softener/Laxative 8.6-50 MG tablet Generic drug: senna-docusate Take 1 tablet by mouth 2 (two) times daily.   fexofenadine 180 MG tablet Commonly known as: ALLEGRA Take by mouth.   FISH OIL PO Take 1,000 mg by mouth 3 (three) times daily.   FLAX SEED OIL PO Take 1,000 mg by mouth 3 (three) times daily.   fluticasone 50 MCG/ACT nasal spray Commonly known as: FLONASE SPRAY 1 SPRAY INTO BOTH NOSTRILS DAILY.   GLUCOSAMINE CHONDR COMPLEX PO Take by mouth. Take 2 tablets daily   ibuprofen 600 MG tablet Commonly known as: ADVIL Take 1 tablet (600 mg total) by mouth every 6 (six) hours as needed.   ipratropium 0.06 % nasal spray Commonly known as: ATROVENT Use 1 to 2 sprays each nostril 2 to 3 x /day as needed   Magnesium 500 MG Tabs Take 1 tablet (500 mg total) by mouth daily.   Melatonin 10 MG Caps Take by mouth Nightly.   montelukast 10 MG tablet Commonly known as: SINGULAIR TAKE 1 TABLET BY MOUTH EVERY DAY   pregabalin 200 MG capsule Commonly known as: LYRICA Take  1 capsule  2 to 3 x /day  for Pain    rosuvastatin 40 MG tablet Commonly known as: CRESTOR TAKE 1 TABLET BY MOUTH EVERY DAY FOR CHOLESTEROL   sildenafil 100 MG tablet Commonly known as: VIAGRA Take 100 mg by mouth as needed.   Testosterone 10 MG/ACT (2%) Gel APPLY THE CONTENTS OF 4    PUMP ACTUATIONS DAILY TO   THIGHS   traZODone 150 MG tablet Commonly known as: DESYREL Take  1/2 to 1 tablet   1 to 2 hours  before Bedtime  as needed for Sleep   vitamin B-12 100 MCG tablet Commonly known as: CYANOCOBALAMIN Take 100 mcg by mouth daily.   vitamin C 1000 MG tablet Take 1,000 mg by mouth 2 (two) times daily.   Vitamin D-3 125 MCG (5000 UT) Tabs Take 5,000 Units by mouth daily.   Wixela Inhub 500-50 MCG/ACT Aepb  Generic drug: fluticasone-salmeterol INHALE 1 PUFF INTO THE LUNGS IN THE MORNING AND AT BEDTIME.   zolmitriptan 5 MG tablet Commonly known as: ZOMIG TAKE 1 TABLET BY MOUTH AT ONSET OF HEADACHE; MAY REPEAT 1 TABLET IN 2 HOURS IF NEEDED.         REVIEW OF SYSTEMS: Pertinent positives and negatives discussed in HPI.   Objective:   Physical Exam: BP (!) 150/70   Pulse 67   Temp 98.1 F (36.7 C) (Temporal)   Resp 12   Ht 5\' 5"  (1.651 m)   Wt 191 lb (86.6 kg)   SpO2 95%   BMI 31.78 kg/m  Body mass index is 31.78 kg/m. GEN: alert, well developed HEENT: clear conjunctiva,  nose with + mild inferior turbinate hypertrophy, pink nasal mucosa, + clear rhinorrhea, + cobblestoning HEART: regular rate and rhythm, no murmur LUNGS: clear to auscultation bilaterally, no coughing, unlabored respiration ABDOMEN: soft, non distended  SKIN: no rashes or lesions  Spirometry:  Tracings reviewed. His effort: Good reproducible efforts. FVC: 3.08L, 84% predicted FEV1: 2.38L, 84% predicted FEV1/FVC ratio: 77% Interpretation: Spirometry consistent with normal pattern.  Please see scanned spirometry results for details.  Assessment:   1. Other allergic rhinitis   2. Moderate persistent asthma without  complication     Plan/Recommendations:  Other Allergic Rhinitis: - Due to turbinate hypertrophy, prior treatment with AIT, history of asthma and unresponsive to over the counter meds, will perform skin testing to identify aeroallergen triggers.   - Use nasal saline rinses before nose sprays such as with Neilmed Sinus Rinse.  Use distilled water.   - Use Azelastine 2 sprays each nostril twice daily as needed for runny nose, drainage, sneezing, congestion. Aim upward and outward. - Use Ipratroprium 1-2 sprays up to four times daily as needed for runny nose. Aim upward and outward. - Use Xyzal 5mg  daily. Stop Allegra.   - Use Singulair 10mg  daily. Stop if there are any mood/behavioral changes.  Moderate Persistent Asthma: - Continue follow up with Pulmonology. Spirometry today was normal.  - Maintenance inhaler: on Wixela 1-2 puffs daily as needed.  - Rescue inhaler: Albuterol 2 puffs via spacer or 1 vial via nebulizer every 4-6 hours as needed for respiratory symptoms of cough, shortness of breath, or wheezing Asthma control goals:  Full participation in all desired activities (may need albuterol before activity) Albuterol use two times or less a week on average (not counting use with activity) Cough interfering with sleep two times or less a month Oral steroids no more than once a year No hospitalizations  Hold all anti-histamines (Xyzal, Allegra, Zyrtec, Claritin, Benadryl, Pepcid, Azelastine spray) 3 days prior to next visit.   Follow up: 1/30 at 10 AM for skin testing 1-55, IDs okay    Alesia Morin, MD [facility]

## 2023-04-30 NOTE — Patient Instructions (Addendum)
Other Allergic Rhinitis: - Use nasal saline rinses before nose sprays such as with Neilmed Sinus Rinse.  Use distilled water.   - Use Azelastine 2 sprays each nostril twice daily as needed for runny nose, drainage, sneezing, congestion. Aim upward and outward. - Use Ipratroprium 1-2 sprays up to four times daily as needed for runny nose. Aim upward and outward. - Use Xyzal 5mg  daily. Stop Allegra.   - Use Singulair 10mg  daily. Stop if there are any mood/behavioral changes.  Moderate Persistent Asthma: - Continue follow up with Pulmonology.  - Maintenance inhaler: on Wixela 1-2 puffs daily as needed.  - Rescue inhaler: Albuterol 2 puffs via spacer or 1 vial via nebulizer every 4-6 hours as needed for respiratory symptoms of cough, shortness of breath, or wheezing Asthma control goals:  Full participation in all desired activities (may need albuterol before activity) Albuterol use two times or less a week on average (not counting use with activity) Cough interfering with sleep two times or less a month Oral steroids no more than once a year No hospitalizations  Hold all anti-histamines (Xyzal, Allegra, Zyrtec, Claritin, Benadryl, Pepcid, Azelastine spray) 3 days prior to next visit.   Follow up: 1/30 at 10 AM for skin testing 1-55

## 2023-05-05 DIAGNOSIS — I739 Peripheral vascular disease, unspecified: Secondary | ICD-10-CM | POA: Diagnosis not present

## 2023-05-05 DIAGNOSIS — Z833 Family history of diabetes mellitus: Secondary | ICD-10-CM | POA: Diagnosis not present

## 2023-05-05 DIAGNOSIS — Z008 Encounter for other general examination: Secondary | ICD-10-CM | POA: Diagnosis not present

## 2023-05-05 DIAGNOSIS — E785 Hyperlipidemia, unspecified: Secondary | ICD-10-CM | POA: Diagnosis not present

## 2023-05-05 DIAGNOSIS — J4489 Other specified chronic obstructive pulmonary disease: Secondary | ICD-10-CM | POA: Diagnosis not present

## 2023-05-05 DIAGNOSIS — I700 Atherosclerosis of aorta: Secondary | ICD-10-CM | POA: Diagnosis not present

## 2023-05-05 DIAGNOSIS — Z8249 Family history of ischemic heart disease and other diseases of the circulatory system: Secondary | ICD-10-CM | POA: Diagnosis not present

## 2023-05-05 DIAGNOSIS — N400 Enlarged prostate without lower urinary tract symptoms: Secondary | ICD-10-CM | POA: Diagnosis not present

## 2023-05-05 DIAGNOSIS — K5900 Constipation, unspecified: Secondary | ICD-10-CM | POA: Diagnosis not present

## 2023-05-05 DIAGNOSIS — F419 Anxiety disorder, unspecified: Secondary | ICD-10-CM | POA: Diagnosis not present

## 2023-05-05 DIAGNOSIS — N529 Male erectile dysfunction, unspecified: Secondary | ICD-10-CM | POA: Diagnosis not present

## 2023-05-05 DIAGNOSIS — I129 Hypertensive chronic kidney disease with stage 1 through stage 4 chronic kidney disease, or unspecified chronic kidney disease: Secondary | ICD-10-CM | POA: Diagnosis not present

## 2023-05-05 DIAGNOSIS — M1990 Unspecified osteoarthritis, unspecified site: Secondary | ICD-10-CM | POA: Diagnosis not present

## 2023-05-06 ENCOUNTER — Ambulatory Visit (INDEPENDENT_AMBULATORY_CARE_PROVIDER_SITE_OTHER): Payer: Medicare HMO | Admitting: Internal Medicine

## 2023-05-06 DIAGNOSIS — J3089 Other allergic rhinitis: Secondary | ICD-10-CM | POA: Diagnosis not present

## 2023-05-06 MED ORDER — FLUTICASONE PROPIONATE 50 MCG/ACT NA SUSP: 2.0000 | Freq: Every day | NASAL | 5 refills | Status: DC

## 2023-05-06 MED ORDER — IPRATROPIUM BROMIDE 0.06 % NA SOLN: 1.0000 | Freq: Three times a day (TID) | NASAL | 5 refills | Status: DC | PRN

## 2023-05-06 MED ORDER — AZELASTINE HCL 0.1 % NA SOLN: 2.0000 | Freq: Two times a day (BID) | NASAL | 5 refills | Status: DC | PRN

## 2023-05-06 NOTE — Progress Notes (Signed)
FOLLOW UP Date of Service/Encounter:  05/06/23   Subjective:  Gerald [name] (DOB: 1956-03-10) is a 68 y.o. male who [facility] for follow up for skin testing.   History obtained from: chart review and patient.  Anti histamines held.   Past Medical History: Past Medical History:  Diagnosis Date   Acute medial meniscus tear of right knee 02/07/2021   Asthma    Headache    Hyperlipidemia    Hypertension    Pre-diabetes    no meds   Vitamin D deficiency     Objective:  There were no vitals taken for this visit. There is no height or weight on file to calculate BMI. Physical Exam: GEN: alert, well developed HEENT: clear conjunctiva, MMM LUNGS: unlabored respiration  Skin Testing:  Skin prick testing was placed, which includes aeroallergens/foods, histamine control, and saline control.  Verbal consent was obtained prior to placing test.  Patient tolerated procedure well.  Allergy testing results were read and interpreted by myself, documented by clinical staff. Adequate positive and negative control.  Positive results to:  Results discussed with patient/family.  Airborne Adult Perc - 05/06/23 1017     Time Antigen Placed 1017    Allergen Manufacturer Waynette Buttery    Location Back    Number of Test 55    1. Control-Buffer 50% Glycerol Negative    2. Control-Histamine 3+    3. Bahia Negative    4. French Southern Territories Negative    5. Johnson Negative    6. Kentucky Blue Negative    7. Meadow Fescue Negative    8. Perennial Rye Negative    9. Timothy Negative    10. Ragweed Mix Negative    11. Cocklebur Negative    12. Plantain,  English Negative    13. Baccharis Negative    14. Dog Fennel Negative    15. Russian Thistle Negative    16. Lamb's Quarters Negative    17. Sheep Sorrell Negative    18. Rough Pigweed Negative    19. Marsh Elder, Rough Negative    20. Mugwort, Common Negative    21. Box, Elder Negative    22. Cedar, red  Negative    23. Sweet Gum Negative    24. Pecan Pollen Negative    25. Pine Mix Negative    26. Walnut, Black Pollen Negative    27. Red Mulberry Negative    28. Ash Mix Negative    29. Birch Mix Negative    30. Beech American Negative    31. Cottonwood, Guinea-Bissau Negative    32. Hickory, White Negative    33. Maple Mix Negative    34. Oak, Guinea[place] Mix Negative    35. Sycamore Eastern Negative    36. Alternaria Alternata Negative    37. Cladosporium Herbarum Negative    38. Aspergillus Mix Negative    39. Penicillium Mix Negative    40. Bipolaris Sorokiniana (Helminthosporium) Negative    41. Drechslera Spicifera (Curvularia) Negative    42. Mucor Plumbeus Negative    43. Fusarium Moniliforme Negative    44. Aureobasidium Pullulans (pullulara) Negative    45. Rhizopus Oryzae Negative    46. Botrytis Cinera Negative    47. Epicoccum Nigrum Negative    48. Phoma Betae Negative    49. Dust Mite Mix Negative    50. Cat Hair 10,000 BAU/ml Negative    51.  Dog Epithelia Negative    52. Mixed Feathers  Negative    53. Horse Epithelia Negative    54. Cockroach, German Negative    55. Tobacco Leaf Negative             Intradermal - 05/06/23 1133     Time Antigen Placed 1133    Allergen Manufacturer Waynette Buttery    Location Arm    Number of Test 16    Intradermal Select    Control Negative    Bahia Negative    French Southern Territories Negative    Johnson Negative    7 Grass Negative    Ragweed Mix Negative    Weed Mix Negative    Tree Mix Negative    Mold 1 Negative    Mold 2 Negative    Mold 3 Negative    Mold 4 Negative    Mite Mix Negative    Cat Negative    Dog Negative    Cockroach Negative              Assessment:   1. Other allergic rhinitis     Plan/Recommendations:   Other Allergic Rhinitis: - Due to turbinate hypertrophy, prior treatment with AIT, history of asthma and unresponsive to over the counter meds, will perform skin testing to identify aeroallergen  triggers.  - SPT 04/2023: negative   - Use nasal saline rinses before nose sprays such as with Neilmed Sinus Rinse.  Use distilled water.  - Use Flonase 2 sprays each nostril daily.  Aim upward and outward.  - Use Azelastine 2 sprays each nostril twice daily as needed for congestion/drainage/runny nose. Aim upward and outward. - Use Ipratroprium 1-2 sprays up to four times daily as needed for runny nose. Aim upward and outward. - Use Xyzal 5mg  daily. If you notice no improvement in 2 weeks, can switch to as needed use for runny nose, sneezing rather than daily.  - Stop Singulair.   Moderate Persistent Asthma: - Continue follow up with Pulmonology.  - Maintenance inhaler: on Wixela 1-2 puffs daily as needed.  - Rescue inhaler: Albuterol 2 puffs via spacer or 1 vial via nebulizer every 4-6 hours as needed for respiratory symptoms of cough, shortness of breath, or wheezing Asthma control goals:  Full participation in all desired activities (may need albuterol before activity) Albuterol use two times or less a week on average (not counting use with activity) Cough interfering with sleep two times or less a month Oral steroids no more than once a year No hospitalizations    Return in about 3 months (around 08/04/2023).  Alesia Morin, MD [facility]

## 2023-05-06 NOTE — Patient Instructions (Addendum)
Chronic Rhinitis: - Use nasal saline rinses before nose sprays such as with Neilmed Sinus Rinse.  Use distilled water.  - SPT 04/2023: negative   - Use Flonase 2 sprays each nostril daily.  Aim upward and outward.  - Use Azelastine 2 sprays each nostril twice daily as needed for congestion/drainage/runny nose. Aim upward and outward. - Use Ipratroprium 1-2 sprays up to three times daily as needed for runny nose. Aim upward and outward. - Use Xyzal 5mg  daily. If you notice no improvement in 2 weeks, can switch to as needed use for runny nose, sneezing rather than daily.  - Stop Singulair.   Moderate Persistent Asthma: - Continue follow up with Pulmonology.  - Maintenance inhaler: on Wixela 1-2 puffs daily as needed.  - Rescue inhaler: Albuterol 2 puffs via spacer or 1 vial via nebulizer every 4-6 hours as needed for respiratory symptoms of cough, shortness of breath, or wheezing Asthma control goals:  Full participation in all desired activities (may need albuterol before activity) Albuterol use two times or less a week on average (not counting use with activity) Cough interfering with sleep two times or less a month Oral steroids no more than once a year No hospitalizations

## 2023-05-18 ENCOUNTER — Other Ambulatory Visit: Payer: Self-pay | Admitting: Pulmonary Disease

## 2023-05-20 IMAGING — MR MR KNEE*R* W/O CM
6 series · 40 of 40 positions shown · non-contrast
Comparison: None.

CLINICAL DATA: Patient complains of off and on right medial knee
pain with occasionally swelling and painful range of motion. Patient
reports in [REDACTED] he was running and his right knee started hurting.
No known injury. No history of surgery reported.

EXAM:
MRI OF THE RIGHT KNEE WITHOUT CONTRAST
TECHNIQUE: Multiplanar, multisequence MR imaging of the knee was performed. No
intravenous contrast was administered.

[Series 3: T2 fat-sat · axial · 4.0mm · 0.62mm/px · z∈[-90,+80]mm · 8 of 35 slices shown (1 of 3)]
[im 1/35]
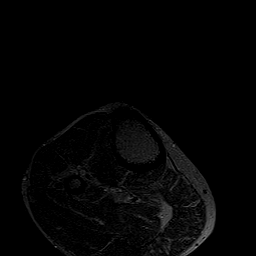
[im 5/35]
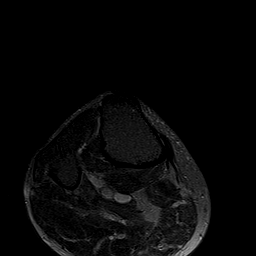
[im 10/35]
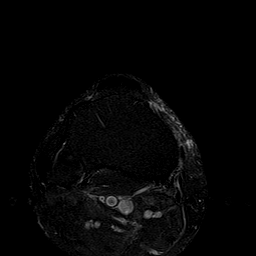
[im 15/35]
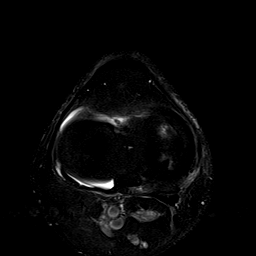
[im 20/35]
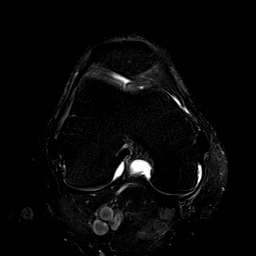
[im 25/35]
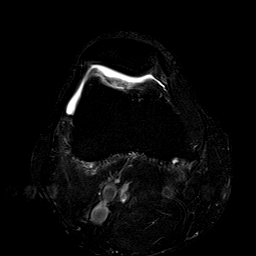
[im 30/35]
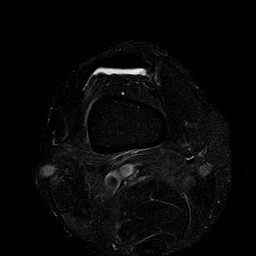
[im 35/35]
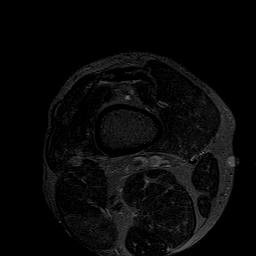

[Series 4: T1 · coronal · 4.0mm · 0.59mm/px · 6 of 25 slices shown]
[im 1/25]
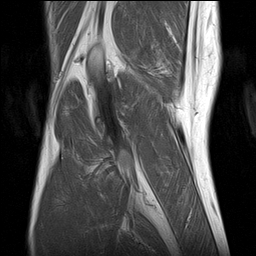
[im 5/25]
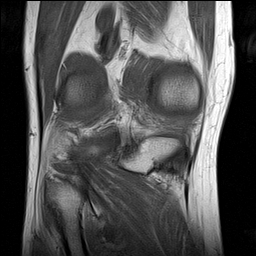
[im 10/25]
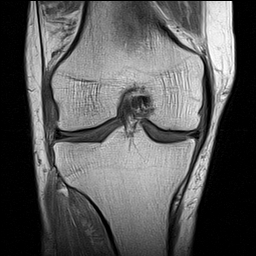
[im 15/25]
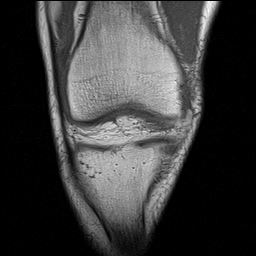
[im 20/25]
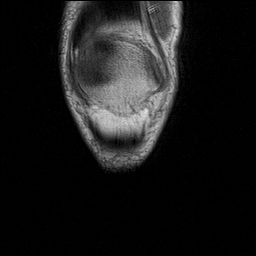
[im 25/25]
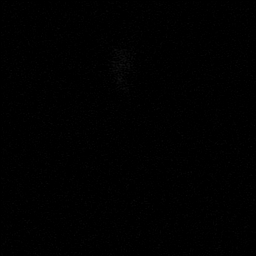

[Series 5: PD fat-sat · sagittal · 3.0mm · 0.59mm/px · 7 of 28 slices shown (1 of 2)]
[im 1/28]
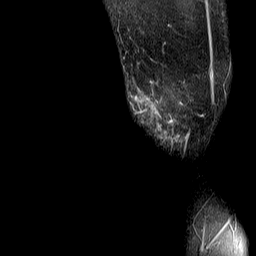
[im 5/28]
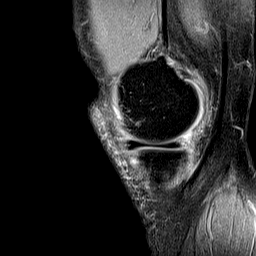
[im 10/28]
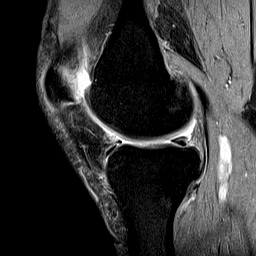
[im 14/28]
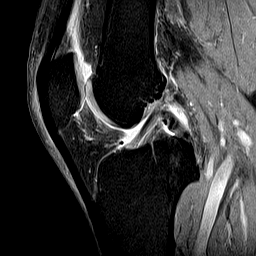
[im 19/28]
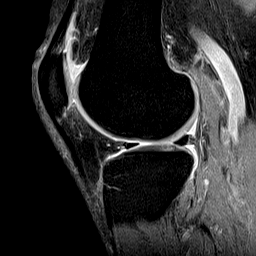
[im 23/28]
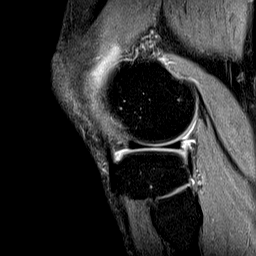
[im 28/28]
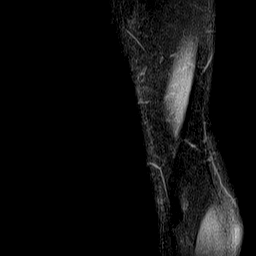

[Series 6: T2 fat-sat · coronal · 4.0mm · 0.59mm/px · 6 of 25 slices shown (2 of 3)]
[im 1/25]
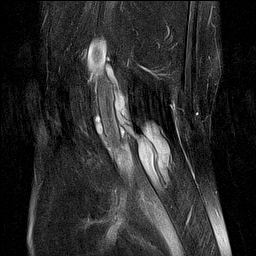
[im 5/25]
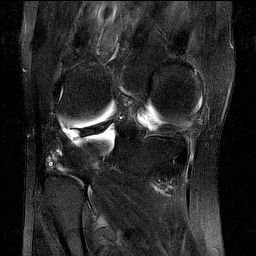
[im 10/25]
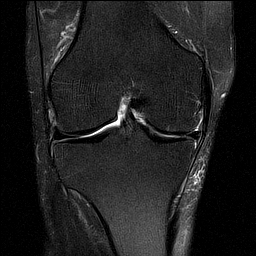
[im 15/25]
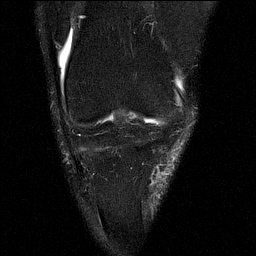
[im 20/25]
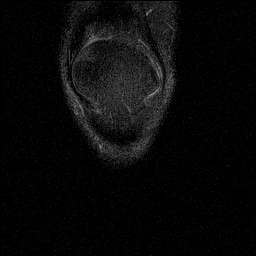
[im 25/25]
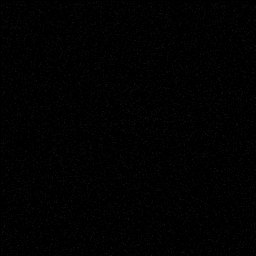

[Series 7: PD fat-sat · coronal · 4.0mm · 0.59mm/px · 6 of 25 slices shown (2 of 2)]
[im 1/25]
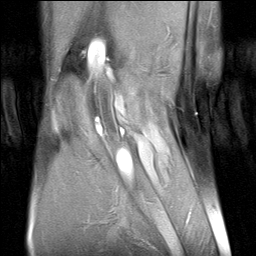
[im 5/25]
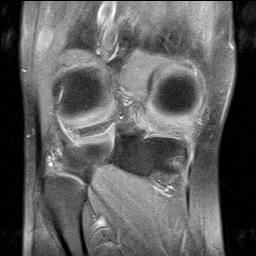
[im 10/25]
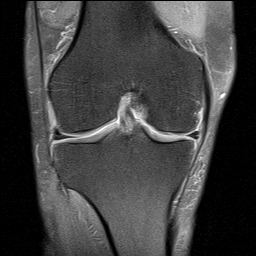
[im 15/25]
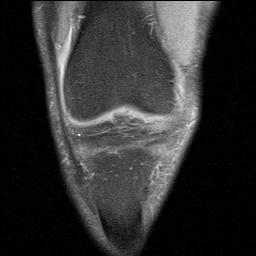
[im 20/25]
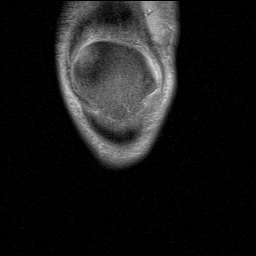
[im 25/25]
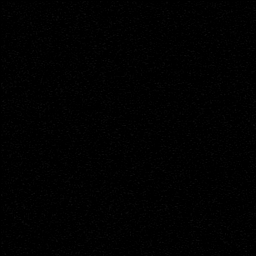

[Series 8: T2 fat-sat · sagittal · 3.0mm · 0.59mm/px · 7 of 28 slices shown (3 of 3)]
[im 1/28]
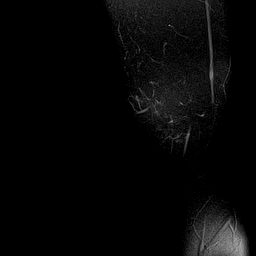
[im 5/28]
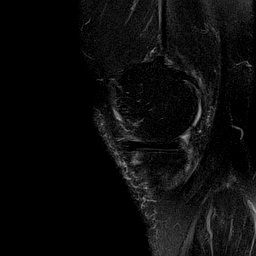
[im 10/28]
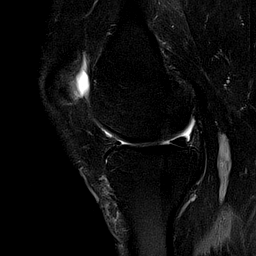
[im 14/28]
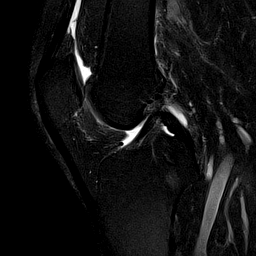
[im 19/28]
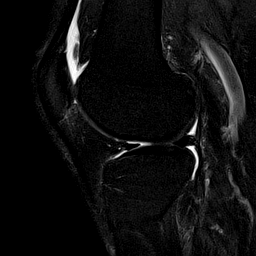
[im 23/28]
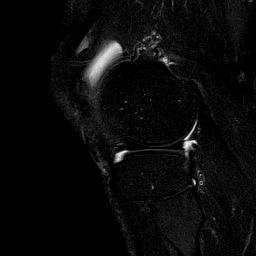
[im 28/28]
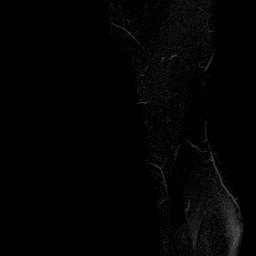

[40 of 40 positions shown; findings below may reference images not displayed]

FINDINGS: MENISCI

Medial: Complex tear of the posterior horn of the medial meniscus
and peripheral meniscal extrusion.

Lateral: Intact.

LIGAMENTS

Cruciates: ACL and PCL are intact.

Collaterals: Medial collateral ligament is intact. Lateral
collateral ligament complex is intact.

CARTILAGE

Patellofemoral: Partial-thickness cartilage loss of the trochlear
groove.

Medial: Partial-thickness cartilage loss of the medial femorotibial
compartment.

Lateral:  No chondral defect.

JOINT: Small joint effusion. Normal Felner Ceola. No plical
thickening.

POPLITEAL FOSSA: Popliteus tendon is intact. No Baker's cyst.

EXTENSOR MECHANISM: Intact quadriceps tendon. Intact patellar
tendon. Intact lateral patellar retinaculum. Intact medial patellar
retinaculum. Intact MPFL.

BONES: No aggressive osseous lesion. No fracture or dislocation.

Other: No fluid collection or hematoma. Muscles are normal.
IMPRESSION: 1. Complex tear of the posterior horn of the medial meniscus and
peripheral meniscal extrusion.
2. Partial-thickness cartilage loss of the trochlear groove.
3. Partial-thickness cartilage loss of the medial femorotibial
compartment.

## 2023-05-26 ENCOUNTER — Telehealth (INDEPENDENT_AMBULATORY_CARE_PROVIDER_SITE_OTHER): Payer: Self-pay | Admitting: Otolaryngology

## 2023-05-26 NOTE — Telephone Encounter (Signed)
Confirmed appt and address with patient for 05/27/2023.

## 2023-05-27 ENCOUNTER — Ambulatory Visit (INDEPENDENT_AMBULATORY_CARE_PROVIDER_SITE_OTHER): Payer: Medicare HMO | Admitting: Otolaryngology

## 2023-05-27 VITALS — BP 123/76 | HR 68

## 2023-05-27 DIAGNOSIS — R0982 Postnasal drip: Secondary | ICD-10-CM | POA: Diagnosis not present

## 2023-05-27 DIAGNOSIS — R0981 Nasal congestion: Secondary | ICD-10-CM | POA: Diagnosis not present

## 2023-05-27 DIAGNOSIS — J342 Deviated nasal septum: Secondary | ICD-10-CM | POA: Diagnosis not present

## 2023-05-27 DIAGNOSIS — J3089 Other allergic rhinitis: Secondary | ICD-10-CM

## 2023-05-27 NOTE — Progress Notes (Signed)
 Dear Dr. Oneta Rack, Here is my assessment for our mutual patient, Gerald [name]. Thank you for allowing me the opportunity to care for your patient. Please do not hesitate to contact me should you have any other questions. Sincerely, Dr. Jovita Kussmaul  Otolaryngology Clinic Note Referring provider: Dr. Oneta Rack HPI:  Gerald [name] is a 68 y.o. male kindly referred by Dr. Oneta Rack for evaluation of post nasal drip and nasal complaints.  Initial visit (02/2023): He reports that he cleaned out his mother's house in January 2024, and then started to have some post nasal drip, nasal congestion, throat clearing and irritation. He reports that he had it until about mid-May when it improved. He now reports that if he is in a dusty environment, he has some PND and throat clearing and some dysphonia. No itchy eyes or nose from this.  No facial pressure/pain, maybe some hyposmia, no discolored drainage, or frequent sinus infections (maybe once a year).  He has seen Dr. Judeth Horn (Pulm), and his primary care doctor, who prescribed Prednisone and Antibiotics (April 2024). He has tried Careers adviser, Ipratropium (uses daily), and Flonase. Sprays help a fair amount. He also uses saline spray. He has not tried nasal saline rinses. No recent allergy testing.  He reports he has had history of Asthma and Allergies (unable to review records) and did get allergy testing early in his life and got allergy shots at [facility]. This did help him significantly.  No reflux symptoms.  He uses Wixela perhaps 2x/week when he is having an exacerbation. Has not had to use albuterol --------------------------------------------------------- 05/27/2023 Since last visit, he has done better. Congestion and PND are better, he did see allergy and had allergy testing which was negative. Using flonase PRN, ipratropium, astelin, and xyzal. Stopped singulair. No infections.  -----------------------------------------------------------------  PMHx:  HLD, HTN, Pre-diabetes, Migraines, CAD  H&N Surgery: sinus surgery 1989 Personal or FHx of bleeding dz or anesthesia difficulty: no   AP/AC: ASA 81  Tobacco: no. Lives in [place]  Independent Review of Additional Tests or Records:  [facility] 04/2021: unable to review images  Referral notes, pulm notes and labs reviewed   CXR 2024: no opacification/consolidation Dr. Allena Katz (Allergy) 04/30/2023: notes reviewed - rhinitis, congestion, PND; year round. Dx: AR, performed skin testing; continue astelin, atrovent, xyzal; stop singulair. Asthma follow with pulm. Ames Dura, NP (Pulm) 03/26/2023: improved significantly from pulm standpoint, some sneezing; congestion which is worse;; Dx: Asthma, continue wixela PRN, singulair Skin allergy testing 05/06/2023 independently reviewed: negative to grasses, mold, weed, trees CBC 04/14/2023: Eos 201 IgE 2024: 5 PMH/Meds/All/SocHx/FamHx/ROS:   Past Medical History:  Diagnosis Date   Acute medial meniscus tear of right knee 02/07/2021   Asthma    Headache    Hyperlipidemia    Hypertension    Pre-diabetes    no meds   Vitamin D deficiency      Past Surgical History:  Procedure Laterality Date   ESOPHAGOGASTRODUODENOSCOPY     KNEE ARTHROSCOPY WITH MEDIAL MENISECTOMY Right 02/20/2021   Procedure: KNEE ARTHROSCOPY WITH MEDIAL MENISECTOMY;  Surgeon: Salvatore Marvel, MD;  Location: [facility];  Service: Orthopedics;  Laterality: Right;   KNEE SURGERY     left   ORIF ACETABULAR FRACTURE     left arm 2 times    Family History  Problem Relation Age of Onset   Hypertension Mother    Hyperlipidemia Mother    Diabetes Mother    Allergic rhinitis Father    Hypertension Father  Social Connections: Not on file      Current Outpatient Medications:    albuterol (VENTOLIN HFA) 108 (90 Base) MCG/ACT inhaler, Inhale 2 puffs into the lungs every 6 (six) hours as needed for wheezing or shortness of breath., Disp: 8 g, Rfl: 2    ALPRAZolam (XANAX) 0.5 MG tablet, TAKE 1/2-1 TAB DAILY IF NEEDED FOR ANXIETY ATTACK (MAX 5 DAYS/WEEK TO AVOID ADDICTION/DEMENTIA), Disp: 30 tablet, Rfl: 0   Ascorbic Acid (VITAMIN C) 1000 MG tablet, Take 1,000 mg by mouth 2 (two) times daily., Disp: , Rfl:    aspirin EC 81 MG tablet, Take 81 mg by mouth daily. Swallow whole., Disp: , Rfl:    azelastine (ASTELIN) 0.1 % nasal spray, Place 2 sprays into both nostrils 2 (two) times daily as needed for rhinitis. Use in each nostril as directed, Disp: 30 mL, Rfl: 5   Cholecalciferol (VITAMIN D-3) 5000 UNITS TABS, Take 5,000 Units by mouth daily. , Disp: , Rfl:    clobetasol cream (TEMOVATE) 0.05 %, APPLY TO RASH TWICE DAILY AS NEEDED, Disp: 60 g, Rfl: 3   cloNIDine (CATAPRES) 0.1 MG tablet, TAKE 1-2 TABS BY MOUTH. MAY REPEAT EVERY 45-60 MINS AS NEEDED, MAX 4 DOSES, UNTIL SYMPTOMS RESOLVE, Disp: 90 tablet, Rfl: 0   [employer] STOOL SOFTENER/LAXATIVE 8.6-50 MG tablet, Take 1 tablet by mouth 2 (two) times daily., Disp: , Rfl:    Flaxseed, Linseed, (FLAX SEED OIL PO), Take 1,000 mg by mouth 3 (three) times daily. , Disp: , Rfl:    fluticasone (FLONASE) 50 MCG/ACT nasal spray, Place 2 sprays into both nostrils daily., Disp: 16 g, Rfl: 5   Glucosamine-Chondroitin (GLUCOSAMINE CHONDR COMPLEX PO), Take by mouth. Take 2 tablets daily, Disp: , Rfl:    ibuprofen (ADVIL) 600 MG tablet, Take 1 tablet (600 mg total) by mouth every 6 (six) hours as needed., Disp: 30 tablet, Rfl: 0   ipratropium (ATROVENT) 0.06 % nasal spray, Place 1 spray into both nostrils 3 (three) times daily as needed for rhinitis. Use 1 to 2 sprays each nostril 2 to 3 x /day as needed, Disp: 15 mL, Rfl: 5   levocetirizine (XYZAL) 5 MG tablet, Take 1 tablet (5 mg total) by mouth every evening., Disp: 30 tablet, Rfl: 5   Magnesium 500 MG TABS, Take 1 tablet (500 mg total) by mouth daily., Disp: 30 tablet, Rfl: 0   Melatonin 10 MG CAPS, Take by mouth Nightly., Disp: , Rfl:    Omega-3 Fatty Acids (FISH OIL  PO), Take 1,000 mg by mouth 3 (three) times daily. , Disp: , Rfl:    pregabalin (LYRICA) 200 MG capsule, Take  1 capsule  2 to 3 x /day  for Pain, Disp: 270 capsule, Rfl: 1   Probiotic Product (ALIGN) 4 MG CAPS, Take 1 capsule daily for Probiotic Benefit, Disp: 90 capsule, Rfl: 1   rosuvastatin (CRESTOR) 40 MG tablet, TAKE 1 TABLET BY MOUTH EVERY DAY FOR CHOLESTEROL, Disp: 90 tablet, Rfl: 1   sildenafil (VIAGRA) 100 MG tablet, Take 100 mg by mouth as needed., Disp: , Rfl:    Testosterone 10 MG/ACT (2%) GEL, APPLY THE CONTENTS OF 4    PUMP ACTUATIONS DAILY TO   THIGHS, Disp: 360 g, Rfl: 1   traZODone (DESYREL) 150 MG tablet, Take  1/2 to 1 tablet   1 to 2 hours  before Bedtime  as needed for Sleep, Disp: 90 tablet, Rfl: 3   vitamin B-12 (CYANOCOBALAMIN) 100 MCG tablet, Take 100 mcg by mouth  daily., Disp: , Rfl:    WIXELA INHUB 500-50 MCG/ACT AEPB, INHALE 1 PUFF INTO THE LUNGS IN THE MORNING AND AT BEDTIME., Disp: 60 each, Rfl: 3   zolmitriptan (ZOMIG) 5 MG tablet, TAKE 1 TABLET BY MOUTH AT ONSET OF HEADACHE; MAY REPEAT 1 TABLET IN 2 HOURS IF NEEDED., Disp: 24 tablet, Rfl: 2   amLODipine (NORVASC) 5 MG tablet, Take 1 tablet (5 mg total) by mouth daily., Disp: 90 tablet, Rfl: 3   fexofenadine (ALLEGRA) 180 MG tablet, Take by mouth., Disp: , Rfl:    montelukast (SINGULAIR) 10 MG tablet, TAKE 1 TABLET BY MOUTH EVERY DAY, Disp: 90 tablet, Rfl: 3   Physical Exam:   BP 123/76 (BP Location: Right Arm, Patient Position: Sitting, Cuff Size: Normal)   Pulse 68   SpO2 94%   Salient findings:  CN II-XII intact  Bilateral EAC clear and TM intact with well pneumatized middle ear spaces Anterior rhinoscopy: Septum relatively midline; bilateral inferior turbinates without significant hypertrophy No lesions of oral cavity/oropharynx No respiratory distress or stridor  Seprately Identifiable Procedures:  PROCEDURE: Bilateral Diagnostic Rigid Nasal Endoscopy (Prior, not today)  Description of Procedure:   Patient was identified. A rigid 30 degree endoscope was utilized to evaluate the sinonasal cavities, mucosa, sinus ostia and turbinates and septum.  Overall, signs of mucosal inflammation are not noted.  No mucopurulence, polyps, or masses noted.   Right Middle meatus: clear Right SE Recess: clear Left MM: clear Left SE Recess: clear  CPT CODE -- 16109 - Mod 25   Impression & Plans  Gerald [name] is a 68 y.o. male with: 1. Non-seasonal allergic rhinitis due to other allergic trigger   2. Nasal congestion   3. Nasal septal deviation   4. Post-nasal drip    Improved on sprays. He is not having chronic sinusitis symptoms, and is improving on nasal sprays. He has also derived prior benefit from immunotherapy/allergy eval but allergy eval negative. As such, could also be non-allergic cause As such, we again discussed his options: Observation 2. Continued medical management 3. Further imaging (post-treatment CT) He opted for continued medical mgmt given improvement - Continue Saline Rinses Daily - Continue Flonase 2 puffs BID - Continue ipratropium BID - Continue Astelin BID (see below)  We discussed f/u, and given improvement he'd like to f/u PRN  See below regarding exact medications prescribed this encounter including dosages and route: No orders of the defined types were placed in this encounter.     Thank you for allowing me the opportunity to care for your patient. Please do not hesitate to contact me should you have any other questions.  Sincerely, Jovita Kussmaul, MD Otolaryngologist (ENT), [facility] Phone: [phone] Fax: [phone]  05/30/2023, 8:48 AM   MDM:  Level 4 Complexity/Problems addressed: mod - multiple chronic stable problems Data complexity: mod - independent review of notes, labs, tests - Morbidity: low - Prescription Drug prescribed or managed: no

## 2023-05-30 ENCOUNTER — Encounter (INDEPENDENT_AMBULATORY_CARE_PROVIDER_SITE_OTHER): Payer: Self-pay | Admitting: Otolaryngology

## 2023-06-22 DIAGNOSIS — S30860A Insect bite (nonvenomous) of lower back and pelvis, initial encounter: Secondary | ICD-10-CM | POA: Diagnosis not present

## 2023-06-22 DIAGNOSIS — D2262 Melanocytic nevi of left upper limb, including shoulder: Secondary | ICD-10-CM | POA: Diagnosis not present

## 2023-06-22 DIAGNOSIS — L538 Other specified erythematous conditions: Secondary | ICD-10-CM | POA: Diagnosis not present

## 2023-06-22 DIAGNOSIS — D225 Melanocytic nevi of trunk: Secondary | ICD-10-CM | POA: Diagnosis not present

## 2023-06-22 DIAGNOSIS — D2261 Melanocytic nevi of right upper limb, including shoulder: Secondary | ICD-10-CM | POA: Diagnosis not present

## 2023-06-22 DIAGNOSIS — D485 Neoplasm of uncertain behavior of skin: Secondary | ICD-10-CM | POA: Diagnosis not present

## 2023-06-22 DIAGNOSIS — D2271 Melanocytic nevi of right lower limb, including hip: Secondary | ICD-10-CM | POA: Diagnosis not present

## 2023-06-22 DIAGNOSIS — L570 Actinic keratosis: Secondary | ICD-10-CM | POA: Diagnosis not present

## 2023-06-22 DIAGNOSIS — L814 Other melanin hyperpigmentation: Secondary | ICD-10-CM | POA: Diagnosis not present

## 2023-06-22 DIAGNOSIS — L820 Inflamed seborrheic keratosis: Secondary | ICD-10-CM | POA: Diagnosis not present

## 2023-06-22 DIAGNOSIS — D2272 Melanocytic nevi of left lower limb, including hip: Secondary | ICD-10-CM | POA: Diagnosis not present

## 2023-07-01 ENCOUNTER — Encounter: Payer: Medicare HMO | Admitting: Internal Medicine

## 2023-07-08 ENCOUNTER — Encounter: Payer: Medicare HMO | Admitting: Internal Medicine

## 2023-08-03 ENCOUNTER — Other Ambulatory Visit: Payer: Self-pay | Admitting: Internal Medicine

## 2023-08-03 DIAGNOSIS — G4700 Insomnia, unspecified: Secondary | ICD-10-CM

## 2023-08-04 ENCOUNTER — Other Ambulatory Visit: Payer: Self-pay

## 2023-08-04 ENCOUNTER — Ambulatory Visit: Payer: Medicare HMO | Admitting: Internal Medicine

## 2023-08-04 ENCOUNTER — Encounter: Payer: Self-pay | Admitting: Internal Medicine

## 2023-08-04 VITALS — BP 138/80 | HR 54 | Temp 98.5°F | Wt 188.1 lb

## 2023-08-04 DIAGNOSIS — J4540 Moderate persistent asthma, uncomplicated: Secondary | ICD-10-CM

## 2023-08-04 DIAGNOSIS — J310 Chronic rhinitis: Secondary | ICD-10-CM | POA: Diagnosis not present

## 2023-08-04 MED ORDER — AZELASTINE HCL 0.1 % NA SOLN: 1.0000 | Freq: Two times a day (BID) | NASAL | 3 refills | Status: AC

## 2023-08-04 MED ORDER — LEVOCETIRIZINE DIHYDROCHLORIDE 5 MG PO TABS: 5.0000 mg | ORAL_TABLET | Freq: Every evening | ORAL | 3 refills | Status: AC

## 2023-08-04 MED ORDER — IPRATROPIUM BROMIDE 0.06 % NA SOLN: 1.0000 | Freq: Three times a day (TID) | NASAL | 11 refills | Status: AC | PRN

## 2023-08-04 MED ORDER — FLUTICASONE PROPIONATE 50 MCG/ACT NA SUSP: 2.0000 | Freq: Every day | NASAL | 3 refills | Status: AC

## 2023-08-04 NOTE — Patient Instructions (Addendum)
 Chronic Rhinitis: - Use nasal saline rinses before nose sprays such as with Neilmed Sinus Rinse.  Use distilled water.  - SPT 04/2023: negative   - Use Flonase  2 sprays each nostril daily.  Aim upward and outward.  - Use Azelastine  1-2 sprays each nostril twice daily. Aim upward and outward. - Use Ipratroprium 1-2 sprays up to three times daily as needed for runny nose. Aim upward and outward. - Use Xyzal  5mg  daily.  Moderate Persistent Asthma: - Continue follow up with Pulmonology.  - Maintenance inhaler: on Wixela 500-50mcg 1-2 puffs daily as needed.  - Rescue inhaler: Albuterol  2 puffs via spacer or 1 vial via nebulizer every 4-6 hours as needed for respiratory symptoms of cough, shortness of breath, or wheezing Asthma control goals:  Full participation in all desired activities (may need albuterol  before activity) Albuterol  use two times or less a week on average (not counting use with activity) Cough interfering with sleep two times or less a month Oral steroids no more than once a year No hospitalizations

## 2023-08-04 NOTE — Progress Notes (Signed)
 FOLLOW UP Date of Service/Encounter:  08/04/23   Subjective:  Gerald [name] (DOB: Dec 13, 1955) is a 68 y.o. male who [facility] for follow up for chronic rhinitis.  Followed by Pulm for moderate persistent asthma on PRN Wixela.   History obtained from: chart review and patient. Last visit was on 05/06/2023 with me for skin testing and was negative; discussed he has chronic rhinitis.  Use Flonase , PRN Azelastine /Ipratropium, Xyzal  PRN. Okay to stop Singulair .   Reports on and off runny and post nasal drip. Some improvement with nasal sprays but still has it throughout the year.  Did note when he went to [facility], he had very minimal symptoms.  Uses Flonase /Ipratropium PRN and Azelastine  1 spray twice daily and Xyzal  daily.  Stopped Singulair /montelukast , no changes noted.   Followed by Pulm, asthma has done well. Only uses Wixela a few times a week or less.  Not much albuterol  use. No ER visits/oral prednisone  use since last visit.   Past Medical History: Past Medical History:  Diagnosis Date   Acute medial meniscus tear of right knee 02/07/2021   Asthma    Headache    Hyperlipidemia    Hypertension    Pre-diabetes    no meds   Vitamin D  deficiency     Objective:  BP 138/80 (BP Location: Left Arm, Patient Position: Sitting, Cuff Size: Normal)   Pulse (!) 54   Temp 98.5 F (36.9 C) (Temporal)   Wt 188 lb 1.6 oz (85.3 kg)   SpO2 97%   BMI 31.30 kg/m  Body mass index is 31.3 kg/m. Physical Exam: GEN: alert, well developed HEENT: clear conjunctiva, nose without inferior turbinate hypertrophy, pink nasal mucosa, no rhinorrhea, + cobblestoning HEART: regular rate and rhythm, no murmur LUNGS: clear to auscultation bilaterally, no coughing, unlabored respiration SKIN: no rashes or lesions  Spirometry:  Tracings reviewed. His effort: Good reproducible efforts. FVC: 4.02L, 110% predicted FEV1: 2.99L, 106% predicted FEV1/FVC ratio:  74% Interpretation: Spirometry consistent with normal pattern.  Please see scanned spirometry results for details.   Assessment:   1. Chronic rhinitis   2. Moderate persistent asthma without complication     Plan/Recommendations:  Chronic Rhinitis: - Improved.  Discussed trying daily Flonase  rather than PRN.  - Use nasal saline rinses before nose sprays such as with Neilmed Sinus Rinse.  Use distilled water.  - SPT 04/2023: negative   - Use Flonase  2 sprays each nostril daily.  Aim upward and outward.  - Use Azelastine  1-2 sprays each nostril twice daily. Aim upward and outward. - Use Ipratroprium 1-2 sprays up to three times daily as needed for runny nose. Aim upward and outward. - Use Xyzal  5mg  daily.  Moderate Persistent Asthma: - Continue follow up with Pulmonology.  Well controlled. Normal spirometry.  - Maintenance inhaler: on Wixela 500-50mcg1-2 puffs daily as needed.  - Rescue inhaler: Albuterol  2 puffs via spacer or 1 vial via nebulizer every 4-6 hours as needed for respiratory symptoms of cough, shortness of breath, or wheezing Asthma control goals:  Full participation in all desired activities (may need albuterol  before activity) Albuterol  use two times or less a week on average (not counting use with activity) Cough interfering with sleep two times or less a month Oral steroids no more than once a year No hospitalizations    Return in about 1 year (around 08/03/2024).  Kristen Petri, MD [facility]  

## 2023-10-04 ENCOUNTER — Ambulatory Visit: Payer: Medicare HMO | Admitting: Nurse Practitioner

## 2023-10-07 ENCOUNTER — Other Ambulatory Visit: Payer: Self-pay | Admitting: Cardiology

## 2023-10-07 DIAGNOSIS — E782 Mixed hyperlipidemia: Secondary | ICD-10-CM

## 2023-10-11 ENCOUNTER — Ambulatory Visit: Payer: Medicare HMO | Admitting: Nurse Practitioner

## 2023-11-09 ENCOUNTER — Telehealth: Payer: Self-pay | Admitting: Cardiology

## 2023-11-09 DIAGNOSIS — E782 Mixed hyperlipidemia: Secondary | ICD-10-CM

## 2023-11-09 NOTE — Telephone Encounter (Signed)
*  STAT* If patient is at the pharmacy, call can be transferred to refill team.   1. Which medications need to be refilled? (please list name of each medication and dose if known) rosuvastatin  (CRESTOR ) 40 MG tablet    2. Would you like to learn more about the convenience, safety, & potential cost savings by using the [facility]?      3. Are you open to using the [facility] (Type [facility].  ).   4. Which pharmacy/location (including street and city if local pharmacy) is medication to be sent to? [id number] - [place], [place] - [address]    5. Do they need a 30 day or 90 day supply? 90 day

## 2023-11-10 MED ORDER — ROSUVASTATIN CALCIUM 40 MG PO TABS
ORAL_TABLET | ORAL | 0 refills | Status: DC
Start: 2023-11-10 — End: 2024-02-07

## 2023-11-10 NOTE — Telephone Encounter (Signed)
 Refill for Rosuvastatin  has been sent to pt's pharmacy as requested below.

## 2024-01-13 ENCOUNTER — Ambulatory Visit: Admitting: Pulmonary Disease

## 2024-01-13 ENCOUNTER — Encounter: Payer: Self-pay | Admitting: Pulmonary Disease

## 2024-01-13 VITALS — BP 177/100 | HR 94 | Temp 97.8°F | Ht 67.0 in | Wt 193.0 lb

## 2024-01-13 DIAGNOSIS — R053 Chronic cough: Secondary | ICD-10-CM

## 2024-01-13 NOTE — Patient Instructions (Signed)
 Is nice to see you again  I am glad you are doing well  Continue Advair as needed as you are  Please let me know if symptoms worsen, I am happy to intervene and reevaluate at any time  Let me know if you need any refills  Return to clinic in 1 year or sooner as needed with Dr. Annella

## 2024-01-13 NOTE — Progress Notes (Signed)
 @Patient  ID: Gerald [name], male    DOB: 1955-07-03, 68 y.o.   MRN: [id number]  No chief complaint on file.   Referring provider: No ref. provider found  HPI:   68 y.o. man whom we are seeing for evaluation of ongoing cough and chest tightness.  Most recent pulmonary note from NP reviewed.  [name].  Overall, doing quite well.  Symptoms remain minimal.  Using Wixela as needed.  Rarely.  We picked up viruses or little colds.  But again this is rare.  No shortness of breath etc.  Cough is minimal to none outside of illnesses.  Continues to exercise regularly.  HPI initial visit: Asthma as a child.  Grew out of this about age 44 or so.  Mild symptoms with exertion and high school otherwise no issues thereafter.  Mother recently passed away.  Started cleaning out her house particularly enclosed porch in January.  And her hair.  He had allergy  testing in the past and he is allergic to it.  Other dust, cockroach debris etc.  Symptoms had intermittent chest tightness, cough.  Productive of mild amount of phlegm.  Progressively slightly improved over time.  But lingering now for couple months.  Trial of prednisone  with some improvement but not much.  Use albuterol  with some improvement.  Uses it intermittently.  Has nasal congestion as well with this.  Sinus congestion.  Treated with Flonase , montelukast  etc. with improvement in nasal congestion, rhinorrhea.  Not a major issue now.  Not on maintenance inhalers.Per my review it sounds acute on Trelegy at 1 point in time but not more.  CT coronary scan 03/2021 that showed small less than 4 mm bilateral nodule x 2, otherwise clear.  Echocardiogram 12/23  shows grade 1 diastolic function, moderately dilated LA, trivial MVR, right-sided structures and pressures reported as normal.  PMH: Hypertension, hyperlipidemia, childhood asthma, seasonal allergies, headaches Surgical history: Sinus surgery March 1989 Family  history: History of childhood asthma presents in arteriosus Social history: Never smoker, retired, lives in [place] / Pulmonary Flowsheets:   ACT:      No data to display          MMRC:     No data to display          Epworth:      No data to display          Tests:   FENO:  No results found for: NITRICOXIDE  PFT:     No data to display          WALK:      No data to display          Imaging: Personally reviewed and as per EMR and discussion in this note No results found.  Lab Results: Personally reviewed CBC    Component Value Date/Time   WBC 5.3 04/14/2023 1232   RBC 5.09 04/14/2023 1232   HGB 11.6 (L) 04/14/2023 1232   HCT 39.7 04/14/2023 1232   PLT 322 04/14/2023 1232   MCV 78.0 (L) 04/14/2023 1232   MCH 22.8 (L) 04/14/2023 1232   MCHC 29.2 (L) 04/14/2023 1232   RDW 14.7 04/14/2023 1232   LYMPHSABS 757 (L) 12/31/2022 1121   MONOABS 611 10/15/2016 1123   EOSABS 201 04/14/2023 1232   BASOSABS 48 04/14/2023 1232    BMET    Component Value Date/Time   NA 141 04/14/2023 1232   K 4.6 04/14/2023 1232  CL 105 04/14/2023 1232   CO2 26 04/14/2023 1232   GLUCOSE 92 04/14/2023 1232   BUN 22 04/14/2023 1232   CREATININE 0.89 04/14/2023 1232   CALCIUM  9.3 04/14/2023 1232   GFRNONAA 68 08/08/2020 1215   GFRAA 79 08/08/2020 1215    BNP No results found for: BNP  ProBNP No results found for: PROBNP  Specialty Problems   None   No Known Allergies  Immunization History  Administered Date(s) Administered   INFLUENZA, HIGH DOSE SEASONAL PF 02/13/2022, 12/31/2022   Influenza Inj Mdck Quad Pf 01/04/2019   Influenza Inj Mdck Quad With Preservative 12/27/2017   Influenza Split 01/08/2014   Influenza Whole 01/05/2013   Influenza,inj,Quad PF,6+ Mos 01/15/2021   Influenza-Unspecified 01/05/2015   Moderna SARS-COV2 Booster Vaccination 02/13/2022   PFIZER(Purple Top)SARS-COV-2 Vaccination 06/05/2019,  06/27/2019, 03/09/2020   PNEUMOCOCCAL CONJUGATE-20 08/22/2021   PPD Test 01/08/2014, 01/14/2015, 12/12/2015, 01/12/2017, 02/08/2018, 04/27/2019, 05/08/2020   Pfizer Covid-19 Vaccine Bivalent Booster 57yrs & up 03/06/2021   Respiratory Syncytial Virus Vaccine,Recomb Aduvanted(Arexvy) 02/13/2022   Td 08/22/2021   Tdap 06/15/2008   Unspecified SARS-COV-2 Vaccination 02/16/2023    Past Medical History:  Diagnosis Date   Acute medial meniscus tear of right knee 02/07/2021   Asthma    Headache    Hyperlipidemia    Hypertension    Pre-diabetes    no meds   Vitamin D  deficiency     Tobacco History: Social History   Tobacco Use  Smoking Status Never   Passive exposure: Past  Smokeless Tobacco Never   Counseling given: Not Answered   Continue to not smoke  Outpatient Encounter Medications as of 01/13/2024  Medication Sig   albuterol  (VENTOLIN  HFA) 108 (90 Base) MCG/ACT inhaler Inhale 2 puffs into the lungs every 6 (six) hours as needed for wheezing or shortness of breath.   ALPRAZolam  (XANAX ) 0.5 MG tablet TAKE 1/2-1 TAB DAILY IF NEEDED FOR ANXIETY ATTACK (MAX 5 DAYS/WEEK TO AVOID ADDICTION/DEMENTIA)   amLODipine  (NORVASC ) 5 MG tablet Take 1 tablet (5 mg total) by mouth daily.   Ascorbic Acid (VITAMIN C) 1000 MG tablet Take 1,000 mg by mouth 2 (two) times daily.   aspirin  EC 81 MG tablet Take 81 mg by mouth daily. Swallow whole.   azelastine  (ASTELIN ) 0.1 % nasal spray Place 1 spray into both nostrils 2 (two) times daily. Use in each nostril as directed   Cholecalciferol (VITAMIN D -3) 5000 UNITS TABS Take 5,000 Units by mouth daily.    clobetasol  cream (TEMOVATE ) 0.05 % APPLY TO RASH TWICE DAILY AS NEEDED   cloNIDine  (CATAPRES ) 0.1 MG tablet TAKE 1-2 TABS BY MOUTH. MAY REPEAT EVERY 45-60 MINS AS NEEDED, MAX 4 DOSES, UNTIL SYMPTOMS RESOLVE   [employer] STOOL SOFTENER/LAXATIVE 8.6-50 MG tablet Take 1 tablet by mouth 2 (two) times daily.   Flaxseed, Linseed, (FLAX SEED OIL PO) Take 1,000 mg  by mouth 3 (three) times daily.    fluticasone  (FLONASE ) 50 MCG/ACT nasal spray Place 2 sprays into both nostrils daily.   Glucosamine-Chondroitin (GLUCOSAMINE CHONDR COMPLEX PO) Take by mouth. Take 2 tablets daily   ibuprofen  (ADVIL ) 600 MG tablet Take 1 tablet (600 mg total) by mouth every 6 (six) hours as needed.   ipratropium (ATROVENT ) 0.06 % nasal spray Place 1 spray into both nostrils 3 (three) times daily as needed for rhinitis.   levocetirizine (XYZAL ) 5 MG tablet Take 1 tablet (5 mg total) by mouth every evening.   Magnesium  500 MG TABS Take 1 tablet (500 mg total)  by mouth daily.   Melatonin 10 MG CAPS Take by mouth Nightly.   montelukast  (SINGULAIR ) 10 MG tablet TAKE 1 TABLET BY MOUTH EVERY DAY   Omega-3 Fatty Acids (FISH OIL PO) Take 1,000 mg by mouth 3 (three) times daily.    pregabalin  (LYRICA ) 200 MG capsule Take  1 capsule  2 to 3 x /day  for Pain   Probiotic Product (ALIGN) 4 MG CAPS Take 1 capsule daily for Probiotic Benefit   rosuvastatin  (CRESTOR ) 40 MG tablet TAKE 1 TABLET BY MOUTH EVERY DAY FOR CHOLESTEROL   sildenafil (VIAGRA) 100 MG tablet Take 100 mg by mouth as needed.   Testosterone  10 MG/ACT (2%) GEL APPLY THE CONTENTS OF 4    PUMP ACTUATIONS DAILY TO   THIGHS   traZODone  (DESYREL ) 150 MG tablet Take  1/2 to 1 tablet   1 to 2 hours  before Bedtime  as needed for Sleep   vitamin B-12 (CYANOCOBALAMIN) 100 MCG tablet Take 100 mcg by mouth daily.   WIXELA INHUB 500-50 MCG/ACT AEPB INHALE 1 PUFF INTO THE LUNGS IN THE MORNING AND AT BEDTIME.   zolmitriptan  (ZOMIG ) 5 MG tablet TAKE 1 TABLET BY MOUTH AT ONSET OF HEADACHE; MAY REPEAT 1 TABLET IN 2 HOURS IF NEEDED.   [DISCONTINUED] fexofenadine (ALLEGRA) 180 MG tablet Take by mouth.   No facility-administered encounter medications on file as of 01/13/2024.     Review of Systems  Review of Systems  N/a Physical Exam  BP (!) 177/100   Pulse 94   Temp 97.8 F (36.6 C) (Oral)   Ht 5' 7 (1.702 m)   Wt 193 lb (87.5  kg)   SpO2 96%   BMI 30.23 kg/m   Wt Readings from Last 5 Encounters:  01/13/24 193 lb (87.5 kg)  08/04/23 188 lb 1.6 oz (85.3 kg)  04/30/23 191 lb (86.6 kg)  04/14/23 193 lb 3.2 oz (87.6 kg)  03/26/23 190 lb (86.2 kg)    BMI Readings from Last 5 Encounters:  01/13/24 30.23 kg/m  08/04/23 31.30 kg/m  04/30/23 31.78 kg/m  04/14/23 29.38 kg/m  03/26/23 28.89 kg/m     Physical Exam General: Sitting in chair, no acute distress Eyes: EOMI, no icterus Neck: Supple, no JVP Pulmonary: Clear, normal work of breathing Cardiovascular: Regular rate and rhythm, no edema Abdomen: Bowel sounds present, nondistended MSK: No synovitis, no joint effusion Neuro: Normal gait, no weakness Psych: Normal mood, full affect   Assessment & Plan:   Chronic cough, intermittent tightness of chest/upper airway: Started after exposure to cat dander, other dust while cleaning to see his mother's property.  Gradually improved with time.  Now minimal symptoms.  Using Advair/Wixela high-dose as needed.  Nasal congestion: Continue current regimen via PCP, Flonase , antihistamines etc.  Return in about 1 year (around 01/12/2025) for f/u Dr. Annella.   Donnice JONELLE Annella, MD 01/13/2024

## 2024-01-14 ENCOUNTER — Other Ambulatory Visit: Payer: Self-pay | Admitting: Internal Medicine

## 2024-01-14 DIAGNOSIS — G4700 Insomnia, unspecified: Secondary | ICD-10-CM

## 2024-01-28 ENCOUNTER — Other Ambulatory Visit: Payer: Self-pay | Admitting: Internal Medicine

## 2024-01-28 ENCOUNTER — Telehealth: Payer: Self-pay | Admitting: Cardiology

## 2024-01-28 DIAGNOSIS — G4700 Insomnia, unspecified: Secondary | ICD-10-CM

## 2024-02-02 NOTE — Telephone Encounter (Signed)
 Pt scheduled to see Dr. Sheena 03/27/24.  Refill for Amlodipine  has been sent to pt's pharmacy.

## 2024-02-02 NOTE — Telephone Encounter (Signed)
*  STAT* If patient is at the pharmacy, call can be transferred to refill team.   1. Which medications need to be refilled? (please list name of each medication and dose if known)  amLODipine  (NORVASC ) 5 MG tablet   2. Would you like to learn more about the convenience, safety, & potential cost savings by using the [facility]?   3. Are you open to using the [facility] (Type [facility].  ).  4. Which pharmacy/location (including street and city if local pharmacy) is medication to be sent to? [id number] - [place], [place] - [address]   5. Do they need a 30 day or 90 day supply? 90 day

## 2024-02-05 ENCOUNTER — Other Ambulatory Visit: Payer: Self-pay | Admitting: Cardiology

## 2024-02-05 DIAGNOSIS — E782 Mixed hyperlipidemia: Secondary | ICD-10-CM

## 2024-02-07 ENCOUNTER — Telehealth: Payer: Self-pay | Admitting: Cardiology

## 2024-02-07 DIAGNOSIS — E782 Mixed hyperlipidemia: Secondary | ICD-10-CM

## 2024-02-07 MED ORDER — ROSUVASTATIN CALCIUM 40 MG PO TABS: ORAL_TABLET | ORAL | 0 refills | Status: AC

## 2024-02-07 MED ORDER — ROSUVASTATIN CALCIUM 40 MG PO TABS: ORAL_TABLET | ORAL | 0 refills | Status: DC

## 2024-02-07 NOTE — Telephone Encounter (Signed)
 Pt scheduled to see Dr. Sheena 03/27/24.  [facility]ill for Rosuvastatin  [facility] [place] [place].

## 2024-02-07 NOTE — Telephone Encounter (Signed)
*  STAT* If patient is at the pharmacy, call can be transferred to refill team.   1. Which medications need to be refilled? (please list name of each medication and dose if known)   rosuvastatin  (CRESTOR ) 40 MG tablet    2. Which pharmacy/location (including street and city if local pharmacy) is medication to be sent to?  [id number] - [place], [place] - [address]    [id number] - [place], [place] - [address]     3. Do they need a 30 day or 90 day supply? 60 day supply to pharmacy in [place] and 30 day supply to pharmacy in [facility].    Pt is out of medication and has office visit on 03/27/24.

## 2024-02-07 NOTE — Addendum Note (Signed)
 Addended by: DARIO IZETTA CROME on: 02/07/2024 09:12 AM   Modules accepted: Orders

## 2024-03-09 ENCOUNTER — Encounter: Payer: Self-pay | Admitting: Cardiology

## 2024-03-24 ENCOUNTER — Telehealth: Payer: Self-pay | Admitting: Cardiology

## 2024-03-24 NOTE — Telephone Encounter (Signed)
 Pt calling in regards to updating us  that he is switching to a [facility] doctor in [place] closer to home. Also stated he was unsatisfied with communication via MyChart. No other current needs at this time. Assured patient we were here if he needed anything further and apologized for any inconvenience. Verbalized understanding.

## 2024-03-24 NOTE — Telephone Encounter (Signed)
 Pt called to cancel appt and state he will be transitioning his care to a location closer to home. Pt also wanted to let the office know part of his decision for leaving the practice was not receiving advisory back after a mychart exchange about medication management.

## 2024-03-27 ENCOUNTER — Ambulatory Visit: Admitting: Cardiology

## 2024-03-27 ENCOUNTER — Other Ambulatory Visit: Payer: Self-pay

## 2024-03-27 MED ORDER — AMLODIPINE BESYLATE 10 MG PO TABS: 10.0000 mg | ORAL_TABLET | Freq: Every day | ORAL | 3 refills | Status: AC

## 2024-03-27 NOTE — Progress Notes (Signed)
 Amlodipine  10 mg sent to preferred pharmacy.

## 2024-05-02 ENCOUNTER — Other Ambulatory Visit: Payer: Self-pay | Admitting: Cardiology

## 2024-05-07 ENCOUNTER — Other Ambulatory Visit: Payer: Self-pay | Admitting: Cardiology

## 2024-05-07 DIAGNOSIS — E782 Mixed hyperlipidemia: Secondary | ICD-10-CM

## 2024-08-04 ENCOUNTER — Ambulatory Visit: Admitting: Internal Medicine
